# Patient Record
Sex: Male | Born: 1945 | Race: White | Hispanic: No | Marital: Married | Smoking: Former smoker
Health system: Southern US, Community
[De-identification: ages and names within clinical notes are randomized; demographics above are authoritative.]

## PROBLEM LIST (undated history)

## (undated) DIAGNOSIS — G63 Polyneuropathy in diseases classified elsewhere: Secondary | ICD-10-CM

## (undated) DIAGNOSIS — D518 Other vitamin B12 deficiency anemias: Secondary | ICD-10-CM

## (undated) DIAGNOSIS — I739 Peripheral vascular disease, unspecified: Secondary | ICD-10-CM

## (undated) DIAGNOSIS — I4901 Ventricular fibrillation: Secondary | ICD-10-CM

## (undated) DIAGNOSIS — H3530 Unspecified macular degeneration: Secondary | ICD-10-CM

## (undated) DIAGNOSIS — I10 Essential (primary) hypertension: Secondary | ICD-10-CM

## (undated) DIAGNOSIS — I779 Disorder of arteries and arterioles, unspecified: Secondary | ICD-10-CM

## (undated) DIAGNOSIS — E785 Hyperlipidemia, unspecified: Secondary | ICD-10-CM

## (undated) DIAGNOSIS — I255 Ischemic cardiomyopathy: Secondary | ICD-10-CM

## (undated) DIAGNOSIS — G250 Essential tremor: Secondary | ICD-10-CM

## (undated) DIAGNOSIS — I252 Old myocardial infarction: Secondary | ICD-10-CM

## (undated) DIAGNOSIS — I2510 Atherosclerotic heart disease of native coronary artery without angina pectoris: Secondary | ICD-10-CM

## (undated) HISTORY — DX: Polyneuropathy in diseases classified elsewhere: G63

## (undated) HISTORY — DX: Peripheral vascular disease, unspecified: I73.9

## (undated) HISTORY — DX: Unspecified macular degeneration: H35.30

## (undated) HISTORY — DX: Other vitamin B12 deficiency anemias: D51.8

## (undated) HISTORY — PX: CATARACT EXTRACTION: SUR2

## (undated) HISTORY — DX: Essential tremor: G25.0

## (undated) HISTORY — DX: Ischemic cardiomyopathy: I25.5

## (undated) HISTORY — DX: Hyperlipidemia, unspecified: E78.5

## (undated) HISTORY — DX: Ventricular fibrillation: I49.01

## (undated) HISTORY — DX: Disorder of arteries and arterioles, unspecified: I77.9

## (undated) HISTORY — DX: Old myocardial infarction: I25.2

## (undated) HISTORY — DX: Atherosclerotic heart disease of native coronary artery without angina pectoris: I25.10

## (undated) HISTORY — DX: Essential (primary) hypertension: I10

---

## 2003-03-28 ENCOUNTER — Encounter: Payer: Self-pay | Admitting: Gastroenterology

## 2003-03-28 ENCOUNTER — Encounter: Admission: RE | Admit: 2003-03-28 | Discharge: 2003-03-28 | Payer: Self-pay | Admitting: Gastroenterology

## 2003-04-26 ENCOUNTER — Encounter: Payer: Self-pay | Admitting: Gastroenterology

## 2003-04-26 ENCOUNTER — Ambulatory Visit (HOSPITAL_COMMUNITY): Admission: RE | Admit: 2003-04-26 | Discharge: 2003-04-26 | Payer: Self-pay | Admitting: Gastroenterology

## 2003-06-25 ENCOUNTER — Ambulatory Visit (HOSPITAL_COMMUNITY): Admission: RE | Admit: 2003-06-25 | Discharge: 2003-06-25 | Payer: Self-pay | Admitting: Gastroenterology

## 2003-06-25 ENCOUNTER — Encounter (INDEPENDENT_AMBULATORY_CARE_PROVIDER_SITE_OTHER): Payer: Self-pay

## 2007-08-04 DIAGNOSIS — I252 Old myocardial infarction: Secondary | ICD-10-CM

## 2007-08-04 HISTORY — DX: Old myocardial infarction: I25.2

## 2007-09-12 ENCOUNTER — Ambulatory Visit: Payer: Self-pay | Admitting: Internal Medicine

## 2007-09-12 ENCOUNTER — Ambulatory Visit: Payer: Self-pay | Admitting: Cardiovascular Disease

## 2007-09-12 ENCOUNTER — Encounter: Payer: Self-pay | Admitting: Critical Care Medicine

## 2007-09-12 ENCOUNTER — Inpatient Hospital Stay (HOSPITAL_COMMUNITY): Admission: EM | Admit: 2007-09-12 | Discharge: 2007-10-01 | Payer: Self-pay | Admitting: Emergency Medicine

## 2007-09-12 HISTORY — PX: CARDIAC CATHETERIZATION: SHX172

## 2007-09-14 ENCOUNTER — Encounter: Payer: Self-pay | Admitting: Critical Care Medicine

## 2007-09-20 ENCOUNTER — Ambulatory Visit: Payer: Self-pay | Admitting: Physical Medicine & Rehabilitation

## 2007-10-19 ENCOUNTER — Ambulatory Visit: Payer: Self-pay | Admitting: Cardiovascular Disease

## 2007-11-17 ENCOUNTER — Encounter: Payer: Self-pay | Admitting: Cardiovascular Disease

## 2007-11-17 ENCOUNTER — Ambulatory Visit (HOSPITAL_COMMUNITY): Admission: RE | Admit: 2007-11-17 | Discharge: 2007-11-17 | Payer: Self-pay | Admitting: Cardiovascular Disease

## 2007-11-17 ENCOUNTER — Ambulatory Visit: Payer: Self-pay | Admitting: Cardiology

## 2007-11-17 ENCOUNTER — Ambulatory Visit: Payer: Self-pay | Admitting: Cardiovascular Disease

## 2007-12-02 ENCOUNTER — Ambulatory Visit: Payer: Self-pay | Admitting: Cardiovascular Disease

## 2007-12-02 ENCOUNTER — Ambulatory Visit: Payer: Self-pay | Admitting: Internal Medicine

## 2007-12-02 LAB — CONVERTED CEMR LAB
Calcium: 9.1 mg/dL (ref 8.4–10.5)
Creatinine, Ser: 1 mg/dL (ref 0.4–1.5)
GFR calc Af Amer: 98 mL/min
GFR calc non Af Amer: 81 mL/min
HDL: 34.6 mg/dL — ABNORMAL LOW (ref >39.0)
LDL Cholesterol: 86 mg/dL (ref 0–99)
Total Bilirubin: 0.5 mg/dL (ref 0.3–1.2)
Total CHOL/HDL Ratio: 3.8
Triglycerides: 63 mg/dL (ref 0–149)
VLDL: 13 mg/dL (ref 0–40)

## 2008-01-06 ENCOUNTER — Ambulatory Visit: Payer: Self-pay | Admitting: Cardiovascular Disease

## 2008-04-03 ENCOUNTER — Ambulatory Visit: Payer: Self-pay | Admitting: Cardiovascular Disease

## 2008-04-03 LAB — CONVERTED CEMR LAB
AST: 20 units/L (ref 0–37)
Albumin: 3.4 g/dL — ABNORMAL LOW (ref 3.5–5.2)
BUN: 10 mg/dL (ref 6–23)
Cholesterol: 141 mg/dL (ref 0–200)
Creatinine, Ser: 1 mg/dL (ref 0.4–1.5)
GFR calc Af Amer: 98 mL/min
GFR calc non Af Amer: 81 mL/min
Glucose, Bld: 100 mg/dL — ABNORMAL HIGH (ref 70–99)
LDL Cholesterol: 91 mg/dL (ref 0–99)
Total Protein: 7.4 g/dL (ref 6.0–8.3)
Triglycerides: 113 mg/dL (ref 0–149)
VLDL: 23 mg/dL (ref 0–40)

## 2008-04-10 ENCOUNTER — Ambulatory Visit: Payer: Self-pay | Admitting: Cardiovascular Disease

## 2008-04-16 ENCOUNTER — Ambulatory Visit (HOSPITAL_COMMUNITY): Admission: RE | Admit: 2008-04-16 | Discharge: 2008-04-16 | Payer: Self-pay | Admitting: Cardiovascular Disease

## 2008-04-16 ENCOUNTER — Ambulatory Visit: Payer: Self-pay | Admitting: Cardiology

## 2008-05-09 ENCOUNTER — Ambulatory Visit: Payer: Self-pay | Admitting: Internal Medicine

## 2008-06-27 ENCOUNTER — Ambulatory Visit: Payer: Self-pay | Admitting: Internal Medicine

## 2008-06-27 LAB — CONVERTED CEMR LAB
Basophils Absolute: 0.1 10*3/uL (ref 0.0–0.1)
Basophils Relative: 1.2 % (ref 0.0–3.0)
CO2: 26 meq/L (ref 19–32)
Chloride: 109 meq/L (ref 96–112)
Creatinine, Ser: 0.9 mg/dL (ref 0.4–1.5)
GFR calc non Af Amer: 91 mL/min
Lymphocytes Relative: 29.8 % (ref 12.0–46.0)
MCHC: 34 g/dL (ref 30.0–36.0)
Neutrophils Relative %: 62.8 % (ref 43.0–77.0)
Platelets: 392 10*3/uL (ref 150–400)
Potassium: 4.3 meq/L (ref 3.5–5.1)
Prothrombin Time: 13.2 s (ref 10.9–13.3)
RBC: 4.37 M/uL (ref 4.22–5.81)
Sodium: 141 meq/L (ref 135–145)
aPTT: 29.5 s (ref 21.7–29.8)

## 2008-07-06 ENCOUNTER — Observation Stay (HOSPITAL_COMMUNITY): Admission: RE | Admit: 2008-07-06 | Discharge: 2008-07-07 | Payer: Self-pay | Admitting: Internal Medicine

## 2008-07-06 ENCOUNTER — Ambulatory Visit: Payer: Self-pay | Admitting: Internal Medicine

## 2008-07-06 HISTORY — PX: OTHER SURGICAL HISTORY: SHX169

## 2008-07-11 ENCOUNTER — Ambulatory Visit: Payer: Self-pay | Admitting: Cardiovascular Disease

## 2008-07-23 ENCOUNTER — Ambulatory Visit: Payer: Self-pay

## 2008-09-03 ENCOUNTER — Encounter: Payer: Self-pay | Admitting: Internal Medicine

## 2008-09-13 ENCOUNTER — Ambulatory Visit: Payer: Self-pay | Admitting: Cardiovascular Disease

## 2008-09-13 LAB — CONVERTED CEMR LAB
ALT: 20 units/L (ref 0–53)
AST: 20 units/L (ref 0–37)
Albumin: 3.5 g/dL (ref 3.5–5.2)
Cholesterol: 142 mg/dL (ref 0–200)
HDL: 40.5 mg/dL (ref >39.0)
Total Protein: 7.6 g/dL (ref 6.0–8.3)
Triglycerides: 114 mg/dL (ref 0–149)
VLDL: 23 mg/dL (ref 0–40)

## 2008-09-17 ENCOUNTER — Ambulatory Visit: Payer: Self-pay | Admitting: Cardiovascular Disease

## 2008-09-17 DIAGNOSIS — I2109 ST elevation (STEMI) myocardial infarction involving other coronary artery of anterior wall: Secondary | ICD-10-CM

## 2008-09-17 DIAGNOSIS — I2510 Atherosclerotic heart disease of native coronary artery without angina pectoris: Secondary | ICD-10-CM

## 2008-09-17 DIAGNOSIS — I4901 Ventricular fibrillation: Secondary | ICD-10-CM

## 2008-09-17 DIAGNOSIS — E785 Hyperlipidemia, unspecified: Secondary | ICD-10-CM | POA: Insufficient documentation

## 2008-09-17 DIAGNOSIS — I2589 Other forms of chronic ischemic heart disease: Secondary | ICD-10-CM

## 2008-10-16 ENCOUNTER — Ambulatory Visit: Payer: Self-pay | Admitting: Internal Medicine

## 2008-10-16 ENCOUNTER — Encounter: Payer: Self-pay | Admitting: Internal Medicine

## 2009-01-15 ENCOUNTER — Ambulatory Visit: Payer: Self-pay | Admitting: Internal Medicine

## 2009-02-04 ENCOUNTER — Encounter: Payer: Self-pay | Admitting: Internal Medicine

## 2009-02-11 ENCOUNTER — Telehealth (INDEPENDENT_AMBULATORY_CARE_PROVIDER_SITE_OTHER): Payer: Self-pay | Admitting: *Deleted

## 2009-02-20 ENCOUNTER — Telehealth: Payer: Self-pay | Admitting: Cardiovascular Disease

## 2009-02-22 IMAGING — CR DG CHEST 2V
2 series · 2 of 2 positions shown · non-contrast
Comparison: 09/19/2007

CLINICAL DATA: Heart attack

CHEST - 2 VIEW

[w chest pa]
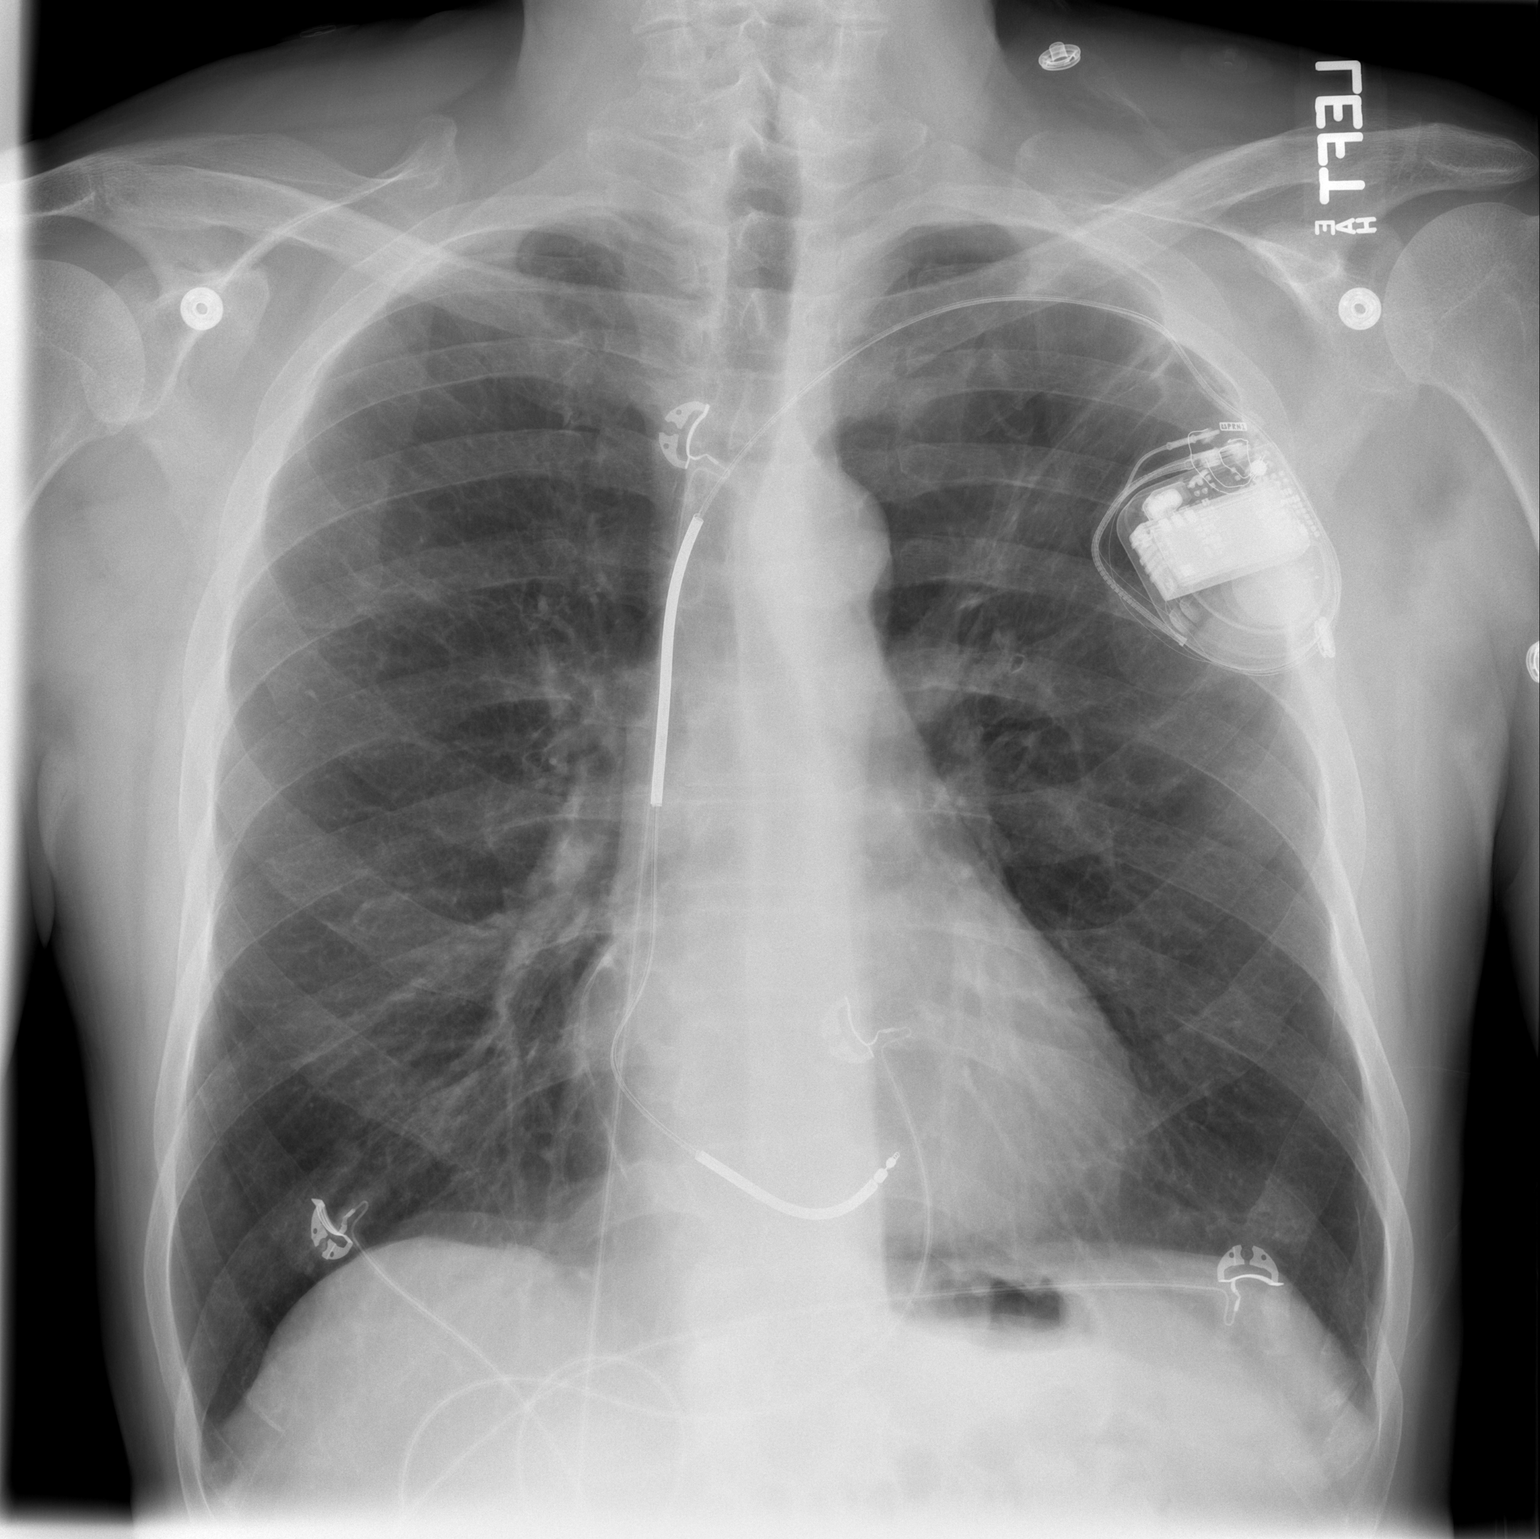

[w chest lat]
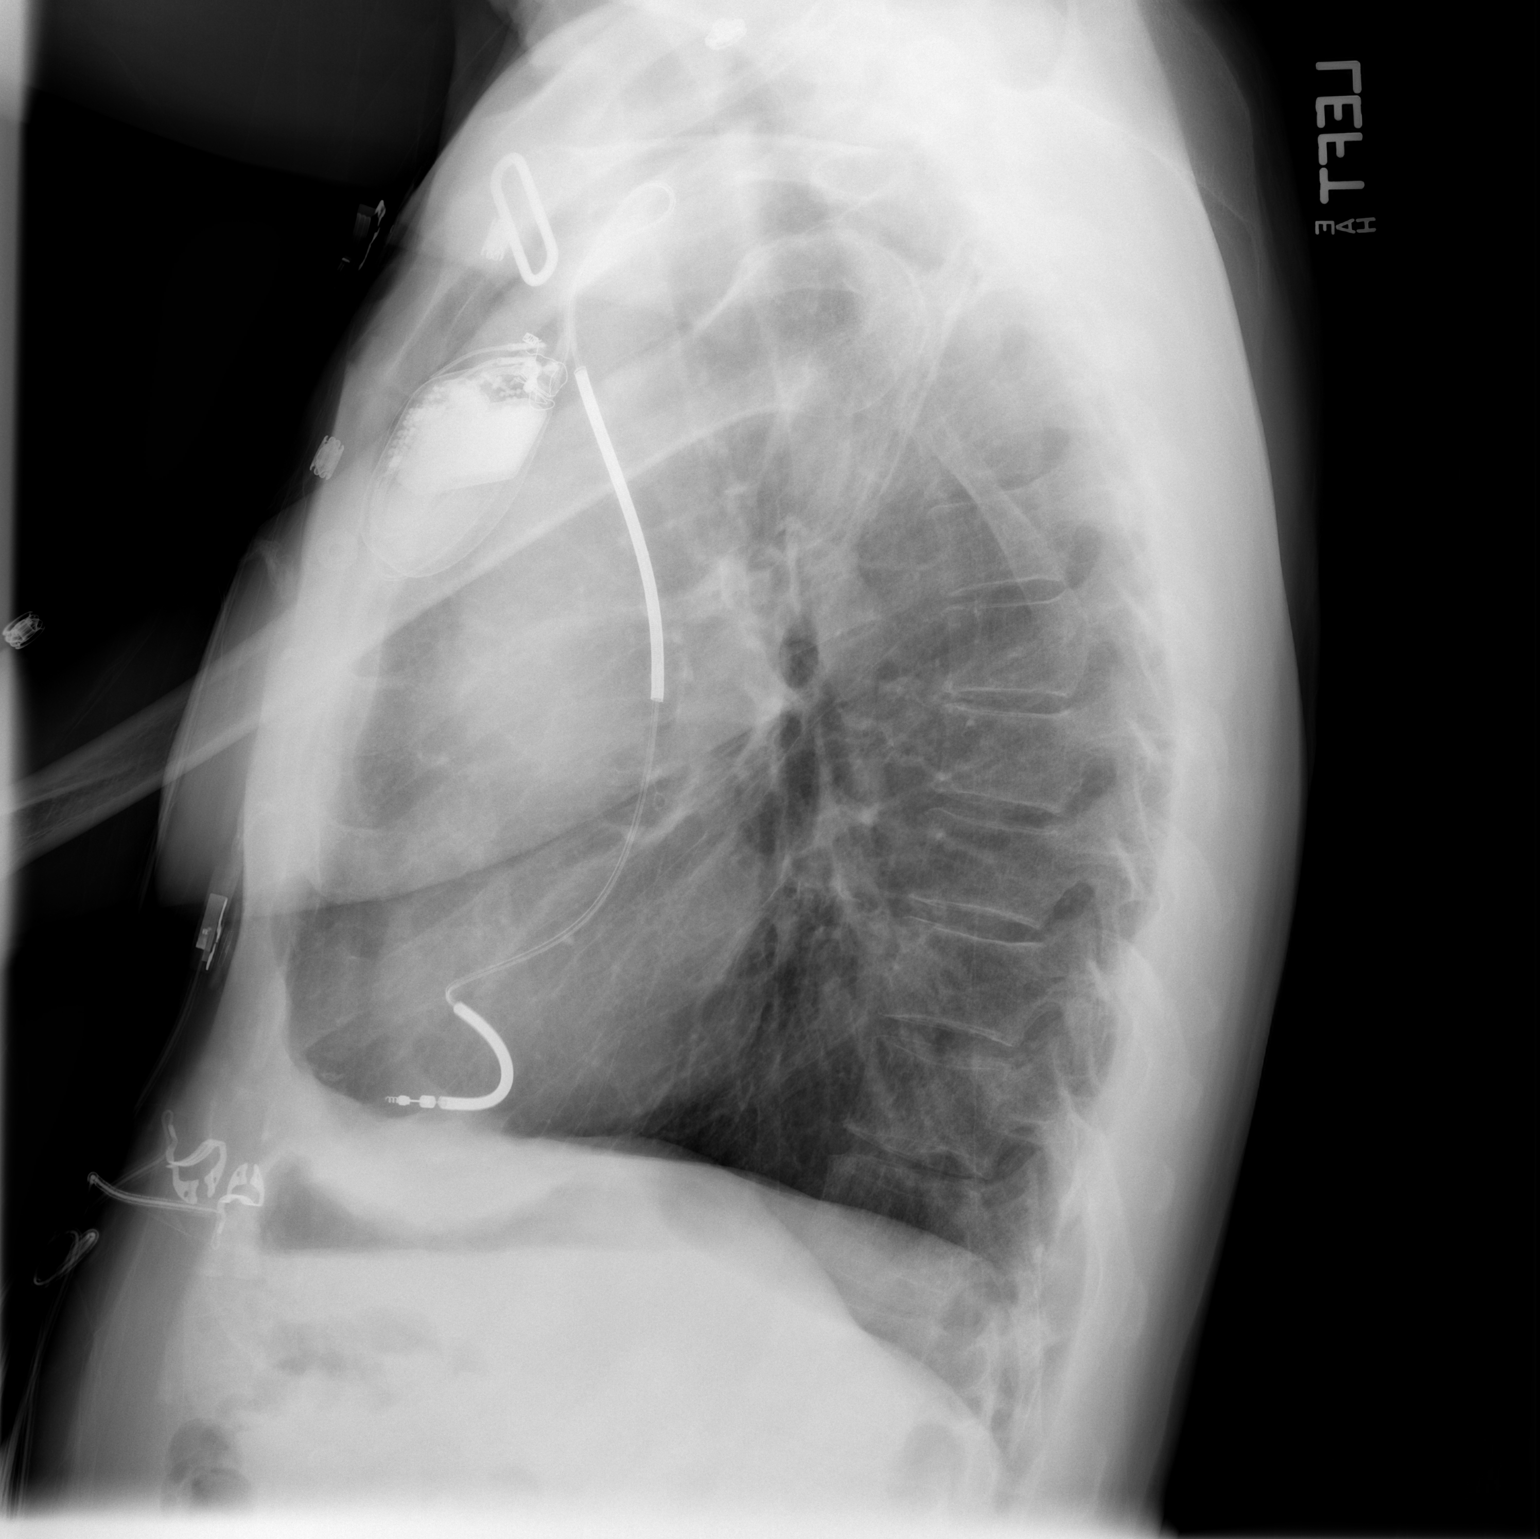

[2 of 2 positions shown; findings below may reference images not displayed]

FINDINGS: A cardiac pacer has been placed via the left subclavian
vein in the interval.  The lead is in satisfactory position.  No
pneumothorax.

Scarring within the left upper lung.  Negative for edema or
effusions.
IMPRESSION: 1.  Pacer is in satisfactory position without pneumothorax.
2.  Negative for pulmonary edema.

## 2009-03-25 ENCOUNTER — Ambulatory Visit: Payer: Self-pay | Admitting: Cardiovascular Disease

## 2009-04-04 ENCOUNTER — Telehealth: Payer: Self-pay | Admitting: Cardiovascular Disease

## 2009-04-15 ENCOUNTER — Telehealth: Payer: Self-pay | Admitting: Cardiovascular Disease

## 2009-04-23 ENCOUNTER — Encounter (INDEPENDENT_AMBULATORY_CARE_PROVIDER_SITE_OTHER): Payer: Self-pay | Admitting: *Deleted

## 2009-04-25 ENCOUNTER — Ambulatory Visit: Payer: Self-pay | Admitting: Internal Medicine

## 2009-07-08 ENCOUNTER — Ambulatory Visit: Payer: Self-pay | Admitting: Cardiovascular Disease

## 2009-07-22 ENCOUNTER — Telehealth (INDEPENDENT_AMBULATORY_CARE_PROVIDER_SITE_OTHER): Payer: Self-pay | Admitting: *Deleted

## 2009-08-06 ENCOUNTER — Telehealth: Payer: Self-pay | Admitting: Cardiovascular Disease

## 2009-09-03 ENCOUNTER — Ambulatory Visit: Payer: Self-pay | Admitting: Internal Medicine

## 2009-09-03 DIAGNOSIS — Z95810 Presence of automatic (implantable) cardiac defibrillator: Secondary | ICD-10-CM | POA: Insufficient documentation

## 2009-10-18 ENCOUNTER — Telehealth (INDEPENDENT_AMBULATORY_CARE_PROVIDER_SITE_OTHER): Payer: Self-pay | Admitting: *Deleted

## 2009-10-31 ENCOUNTER — Encounter: Payer: Self-pay | Admitting: Cardiovascular Disease

## 2009-11-18 ENCOUNTER — Telehealth: Payer: Self-pay | Admitting: Cardiovascular Disease

## 2009-12-03 ENCOUNTER — Encounter: Payer: Self-pay | Admitting: Internal Medicine

## 2009-12-13 ENCOUNTER — Encounter: Payer: Self-pay | Admitting: Internal Medicine

## 2010-01-03 ENCOUNTER — Ambulatory Visit: Payer: Self-pay | Admitting: Cardiovascular Disease

## 2010-01-15 ENCOUNTER — Telehealth: Payer: Self-pay | Admitting: Cardiovascular Disease

## 2010-02-10 ENCOUNTER — Telehealth (INDEPENDENT_AMBULATORY_CARE_PROVIDER_SITE_OTHER): Payer: Self-pay | Admitting: *Deleted

## 2010-03-06 ENCOUNTER — Ambulatory Visit: Payer: Self-pay | Admitting: Internal Medicine

## 2010-03-10 ENCOUNTER — Encounter: Payer: Self-pay | Admitting: Internal Medicine

## 2010-05-07 ENCOUNTER — Telehealth: Payer: Self-pay | Admitting: Cardiovascular Disease

## 2010-05-20 ENCOUNTER — Encounter: Payer: Self-pay | Admitting: Cardiovascular Disease

## 2010-06-05 ENCOUNTER — Ambulatory Visit: Payer: Self-pay | Admitting: Internal Medicine

## 2010-07-03 ENCOUNTER — Encounter (INDEPENDENT_AMBULATORY_CARE_PROVIDER_SITE_OTHER): Payer: Self-pay | Admitting: *Deleted

## 2010-07-15 ENCOUNTER — Ambulatory Visit (HOSPITAL_COMMUNITY)
Admission: RE | Admit: 2010-07-15 | Discharge: 2010-07-15 | Payer: Self-pay | Source: Home / Self Care | Attending: Cardiovascular Disease | Admitting: Cardiovascular Disease

## 2010-07-15 ENCOUNTER — Encounter: Payer: Self-pay | Admitting: Cardiovascular Disease

## 2010-07-15 ENCOUNTER — Ambulatory Visit: Payer: Self-pay | Admitting: Cardiovascular Disease

## 2010-07-15 DIAGNOSIS — R0989 Other specified symptoms and signs involving the circulatory and respiratory systems: Secondary | ICD-10-CM

## 2010-07-15 DIAGNOSIS — R0609 Other forms of dyspnea: Secondary | ICD-10-CM

## 2010-07-18 ENCOUNTER — Ambulatory Visit (HOSPITAL_COMMUNITY)
Admission: RE | Admit: 2010-07-18 | Discharge: 2010-07-18 | Payer: Self-pay | Source: Home / Self Care | Attending: Cardiovascular Disease | Admitting: Cardiovascular Disease

## 2010-07-18 ENCOUNTER — Encounter: Payer: Self-pay | Admitting: Cardiovascular Disease

## 2010-07-18 ENCOUNTER — Ambulatory Visit: Payer: Self-pay

## 2010-07-18 ENCOUNTER — Ambulatory Visit: Payer: Self-pay | Admitting: Cardiovascular Disease

## 2010-07-25 ENCOUNTER — Telehealth: Payer: Self-pay | Admitting: Cardiovascular Disease

## 2010-07-25 LAB — CONVERTED CEMR LAB
ALT: 28 units/L (ref 0–53)
AST: 24 units/L (ref 0–37)
Albumin: 3.6 g/dL (ref 3.5–5.2)
Basophils Absolute: 0 10*3/uL (ref 0.0–0.1)
Calcium: 8.9 mg/dL (ref 8.4–10.5)
Eosinophils Relative: 2.2 % (ref 0.0–5.0)
GFR calc non Af Amer: 68.81 mL/min (ref >60.00)
HCT: 42.3 % (ref 39.0–52.0)
HDL: 39.2 mg/dL (ref >39.00)
Hemoglobin: 14.6 g/dL (ref 13.0–17.0)
Lymphocytes Relative: 31 % (ref 12.0–46.0)
Lymphs Abs: 2.3 10*3/uL (ref 0.7–4.0)
Monocytes Relative: 7.5 % (ref 3.0–12.0)
Neutro Abs: 4.4 10*3/uL (ref 1.4–7.7)
Potassium: 4.2 meq/L (ref 3.5–5.1)
Pro B Natriuretic peptide (BNP): 250.7 pg/mL — ABNORMAL HIGH (ref 0.0–100.0)
RDW: 12.7 % (ref 11.5–14.6)
Sodium: 141 meq/L (ref 135–145)
Total Bilirubin: 0.9 mg/dL (ref 0.3–1.2)
Triglycerides: 125 mg/dL (ref 0.0–149.0)
VLDL: 25 mg/dL (ref 0.0–40.0)
WBC: 7.5 10*3/uL (ref 4.5–10.5)

## 2010-08-12 ENCOUNTER — Telehealth: Payer: Self-pay | Admitting: Cardiovascular Disease

## 2010-09-04 NOTE — Letter (Signed)
Summary: Professional Fee Billing  Professional Fee Billing   Imported By: Lenard Forth 07/25/2010 13:49:20  _____________________________________________________________________  External Attachment:    Type:   Image     Comment:   External Document

## 2010-09-04 NOTE — Progress Notes (Signed)
Summary: Question about medication  Phone Note Call from Patient Call back at Home Phone [phone] Call back at [phone]   Caller: Spouse/ Rhonda Summary of Call: Question about medication( DIgoxin) Initial call taken by: Judie Grieve,  November 18, 2009 2:06 PM  Follow-up for Phone Call        I spoke with the pt's wife and she noticed that the pt's instructions on his Digoxin bottle said to take one-half tablet daily.  The pt has always taken one tablet daily.  She wanted to know if our office changed his dosage.  I told her that we sent a Rx to [facility] on 10/16/09 for the pt to take one daily.  I did look back through the pt's medication list and he has one-half daily listed on some of his office medication lists. The pt states he has never taken a half tablet of Digoxin (pt is in the background).  I corrected the pt's medication list and instructed [name] to have the pt continue taking one tablet daily.   Follow-up by: Julieta Gutting, RN, BSN,  November 18, 2009 2:26 PM    New/Updated Medications: DIGOXIN 0.125 MG TABS (DIGOXIN) Take one tablet by mouth daily

## 2010-09-04 NOTE — Progress Notes (Signed)
Summary: need Plavix ordered  Phone Note Call from Patient Call back at Home Phone [phone]   Caller: Patient Summary of Call: Pt need his Plavix ordered Initial call taken by: Judie Grieve,  May 07, 2010 2:05 PM  Follow-up for Phone Call        Plavix ordered from [facility]. Medication will arrive in 7-10 business days.  Follow-up by: Julieta Gutting, RN, BSN,  May 07, 2010 2:10 PM     Appended Document: need Plavix ordered Plavix arrived in the office and placed at the front desk for pick-up. Message left at home number. Customer PO number [phone], order number [id number].

## 2010-09-04 NOTE — Progress Notes (Signed)
Summary: lab results  Phone Note Call from Patient Call back at [phone]   Caller: Patient Reason for Call: Talk to Nurse, Lab or Test Results Summary of Call: pt calling re echo/chest xray/blood work results  Initial call taken by: Roe Coombs,  July 25, 2010 9:55 AM  Follow-up for Phone Call        I spoke with the pt's wife and gave her the results of x-ray, labs and echo.  Per the pt's wife the pt has been eating foods that are high in salt.  I made her aware that this can cause him to retain fluid and increase his BP.  She has tried to tell the pt that he needs to exercise and change his diet but the pt does not want to make these changes.  Per the pt's lab results Dr Excell Seltzer would like to start the pt on Furosemide 40mg  daily.  New Rx sent to pharmacy.  Follow-up by: Julieta Gutting, RN, BSN,  July 25, 2010 10:37 AM    New/Updated Medications: FUROSEMIDE 40 MG TABS (FUROSEMIDE) take one tablet by mouth daily Prescriptions: FUROSEMIDE 40 MG TABS (FUROSEMIDE) take one tablet by mouth daily  #30 x 6   Entered by:   Julieta Gutting, RN, BSN   Authorized by:   Norva Karvonen, MD   Signed by:   Julieta Gutting, RN, BSN on 07/25/2010   Method used:   Electronically to        [facility] [phone]* (retail)       [address]       [place], [place]  [address]       Ph: [phone]       Fax: [phone]   RxID:   [id number]

## 2010-09-04 NOTE — Letter (Signed)
Summary: Remote Device Check  [facility], Main Office  [address]   [place], [place] [address]   Phone: [phone]  Fax: [phone]     Dec 13, 2009 MRN: [id number]   [name] [address] [place], [place]  [address]   Dear [name],   Your remote transmission was recieved and reviewed by your physician.  All diagnostics were within normal limits for you.  __X___Your next transmission is scheduled for:   03-06-10.  Please transmit at any time this day.  If you have a wireless device your transmission will be sent automatically.   Sincerely,  Vella Kohler

## 2010-09-04 NOTE — Progress Notes (Signed)
Summary: [facility] Plavix  Phone Note Refill Request Call back at Home Phone [phone] Message from:  Patient on August 12, 2010 9:18 AM  Refills Requested: Medication #1:  PLAVIX 75 MG TABS 1 by mouth once daily pt will pick rx. please call pt when rx is in.   Method Requested: Pick up at Office Initial call taken by: Roe Coombs,  August 12, 2010 9:18 AM  Follow-up for Phone Call        Plavix ordered from [facility].  Medication will arrive in 7-10 business days.  Follow-up by: Julieta Gutting, RN, BSN,  August 12, 2010 9:25 AM     Appended Document: [facility] Plavix Plavix arrived in the office and placed at the front desk for pick-up. Pt's wife aware. Order #[id number]

## 2010-09-04 NOTE — Cardiovascular Report (Signed)
Summary: Office Visit Remote   Office Visit Remote   Imported By: Roderic Ovens 03/10/2010 14:50:46  _____________________________________________________________________  External Attachment:    Type:   Image     Comment:   External Document

## 2010-09-04 NOTE — Progress Notes (Signed)
Summary: question re med strength  Phone Note From Pharmacy   Caller: [facility]  [address] [phone]* Request: Speak with Nurse Summary of Call: raisa w[facility] calling re a refill on carvedilol-pt has been getting 12.5 but the rx states 25mg -wants to make sure this is supposes to be increased Initial call taken by: Glynda Jaeger,  January 15, 2010 10:11 AM  Follow-up for Phone Call        The pt's coreg was increased at his most recent appt with Dr Excell Seltzer on 01/03/10.  The pt's pharmacy was notified that Rx is correct.  Follow-up by: Julieta Gutting, RN, BSN,  January 15, 2010 10:24 AM

## 2010-09-04 NOTE — Assessment & Plan Note (Signed)
Summary: f77m   Visit Type:  6 months follow up  CC:  No complaints.  History of Present Illness: This is a 65 year-old gentleman with CAD s/p large anterior MI complicated by out-of-hospital VF arrest. He underwent ICD placement in 2009.  Underwent myoview scan 2009 showing no ischemia. Overall he is doing well. He continues to have some problems with his balance, but has been able to remain active. No chest pain, edema, palps, or lightheadedness. He does have chronic dyspnea on exertion with moderate activity (Class II symptoms).  Current Medications (verified): 1)  Plavix 75 Mg Tabs (Clopidogrel Bisulfate) .Marland Kitchen.. 1 By Mouth Once Daily 2)  Digoxin 0.125 Mg Tabs (Digoxin) .... Take One Tablet By Mouth Daily 3)  Coreg 12.5 Mg Tabs (Carvedilol) .Marland Kitchen.. 1 By Mouth Two Times A Day 4)  Aspirin 81 Mg Tbec (Aspirin) .... Take One Tablet By Mouth Daily 5)  Simvastatin 40 Mg Tabs (Simvastatin) .... Take One Tablet By Mouth Daily At Bedtime 6)  Nitroglycerin 0.4 Mg Subl (Nitroglycerin) .... Place 1 Tablet Under Tongue As Directed 7)  Lisinopril 10 Mg Tabs (Lisinopril) .... Take One Tablet By Mouth Daily  Allergies: 1)  ! Niacin  Past History:  Past medical history reviewed for relevance to current acute and chronic problems.  Past Medical History: Reviewed history from 07/08/2009 and no changes required. HYPERLIPIDEMIA-MIXED (ICD-272.4) CARDIOMYOPATHY, ISCHEMIC (ICD-414.8), with severe residual LV dysfunction, LVEF less than 30%. 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 HEENT: normal Neck: normal carotid upstrokes without bruits, JVP normal Lungs: CTA CV: RRR without murmur or gallop Abd: soft, NT, positive BS, no bruit, no organomegaly Ext: no clubbing, cyanosis, or edema. peripheral pulses 2+ and equal Skin: warm and dry without rash    EKG  Procedure date:  01/03/2010  Findings:      NSR, HR 80 bpm, ST-T wave abnormality consider anterolateral ischemia.   ICD Specifications Following MD:  Lewayne Bunting, MD     ICD Vendor:  [employer]     ICD Model Number:  7232     ICD Serial Number:  [id number] ICD DOI:  07/06/2008     ICD Implanting MD:  Lewayne Bunting, MD  Lead 1:    Location: RV     DOI: 07/06/2008     Model #: [id number]     Serial #: [id number]     Status: active  Indications::  ICM   ICD Follow Up ICD Dependent:  No      Brady Parameters Mode VVI     Lower Rate Limit:  40      Tachy Zones VF:  214     VT:  250 FVT VIA VF     VT1:  176  Impression & Recommendations:  Problem # 1:  CARDIOMYOPATHY, ISCHEMIC (ICD-414.8) Pt stable, appears euvolemic. Increase carvedilol to 25 mg two times a day to achieve goal dose of beta blocker.  His updated medication list for this problem includes:    Plavix 75 Mg Tabs (Clopidogrel bisulfate) .Marland Kitchen... 1 by mouth once daily    Digoxin 0.125 Mg Tabs (Digoxin) .Marland Kitchen... Take one tablet by mouth daily    Carvedilol 25 Mg Tabs (Carvedilol) .Marland Kitchen... Take 1 tablet twice a day    Aspirin 81 Mg Tbec (Aspirin) .Marland Kitchen... Take one tablet by mouth daily    Nitroglycerin 0.4 Mg Subl (Nitroglycerin) .Marland Kitchen... Place 1 tablet under tongue as directed    Lisinopril 10 Mg Tabs (Lisinopril) .Marland Kitchen... Take one tablet by mouth daily  Problem # 2:  CAD, NATIVE VESSEL (ICD-414.01) No angina. Continue current medical therapy.  His updated  medication list for this problem includes:    Plavix 75 Mg Tabs (Clopidogrel bisulfate) .Marland Kitchen... 1 by mouth once daily    Carvedilol 25 Mg Tabs (Carvedilol) .Marland Kitchen... Take 1 tablet twice a day    Aspirin 81 Mg Tbec (Aspirin) .Marland Kitchen... Take one tablet by mouth daily    Nitroglycerin 0.4 Mg Subl (Nitroglycerin) .Marland Kitchen... Place 1 tablet under tongue as directed    Lisinopril 10 Mg Tabs (Lisinopril) .Marland Kitchen... Take one tablet by mouth daily  Orders: EKG w/ Interpretation (93[address])  Problem # 3:  VENTRICULAR FIBRILLATION (ICD-427.41) S/P ICD for primary prevention (initial event occured in setting of AMI). No ICD discharges.  His updated medication list for this problem includes:    Plavix 75 Mg Tabs (Clopidogrel bisulfate) .Marland Kitchen... 1 by mouth once daily    Carvedilol 25 Mg Tabs (Carvedilol) .Marland Kitchen... Take 1 tablet twice a day    Aspirin 81 Mg Tbec (Aspirin) .Marland Kitchen... Take one tablet by mouth daily    Nitroglycerin 0.4 Mg Subl (Nitroglycerin) .Marland Kitchen... Place 1 tablet under tongue as directed    Lisinopril 10 Mg Tabs (Lisinopril) .Marland Kitchen... Take one tablet by mouth daily  Problem # 4:  HYPERLIPIDEMIA-MIXED (ICD-272.4) Followed by PCP. Requested labs be sent for review next time they're drawn.  His updated medication list for this problem includes:    Simvastatin 40 Mg Tabs (Simvastatin) .Marland Kitchen... Take one tablet by mouth daily at bedtime  Patient Instructions: 1)  Your physician recommends that you schedule a follow-up appointment in: 6 months with Dr. Excell Seltzer 2)  Your physician has recommended you make the following change in your medication:  Coreg (Carvedilol) increased to 25mg   1 tablet twice a day Prescriptions: CARVEDILOL 25 MG TABS (CARVEDILOL) Take 1 tablet twice a day  #60 x 6   Entered by:   Lisabeth Devoid RN   Authorized by:   Norva Karvonen, MD   Signed by:   Lisabeth Devoid RN on 01/03/2010   Method used:   Electronically to        [id number] (retail)       [address]       [place], [place]  [address]       Ph: [phone]       Fax: [phone]   RxID:   [id number]

## 2010-09-04 NOTE — Letter (Signed)
Summary: [employer] Patient Product/process development [employer] Patient Assistance Application   Imported By: Roderic Ovens 11/14/2009 14:11:12  _____________________________________________________________________  External Attachment:    Type:   Image     Comment:   External Document

## 2010-09-04 NOTE — Progress Notes (Signed)
Summary: Plavix  Phone Note Outgoing Call   Call placed by: Julieta Gutting, RN, BSN,  August 06, 2009 2:05 PM Call placed to: Patient Summary of Call: I spoke with the pt's wife and made her aware that the pt's plavix had arrived.  Plavix placed at the front desk for pick-up.  Customer #[id number], Order # [id number]. Initial call taken by: Julieta Gutting, RN, BSN,  August 06, 2009 2:05 PM

## 2010-09-04 NOTE — Progress Notes (Signed)
Summary: refill meds  Phone Note Refill Request Call back at Home Phone [phone] Call back at [phone] Message from:  Patient on October 18, 2009 11:17 AM  Refills Requested: Medication #1:  PLAVIX 75 MG TABS 1 by mouth once daily pt get assistance for 90 days supply.    Method Requested: Fax to Mail Away Pharmacy Initial call taken by: Lorne Skeens,  October 18, 2009 11:18 AM  Follow-up for Phone Call        [name] requires a new form filled out per patient with proof of income . Unable to reach pt. Message left at answer machine to call me back. Pt's wife called. Pt. will be coming on Monday to fill out form. He will bring copies of last in[employer]tax Follow-up by: Vikki Ports,  October 18, 2009 2:08 PM     Appended Document: refill meds Plavix arrived in the office.  I left a message at the pt's home number that this medication is at the front desk for pick-up. Order #[id number], Customer PO  number [phone].

## 2010-09-04 NOTE — Assessment & Plan Note (Signed)
Summary: f38m   Visit Type:  Follow-up Primary Provider:  none  CC:  High blood pressure past 2-3- days.  Left foot numbness.  History of Present Illness: This is a 65 year-old gentleman with CAD s/p large anterior MI complicated by out-of-hospital VF arrest. He underwent ICD placement in 2009.  Underwent myoview scan 2009 showing no ischemia.   BP cuff at home has been reading high, but also with abnormal readings on his wife's BP...thinks the machine is bad.  He is doing well from a symptomatic standpoint. Able to mow his lawn and rake leaves with some rest breaks. No chest pain, edema, orthopnea, or PND. He does complain of shortness of breath, worse in the mornings. This has progressed over the past year.  Current Medications (verified): 1)  Plavix 75 Mg Tabs (Clopidogrel Bisulfate) .Marland Kitchen.. 1 By Mouth Once Daily 2)  Digoxin 0.125 Mg Tabs (Digoxin) .... Take One Tablet By Mouth Daily 3)  Carvedilol 25 Mg Tabs (Carvedilol) .... Take 1 Tablet Twice A Day 4)  Aspirin 81 Mg Tbec (Aspirin) .... Take One Tablet By Mouth Daily 5)  Simvastatin 40 Mg Tabs (Simvastatin) .... Take One Tablet By Mouth Daily At Bedtime 6)  Nitroglycerin 0.4 Mg Subl (Nitroglycerin) .... Place 1 Tablet Under Tongue As Directed 7)  Lisinopril 10 Mg Tabs (Lisinopril) .... Take One Tablet By Mouth Daily  Allergies: 1)  ! Niacin  Past History:  Past medical history reviewed for relevance to current acute and chronic problems.  Past Medical History: Reviewed history from 07/08/2009 and no changes required. HYPERLIPIDEMIA-MIXED (ICD-272.4) CARDIOMYOPATHY, ISCHEMIC (ICD-414.8), with severe residual LV dysfunction, LVEF less than 30%. 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 fib arrest Implantable Cardiac Defibrillator VENTRICULAR FIBRILLATION (ICD-427.41)  Review of Systems       Negative except as per HPI   Vital  Signs:  Patient profile:   65 year old male Height:      71 inches Weight:      173 pounds BMI:     24.22 Pulse rate:   74 / minute Pulse rhythm:   regular Resp:     18 per minute BP sitting:   142 / 74  (left arm) Cuff size:   large  Vitals Entered By: Vikki Ports (July 15, 2010 9:06 AM)  Physical Exam  General:  Pt is alert and oriented, in no acute distress. HEENT: normal Neck: normal carotid upstrokes without bruits, JVP normal Lungs: CTA CV: RRR without murmur or gallop Abd: soft, NT, positive BS, no bruit, no organomegaly Ext: no clubbing, cyanosis, or edema. peripheral pulses 2+ and equal Skin: warm and dry without rash    EKG  Procedure date:  07/15/2010  Findings:      NSR with ST-T changes consider lateral ischemia, HR 74   ICD Specifications Following MD:  Lewayne Bunting, MD     ICD Vendor:  [employer]tronic     ICD Model Number:  7232     ICD Serial Number:  [id number] ICD DOI:  07/06/2008     ICD Implanting MD:  Lewayne Bunting, MD  Lead 1:    Location: RV     DOI: 07/06/2008     Model #: [id number]     Serial #: [id number]     Status: active  Indications::  ICM   ICD Follow Up ICD Dependent:  No      Brady Parameters Mode VVI  Lower Rate Limit:  40      Tachy Zones VF:  214     VT:  250 FVT VIA VF     VT1:  176     Impression & Recommendations:  Problem # 1:  CAD, NATIVE VESSEL (ICD-414.01) Appears stable without angina. He has had followup Myoview stress testing demonstrating prior infarct without significant ischemia. Continue current medical program.  His updated medication list for this problem includes:    Plavix 75 Mg Tabs (Clopidogrel bisulfate) .Marland Kitchen... 1 by mouth once daily    Carvedilol 25 Mg Tabs (Carvedilol) .Marland Kitchen... Take 1 tablet twice a day    Aspirin 81 Mg Tbec (Aspirin) .Marland Kitchen... Take one tablet by mouth daily    Nitroglycerin 0.4 Mg Subl (Nitroglycerin) .Marland Kitchen... Place 1 tablet under tongue as directed    Lisinopril 20 Mg Tabs (Lisinopril) .Marland Kitchen...  Take one tablet by mouth daily  Problem # 2:  HYPERLIPIDEMIA-MIXED (ICD-272.4) Will order followup lipids and lft's. He is on statin Rx as below.  His updated medication list for this problem includes:    Simvastatin 40 Mg Tabs (Simvastatin) .Marland Kitchen... Take one tablet by mouth daily at bedtime  CHOL: 142 (09/13/2008)   LDL: 79 (09/13/2008)   HDL: 40.5 (09/13/2008)   TG: 114 (09/13/2008)  Problem # 3:  OTHER DYSPNEA AND RESPIRATORY ABNORMALITIES (ICD-786.09) Exam does not suggest volume overload, but he is at risk for CHF with severe residual LV dysfunction after anterior MI. Will repeat an echocardiogram and add a BNP to his labwork to further evaluate. Also will increase lisinopril to 20 mg daily.  His updated medication list for this problem includes:    Digoxin 0.125 Mg Tabs (Digoxin) .Marland Kitchen... Take one tablet by mouth daily    Carvedilol 25 Mg Tabs (Carvedilol) .Marland Kitchen... Take 1 tablet twice a day    Aspirin 81 Mg Tbec (Aspirin) .Marland Kitchen... Take one tablet by mouth daily    Lisinopril 20 Mg Tabs (Lisinopril) .Marland Kitchen... Take one tablet by mouth daily  Orders: T-2 View CXR (71020TC) Echocardiogram (Echo)  Other Orders: EKG w/ Interpretation (93[address])  Patient Instructions: 1)  Your physician recommends that you schedule a follow-up appointment in: 6 months 2)  Your physician recommends that you return for a FASTING lipid profile,BMP,BNP, hepatic, CBC --428.22 3)  A chest x-ray takes a picture of the organs and structures inside the chest, including the heart, lungs, and blood vessels. This test can show several things, including, whether the heart is enlarged; whether fluid is building up in the lungs; and whether pacemaker / defibrillator leads are still in place. 4)  Your physician has requested that you have an echocardiogram.  Echocardiography is a painless test that uses sound waves to create images of your heart. It provides your doctor with information about the size and shape of your heart and how well  your heart's chambers and valves are working.  This procedure takes approximately one hour. There are no restrictions for this procedure. 5)  Your physician has recommended you make the following change in your medication: Increase lisinopril to 20 mg by mouth daily Prescriptions: LISINOPRIL 20 MG TABS (LISINOPRIL) Take one tablet by mouth daily  #30 x 6   Entered by:   Dossie Arbour, RN, BSN   Authorized by:   Norva Karvonen, MD   Signed by:   Dossie Arbour, RN, BSN on 07/15/2010   Method used:   Electronically to        [facility] [phone]* (retail)       [address]       [place], [place]  [address]       Ph: [phone]       Fax: [phone]   RxID:   [id number]

## 2010-09-04 NOTE — Letter (Signed)
Summary: Remote Device Check  [facility] Main [facility]  [address]   [place], [place] [address]   Phone: [phone]  Fax: [phone]     March 10, 2010 MRN: [id number]   [facility] [address] [place], [place]  [address]   Dear Mr. BOTTO,   Your remote transmission was recieved and reviewed by your physician.  All diagnostics were within normal limits for you.  __X___Your next transmission is scheduled for:   06-05-2010.  Please transmit at any time this day.  If you have a wireless device your transmission will be sent automatically.   Sincerely,  Vella Kohler

## 2010-09-04 NOTE — Assessment & Plan Note (Signed)
Summary: 9 MO F/U MEDTRONIC   History of Present Illness: This is a 65 year-old gentleman with CAD s/p large anterior MI complicated by out-of-hospital VF arrest. He underwent ICD placement in 2009.  Underwent myoview scan 2009 showing no ischemia. His main symptom that problem has been related to poor balance. He denies cardiovascular symptoms.  He walks approximately 2 miles daily without chest discomfort or dyspnea. He denies orthopnea, PND, edema, or palpitations. He has had no intercurrent ICD shocks.    Current Medications (verified): 1)  Plavix 75 Mg Tabs (Clopidogrel Bisulfate) .Marland Kitchen.. 1 By Mouth Once Daily 2)  Digoxin 0.125 Mg Tabs (Digoxin) .... Take A Half  Tablet By Mouth Daily 3)  Coreg 12.5 Mg Tabs (Carvedilol) .Marland Kitchen.. 1 By Mouth Two Times A Day 4)  Aspirin 81 Mg Tbec (Aspirin) .... Take One Tablet By Mouth Daily 5)  Simvastatin 40 Mg Tabs (Simvastatin) .... Take One Tablet By Mouth Daily At Bedtime 6)  Nitroglycerin 0.4 Mg Subl (Nitroglycerin) .... Place 1 Tablet Under Tongue As Directed 7)  Lisinopril 10 Mg Tabs (Lisinopril) .... Take One Tablet By Mouth Daily  Allergies: 1)  ! Niacin  Past History:  Past Medical History: Last updated: 07/08/2009 HYPERLIPIDEMIA-MIXED (ICD-272.4) CARDIOMYOPATHY, ISCHEMIC (ICD-414.8), with severe residual LV dysfunction, LVEF less than 30%. 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 fib arrest Implantable Cardiac Defibrillator VENTRICULAR FIBRILLATION (ICD-427.41)  Past Surgical History: Last updated: 09/17/2008 ICD medtronic implanted 07/06/2008 Dr Lewayne Bunting  Review of Systems  The patient denies chest pain, syncope, dyspnea on exertion, and peripheral edema.    Vital Signs:  Patient profile:   65 year old male Height:      71 inches Weight:      173 pounds Pulse rate:   92 / minute BP sitting:   150 / 82  (left arm)  Vitals Entered  By: Laurance Flatten CMA (September 03, 2009 2:53 PM)  Physical Exam  General:  Pt is alert and oriented, in no acute distress. HEENT: normal Neck: normal carotid upstrokes without bruits, JVP normal Lungs: CTA. Well healed ICD incision. CV: RRR without murmur or gallop Abd: soft, NT, positive BS, no bruit, no organomegaly Ext: no clubbing, cyanosis, or edema. peripheral pulses 2+ and equal Skin: warm and dry without rash     ICD Specifications Following MD:  Lewayne Bunting, MD     ICD Vendor:  Medtronic     ICD Model Number:  7232     ICD Serial Number:  [id number] ICD DOI:  07/06/2008     ICD Implanting MD:  Lewayne Bunting, MD  Lead 1:    Location: RV     DOI: 07/06/2008     Model #: [id number]     Serial #: [id number]     Status: active  Indications::  ICM   ICD Follow Up Remote Check?  No Battery Voltage:  3.19 V     Charge Time:  7.74 seconds     Underlying rhythm:  ST ICD Dependent:  No       ICD Device Measurements Right Ventricle:  Amplitude: 9.8 mV, Impedance: 632 ohms, Threshold: 1.0 V at 0.2 msec Shock Impedance: 50/63 ohms   Episodes Shock:  0     ATP:  0     Nonsustained:  3     Ventricular Pacing:  <0.1%  Brady Parameters Mode VVI     Lower Rate Limit:  40  Tachy Zones VF:  214     VT:  250 FVT VIA VF     VT1:  176     Next Remote Date:  12/03/2009     Next Cardiology Appt Due:  09/03/2010 Tech Comments:  Normal device function.  RV output decreased to 2.5V today.  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 Gypsy Balsam RN BSN  September 03, 2009 2:57 PM  MD Comments:  Agree with above.  Impression & Recommendations:  Problem # 1:  AUTOMATIC IMPLANTABLE CARDIAC DEFIBRILLATOR SITU (ICD-V45.02) His device is working normally.  I will see him back in several months.  Problem # 2:  CARDIOMYOPATHY, ISCHEMIC (ICD-414.8) He denies anginal symptoms.  Continue current meds. His updated medication list for this problem includes:    Plavix 75 Mg Tabs (Clopidogrel  bisulfate) .Marland Kitchen... 1 by mouth once daily    Digoxin 0.125 Mg Tabs (Digoxin) .Marland Kitchen... Take a half  tablet by mouth daily    Coreg 12.5 Mg Tabs (Carvedilol) .Marland Kitchen... 1 by mouth two times a day    Aspirin 81 Mg Tbec (Aspirin) .Marland Kitchen... Take one tablet by mouth daily    Nitroglycerin 0.4 Mg Subl (Nitroglycerin) .Marland Kitchen... Place 1 tablet under tongue as directed    Lisinopril 10 Mg Tabs (Lisinopril) .Marland Kitchen... Take one tablet by mouth daily  Problem # 3:  HYPERLIPIDEMIA-MIXED (ICD-272.4) The importance of exercise and a low fat diet have been discussed. His updated medication list for this problem includes:    Simvastatin 40 Mg Tabs (Simvastatin) .Marland Kitchen... Take one tablet by mouth daily at bedtime  Patient Instructions: 1)  Your physician recommends that you schedule a follow-up appointment in:12 months with Dr Ladona Ridgel

## 2010-09-04 NOTE — Letter (Signed)
Summary: Remote Device Check  [facility], Main Office  [address]   [place], [place] [address]   Phone: [phone]  Fax: [phone]     July 03, 2010 MRN: [id number]   [name] [address] [place], [place]  [address]   Dear Mr. TOREN,   Your remote transmission was recieved and reviewed by your physician.  All diagnostics were within normal limits for you.  _____Your next transmission is scheduled for:                       .  Please transmit at any time this day.  If you have a wireless device your transmission will be sent automatically.  ___X___Your next office visit is scheduled for:February 2012 with Dr. Ladona Ridgel.                              . Please call our office to schedule an appointment.    Sincerely,  Altha Harm, LPN

## 2010-09-04 NOTE — Progress Notes (Signed)
Summary: Pt need Plavix ordered  Phone Note Refill Request Message from:  Patient on February 10, 2010 9:00 AM  Refills Requested: Medication #1:  PLAVIX 75 MG TABS 1 by mouth once daily Pt need Plavix ordered  Initial call taken by: Judie Grieve,  February 10, 2010 9:00 AM  Follow-up for Phone Call        Plavix ordered will arrive between the next 5-10 days Follow-up by: Vikki Ports,  February 10, 2010 10:34 AM     Appended Document: Pt need Plavix ordered Plavix arrived in the office and placed at the front desk for pick-up.  Order #[id number], Customer  PO [phone].  Pt's wife aware that medication has arrived.

## 2010-09-04 NOTE — Cardiovascular Report (Signed)
Summary: Office Visit Remote   Office Visit Remote   Imported By: Roderic Ovens 12/20/2009 15:17:47  _____________________________________________________________________  External Attachment:    Type:   Image     Comment:   External Document

## 2010-10-14 ENCOUNTER — Encounter (INDEPENDENT_AMBULATORY_CARE_PROVIDER_SITE_OTHER): Payer: Self-pay | Admitting: Internal Medicine

## 2010-10-14 ENCOUNTER — Encounter: Payer: Self-pay | Admitting: Internal Medicine

## 2010-10-14 DIAGNOSIS — I472 Ventricular tachycardia: Secondary | ICD-10-CM

## 2010-10-14 DIAGNOSIS — I2589 Other forms of chronic ischemic heart disease: Secondary | ICD-10-CM

## 2010-10-17 ENCOUNTER — Telehealth: Payer: Self-pay | Admitting: Cardiovascular Disease

## 2010-10-21 NOTE — Cardiovascular Report (Signed)
Summary: Office Visit   Office Visit   Imported By: Roderic Ovens 10/15/2010 11:53:42  _____________________________________________________________________  External Attachment:    Type:   Image     Comment:   External Document

## 2010-10-21 NOTE — Progress Notes (Signed)
Summary: Complete Plavix Assistance form  Phone Note Call from Patient Call back at Home Phone [phone]   Caller: Patient Summary of Call: Pt needs Plavix ordered Initial call taken by: Judie Grieve,  October 17, 2010 8:40 AM  Follow-up for Phone Call        I spoke with the pt's wife and made her aware that the pt is due to complete his BMS application at this time.  She would like the application mailed to her.  Application placed in outgoing mail.  Follow-up by: Julieta Gutting, RN, BSN,  October 17, 2010 10:02 AM

## 2010-10-21 NOTE — Assessment & Plan Note (Signed)
Summary: pc2./cy   Visit Type:  Follow-up Primary Provider:  none   History of Present Illness: This is a 65 year-old gentleman with CAD s/p large anterior MI complicated by out-of-hospital VF arrest. He underwent ICD placement in 2009.  Underwent myoview scan 2009 showing no ischemia.  He is doing well from a symptomatic standpoint. Able to mow his lawn and rake leaves with some rest breaks. No chest pain, edema, orthopnea, or PND. His sob has improved with lasix. No ICD shocks.  Current Medications (verified): 1)  Plavix 75 Mg Tabs (Clopidogrel Bisulfate) .Marland Kitchen.. 1 By Mouth Once Daily 2)  Digoxin 0.125 Mg Tabs (Digoxin) .... Take One Tablet By Mouth Daily 3)  Carvedilol 25 Mg Tabs (Carvedilol) .... Take 1 Tablet Twice A Day 4)  Aspirin 81 Mg Tbec (Aspirin) .... Take One Tablet By Mouth Daily 5)  Simvastatin 40 Mg Tabs (Simvastatin) .... Take One Tablet By Mouth Daily At Bedtime 6)  Nitroglycerin 0.4 Mg Subl (Nitroglycerin) .... Place 1 Tablet Under Tongue As Directed 7)  Lisinopril 20 Mg Tabs (Lisinopril) .... Take One Tablet By Mouth Daily 8)  Furosemide 40 Mg Tabs (Furosemide) .... Take One Tablet By Mouth Daily  Allergies: 1)  ! Niacin  Past History:  Past Medical History: Last updated: 07/08/2009 HYPERLIPIDEMIA-MIXED (ICD-272.4) CARDIOMYOPATHY, ISCHEMIC (ICD-414.8), with severe residual LV dysfunction, LVEF less than 30%. 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 fib arrest Implantable Cardiac Defibrillator VENTRICULAR FIBRILLATION (ICD-427.41)  Past Surgical History: Last updated: 09/17/2008 ICD medtronic implanted 07/06/2008 Dr Lewayne Bunting  Review of Systems  The patient denies chest pain, syncope, dyspnea on exertion, and peripheral edema.    Vital Signs:  Patient profile:   65 year old male Height:      71 inches Weight:      175 pounds BMI:     24.50 Pulse rate:    76 / minute BP sitting:   110 / 52  (right arm)  Vitals Entered By: Laurance Flatten CMA (October 14, 2010 9:45 AM)  Physical Exam  General:  Pt is alert and oriented, in no acute distress. HEENT: normal Neck: normal carotid upstrokes without bruits, JVP normal Lungs: CTA. Well healed ICD incision. CV: RRR without murmur or gallop Abd: soft, NT, positive BS, no bruit, no organomegaly Ext: no clubbing, cyanosis, or edema. peripheral pulses 2+ and equal Skin: warm and dry without rash     ICD Specifications Following MD:  Lewayne Bunting, MD     ICD Vendor:  Medtronic     ICD Model Number:  7232     ICD Serial Number:  [id number] ICD DOI:  07/06/2008     ICD Implanting MD:  Lewayne Bunting, MD  Lead 1:    Location: RV     DOI: 07/06/2008     Model #: [id number]     Serial #: [id number]     Status: active  Indications::  ICM   ICD Follow Up ICD Dependent:  No      Brady Parameters Mode VVI     Lower Rate Limit:  40      Tachy Zones VF:  214     VT:  250 FVT VIA VF     VT1:  176     MD Comments:  Normal device function.  Impression & Recommendations:  Problem # 1:  AUTOMATIC IMPLANTABLE CARDIAC DEFIBRILLATOR SITU (ICD-V45.02) His device is working normally. Will recheck in several  months.  Problem # 2:  VENTRICULAR FIBRILLATION (ICD-427.41) He has had asymptomatic NSVT. Will continue his current meds and followup with Dr. Excell Seltzer. No ICD shocks. His updated medication list for this problem includes:    Plavix 75 Mg Tabs (Clopidogrel bisulfate) .Marland Kitchen... 1 by mouth once daily    Carvedilol 25 Mg Tabs (Carvedilol) .Marland Kitchen... Take 1 tablet twice a day    Aspirin 81 Mg Tbec (Aspirin) .Marland Kitchen... Take one tablet by mouth daily    Nitroglycerin 0.4 Mg Subl (Nitroglycerin) .Marland Kitchen... Place 1 tablet under tongue as directed    Lisinopril 20 Mg Tabs (Lisinopril) .Marland Kitchen... Take one tablet by mouth daily  Problem # 3:  CARDIOMYOPATHY, ISCHEMIC (ICD-414.8) No c/p.  Continue meds as below and his current exercise routine. His  updated medication list for this problem includes:    Plavix 75 Mg Tabs (Clopidogrel bisulfate) .Marland Kitchen... 1 by mouth once daily    Digoxin 0.125 Mg Tabs (Digoxin) .Marland Kitchen... Take one tablet by mouth daily    Carvedilol 25 Mg Tabs (Carvedilol) .Marland Kitchen... Take 1 tablet twice a day    Aspirin 81 Mg Tbec (Aspirin) .Marland Kitchen... Take one tablet by mouth daily    Nitroglycerin 0.4 Mg Subl (Nitroglycerin) .Marland Kitchen... Place 1 tablet under tongue as directed    Lisinopril 20 Mg Tabs (Lisinopril) .Marland Kitchen... Take one tablet by mouth daily    Furosemide 40 Mg Tabs (Furosemide) .Marland Kitchen... Take one tablet by mouth daily

## 2010-11-11 ENCOUNTER — Telehealth: Payer: Self-pay

## 2010-11-11 NOTE — Telephone Encounter (Signed)
I left a message on the pt's home phone that Plavix has arrived in the office. Plavix placed at the front desk for pick-up. Order #[id number], Customer PO #[phone].

## 2010-11-28 ENCOUNTER — Other Ambulatory Visit: Payer: Self-pay | Admitting: Cardiovascular Disease

## 2010-12-16 NOTE — Discharge Summary (Signed)
[name], [name]              ACCOUNT NO.:  [id number]   MEDICAL RECORD NO.:  [id number]          PATIENT TYPE:  INP   LOCATION:  3731                         FACILITY:  [facility]   PHYSICIAN:  Veverly Fells. Excell Seltzer, MD  DATE OF BIRTH:  [birth year]   DATE OF ADMISSION:  09/12/2007  DATE OF DISCHARGE:  09/30/2007                               DISCHARGE SUMMARY   PRIMARY CARDIOLOGIST:  Veverly Fells. Excell Seltzer, MD.   CONSULTANTS:  1. Marlan Palau, M.D.  2. GI, Dr. Bernette Redbird.   ADMISSION DIAGNOSIS:  Acute anterior myocardial infarction.   SECONDARY DIAGNOSES:  1. Coronary artery disease requiring percutaneous coronary      intervention and stenting of the proximal left anterior descending      with placement of 3 x18 mm Vision bare metal stent.  2. Ventricular fibrillation arrest/sudden cardiac death.  3. Cardiogenic shock.  4. Acute systolic congestive heart failure.  5. Ischemic cardiomyopathy with ejection fraction of 20% by 2-D echo      September 14, 2007.  6. Anoxic encephalopathy treated with hypothymia protocol.  7. Brief episode of atrial fibrillation with rapid ventricular      response in the setting of cardiopulmonary resuscitation.  8. Anemia.  9. Dysphagia and aspiration requiring n.p.o. and placement of      percutaneous endoscopic gastrostomy tube September 29, 2007.  10.Tobacco abuse.  11.Hyperlipidemia.  12.EtOH abuse.  13.Acute anterior myocardial infarction.  14.History of esophageal ring requiring Savory dilatation September      2004 and November 2004.   ALLERGIES:  No known drug allergies.   PROCEDURES:  1. Left heart cardiac catheterization with successful PCI and stenting      of proximal LAD with bare metal stent as outlined above.  2. A 2-D echocardiogram revealing EF of 20% with diffuse LV      hypokinesis with regional variations.  3. CT of head which was negative.  4. Fluoroscopic swallow study.  5. Panda tube placement.  6. PEG tube  placement.   HISTORY OF PRESENT ILLNESS:  A 65 year old married Caucasian male  without prior cardiac history.  He was in his usual state of health on  September 12, 2007, when he was noted by his wife to fall approximately  5:00 a.m., slumping to the floor unconscious.  9-1-1 was activated and  upon their arrival, he was noted to be in ventricular fibrillation.  An  AED was placed and he received one defibrillation shocking him into  sinus bradycardia.  Subsequently received atropine in the field and was  taken to the [facility] ED.  In the ED, the patient  was found be in atrial fibrillation with RVR and was intubated.  Transvenous pacemaker was also placed.  He was then taken to the CCU for  further evaluation.   HOSPITAL COURSE:  Once in the coronary intensive care unit, the patient  was hypotensive and diltiazem was discontinued and replaced with  Levophed infusion.  He was evaluated by critical care medicine and  repeat ECG showed anterior ST-segment elevation as well as anterolateral  Qs.  Cardiology consulted and the patient was taken emergently to the  cardiac cath lab.  Left heart cardiac catheterization revealed a 99%  stenosis in the proximal LAD with right to left collaterals.  He  otherwise had nonobstructive disease.  EF was 20% with large area of  anteroapical akinesis.  The proximal LAD was successfully stented with 3  x 18 mm Vision bare metal stent.  Postprocedure, the patient was taken  back to the coronary intensive care unit where he was initiated on the  hypothermia protocol by the critical care medicine team.  He was not  initially placed on beta blocker or ACE inhibitor therapy secondary to  hypotension and bradycardia.  A 2-D echocardiogram was performed on  September 12, 2007, showing an EF of 15-20% with severe diffuse LV  hypokinesis.  Echo was repeated September 14, 2007, showing EF 20%.  Given presumed anoxic encephalopathy, neurology was  consulted on  September 14, 2007.  CT of the head was performed and was negative for  acute process.  Hypothermia protocol was weaned off and the patient was  extubated September 15, 2007.  Following weaning of sedation and  extubation, the patient became more alert with improved cognition and  mental status.  A bedside swallow study was performed September 15, 2007,  and it was felt that the patient could tolerate honey thickened liquids  with full supervision.  The patient's BNP began to climb and was also  noted to have edema on chest x-ray requiring intravenous Lasix  initiation.  The patient diuresed well and eventually Lasix was  discontinued.  The patient was transferred out to the floor on September 19, 2007, and began to ambulate with cardiac rehab.  Initially, he  required a fair amount of redirection and assistance, however, he has  steadily improved.  He was reevaluated by speech pathology who felt that  patient was not tolerating his current diet and recommended repeating  swallow evaluation under fluoroscopy.  This was performed September 21, 2007, and the patient was noted to aspirate all consistencies.  He was  subsequently made n.p.o. and a Panda tube was placed to provide him with  medications and nutrition.  With his history of an esophageal ring, Dr.  Buccini's team was consulted and they felt that his swallowing issues  were likely not esophageal in nature and more likely secondary to anoxic  encephalopathy and intubation trauma.  Follow-up barium swallow was  performed September 26, 2007, showing severe oropharyngeal dysphasia of  unknown etiology.  It was recommended that we seek more permanent  alternate means of nutrition.  GI followed up and recommended  percutaneous placement of a G-tube.  The patient was evaluated by  interventional radiology and on September 29, 2007, underwent successful  PEG tube placement.  At the recommendation of nutrition, he has been   started on Jevity 1.2 formula initially at 20 mL an hour, advanced to 10  mL every 6-8 hours to a goal of 60 mL per hour.   With regards to the patient's ischemic cardiomyopathy, following  diuresis he has clinically improved, however, he was noted to be  hypotensive on both beta blocker and ACE inhibitor therapy.  His beta-  blocker required downward titration while his ACE inhibitor had to be  discontinued.   Finally, with his multiple medical problems it was felt that Mr. Eichholz  would benefit most from at least short-term rehabilitation/skilled  nursing.  He has been evaluated and has been accepted  for placement.  He  will be discharged today in good condition.   DISCHARGE LABORATORY DATA:  Hemoglobin 11.6, hematocrit 34.9, WBC 6.74,  platelets 468.  INR 1.1.  Sodium 138, potassium 4.1, chloride 106, CO2  21, BUN 12, creatinine 0.95, glucose 69, calcium 8.8.  Total bilirubin  0.8, alkaline phosphatase 93, AST 33, ALT 20, total protein 5.5, albumin  2.4, calcium 9.1, magnesium 1.8.  Hemoglobin A1c 5.2.  Peak CK 513, peak  MB 33.5, peak troponin-I 1.37.  BNP September 28, 2007, was 444 down from  2456 on September 16, 2007.  Blood cultures were negative x2.   DISPOSITION:  The patient is being discharged to a skilled nursing  facility in good condition.   FOLLOW UP PLANS AND APPOINTMENTS:  We have arranged for follow-up with  Dr. Tonny Bollman at [facility] on September 21, 2007, at 9:30  a.m.  The patient is encouraged to establish primary care follow-up.   DISCHARGE MEDICATIONS:  1. Aspirin 325 mg daily.  2. Plavix 75 mg daily.  3. Zocor 40 mg nightly.  4. Digoxin 0.125 mg daily.  5. Coreg 3.125 mg b.i.d.  All these medications are via PEG tube.  1. Neosporin ointment daily to PEG site.  2. Jevity 1.2 formula 60 mL per hour.  3. Nitroglycerin 0.4 mg sublingual p.r.n. chest pain.   Patient has been counseled on the importance of smoking cessation.   OUTSTANDING  LABORATORY STUDIES:  None.   DURATION OF DISCHARGE ENCOUNTER:  90 minutes including physician time.      Nicolasa Ducking, ANP      Veverly Fells. Excell Seltzer, MD  Electronically Signed    CB/MEDQ  D:  09/30/2007  T:  09/30/2007  Job:  [id number]   cc:   Bernette Redbird, M.D.

## 2010-12-16 NOTE — Discharge Summary (Signed)
NAMEPEIGHTON, [name]              ACCOUNT NO.:  [id number]   MEDICAL RECORD NO.:  [id number]          PATIENT TYPE:  INP   LOCATION:  3731                         FACILITY:  [facility]   PHYSICIAN:  Doylene Canning. Ladona Ridgel, MD    DATE OF BIRTH:  12-17-1945   DATE OF ADMISSION:  07/06/2008  DATE OF DISCHARGE:  07/07/2008                               DISCHARGE SUMMARY   This dictation, greater than 35 minutes.  The patient has no known drug  allergies, but it is listed that he has an intolerance to ACE  INHIBITORS.  Apparently, he is currently on one.   FINAL DIAGNOSIS:  Discharging day 1 status post implantation of a  Medtronic Maximo VR single-chamber cardioverter defibrillator.  Defibrillator threshold study, less than or equal to 15 joules, Dr.  Lewayne Bunting.   SECONDARY DIAGNOSES:  1. History in February 2009 of out of hospital cardiac arrest      (ventricular fibrillation).  2. Concurrent ST-segment elevation myocardial infarction, February      2009/cardiogenic shock.  3. Emergent catheterization with bare-metal stent to an ulcerated      plaque and a 99% ostial left anterior descending stenosis.  The      left circumflex and the right coronary artery were nonobstructive      of disease, ejection fraction at that time 20%.  4. Echocardiogram in April 2009, ejection fraction remains severely      depressed at 20-25%.  5. Ischemic cardiomyopathy.  6. New York Heart Association Class II-III chronic systolic congestive      heart failure.  7. Ongoing tobacco habituation.  8. Dyslipidemia.  9. Ethanol substance abuse.   PROCEDURE:  On July 06, 2008, implant of the Medtronic single-chamber  cardioverter defibrillator by Dr. Lewayne Bunting.  The patient has done  well in the postprocedure.  He is maintaining sinus rhythm.  He will  have a chest x-ray on postprocedure day #1 to check for pneumothorax and  to make sure the lead is in appropriate position.  His device will be  interrogated  to make sure that the values are within normal limits.  He  is going to discharge on his prehospital medications.  Incision care and  mobility of the left arm will be discussed with the patient.   The patient is discharged on the following medications:  1. Plavix 75 mg daily.  2. Enteric-coated aspirin 81 mg daily.  3. Digoxin 0.125 mg daily.  4. Lisinopril 5 mg daily.  5. Simvastatin 40 mg daily at bedtime.  6. Coreg 12.5 mg twice daily.   He follows up with [facility]:  1. To see Dr. Excell Seltzer on Wednesday, July 11, 2008, at 2:15 p.m.  2. ICD Clinic on Monday, July 23, 2008, at 9:20 p.m.  3. To see Dr. Ladona Ridgel on Tuesday, October 16, 2008, at 2:30 p.m.   BRIEF HISTORY:  Mr. Lamons is a 65 year old male who was first seen in  February 2009 when he presented with an ST-elevation myocardial  infarction in the setting of ventricular fibrillation, out of hospital  cardiac arrest.  Emergent catheterization  showed that he had ulcerated  plaque in the ostial LAD.  This was successfully stented with a bare-  metal stent.  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  A cardioverter  defibrillator had been considered and would be implanted for this  patient but for the fact that he is not adequately covered with  insurance in the meantime since February to this date, anticipating  implantation.  The patient has been placed on disability, and we have  successfully obtained a donor device for the patient.  The patient is  aware of the risks and benefits of implantation.  He wishes to proceed.  This will be done on the first available opportunity.   HOSPITAL COURSE:  The patient presents electively on July 06, 2008.  He underwent implantation of the Medtronic single-chamber pacemaker.  It  is a Medtronic Maximo VR device the same day.  He is ready for discharge  on postprocedure day #1.      Maple Mirza, PA      Doylene Canning. Ladona Ridgel, MD  Electronically Signed    GM/MEDQ  D:  07/06/2008  T:  07/07/2008  Job:  [id number]   cc:   Veverly Fells. Excell Seltzer, MD

## 2010-12-16 NOTE — Assessment & Plan Note (Signed)
[facility]                         ELECTROPHYSIOLOGY OFFICE NOTE   NAME:[name], [name]                     MRN:          [id number]  DATE:05/09/2008                            DOB:          03/22/1946    [name] returns today for followup.  I saw him back several months  ago on referral by Dr. Excell Seltzer for prophylactic ICD insertion.  At that  time, because of concerns about the cost of the device decided he would  not have it done, but has recently been granted disability and  apparently by his report received a 100% disk out from the hospital as a  consequence of his disability.  He is referred today for additional  evaluation and reconsider for prophylactic defibrillator implant.  He  has had no syncope.   His medicines include:  1. Plavix 75 a day.  2. Digoxin 0.125 a day.  3. Coreg 12.5 twice daily.  4. Aspirin 81 mg daily.  5. Lisinopril 5 a day.  6. Simvastatin 40 a day.   PHYSICAL EXAMINATION:  GENERAL:  He is a pleasant, well-appearing man in  no acute distress.  VITAL SIGNS:  Blood pressure was 117/60, the pulse 79 and regular, the  respirations were 18, and the weight was 168 pounds.  NECK:  No jugular venous distention.  LUNGS:  Clear bilaterally to auscultation.  No wheezes, rales, or  rhonchi are present.  There was no increased work of breathing.  CARDIAC:  Regular rate and rhythm and normal S1 and S2.  There is a soft  S4 gallop present  EXTREMITIES:  Demonstrate no edema.   IMPRESSION:  1. Ischemic cardiomyopathy.  2. Congestive heart failure.  3. Dyslipidemia.  4. Hypertension.   DISCUSSION:  [name] is stable but has had no symptomatic  arrhythmias but clearly is at increased risk for sudden cardiac death.  I discussed treatment options with the patient.  He is initially  proceeding with an implantable cardioverter-defibrillator implantation.  Because he has  no insurance, we will see about the options for  getting him a device  donated.  This can all be  accomplished, and we will schedule him for defibrillator insertion in  the next week or two or three.     Doylene Canning. Ladona Ridgel, MD  Electronically Signed    GWT/MedQ  DD: 05/09/2008  DT: 05/09/2008  Job #: [id number]

## 2010-12-16 NOTE — Assessment & Plan Note (Signed)
[facility]                            CARDIOLOGY OFFICE NOTE   NAME:[name], [name] KINLEY                     MRN:          [id number]  DATE:09/17/2008                            DOB:          1945/10/06    REASON FOR VISIT:  Chronic systolic heart failure.   HISTORY OF PRESENT ILLNESS:  [name] is a 65 year old gentleman with  underlying CAD who sustained an anterior wall MI 1 year ago.  His MI was  complicated by out of hospital V-fib arrest and prolonged  hospitalization.  He was found to have critical proximal LAD stenosis  and was treated with a bare-metal stent.  He has some severe residual LV  dysfunction with an LVEF less than 30% and he therefore underwent ICD  implantation in December 2009.  He presents today for followup.   Overall, [name] is doing well from a symptomatic standpoint.  He  has intermittent episodes of shortness of breath, occur low-level  activities such as taking a shower.  He can do his normal daily  activities without problems.  He has been less active because of the  cold weather.  He has been less active of late because of the cold  weather.  He denies chest pain, orthopnea, PND, palpitations,  lightheadedness, syncope, or edema.  Prior to the weather turning that,  he maintained a walking program for exercise.   MEDICATIONS:  1. Plavix 75 mg daily.  2. Digoxin 0.125 mg daily.  3. Coreg 12.5 mg twice daily.  4. Aspirin 81 mg daily.  5. Lisinopril 5 mg daily.  6. Simvastatin 40 mg daily.   ALLERGIES:  NKDA.   PAST MEDICAL HISTORY:  1. Coronary artery disease, status post PCI with bare-metal stent to      the proximal LAD.  2. Severe ischemic cardiomyopathy with LVEF less than 30%, status post      ICD implant.  3. Dyslipidemia.  4. Prior history of tobacco abuse.   PHYSICAL EXAMINATION:  GENERAL:  The patient is alert and oriented.  He  is in no acute distress.  VITAL SIGNS:  Weight is 166 pounds,  blood pressure 122/70, heart rate  80, and respiratory rates 12.  HEENT:  Normal.  NECK:  Normal carotid upstrokes.  No bruits.  JVP normal.  LUNGS:  Clear bilaterally.  CHEST:  Equal expansion.  HEART:  Regular rate and rhythm.  No murmurs or gallops.  ABDOMEN:  Soft and nontender.  No organomegaly.  EXTREMITIES:  No clubbing, cyanosis, or edema.  Peripheral pulses intact  and equal.  SKIN:  Warm and dry without rash.   ASSESSMENT AND PLAN:  1. Chronic systolic heart failure secondary to severe underlying      ischemic cardiomyopathy.  [name] has New York Heart      Association class II symptoms.  We will increase lisinopril from 5-      10 mg since his blood pressure has stabilized and he is having no      episodes of dizziness or lightheadedness.  Continue Coreg 12.5 mg      twice  daily.  We will consider doubling his Coreg to goal dose to      25 mg twice daily at his next office visit.  Continue digoxin 0.125      mg daily.  I encouraged him to resume his walking program as soon      as the weather prevents.  2. Coronary artery disease, status post anterior wall myocardial      infarction in out of hospital ventricular fibrillation arrest.  The      patient is having no angina.  He underwent a Myoview scan in      September 2009, that showed anterior infarct with no ischemia.  The      quantitative left ventricular ejection fraction was 21%.  Continue      medical therapy with aspirin and Plavix.  3. Dyslipidemia.  The patient remains on simvastatin 40 mg.  He tried      to take Niaspan, but had severe flushing after his third day of      medication and he did best discontinued it.  Lipids which was      checked on September 13, 2008, showed a total cholesterol of 142,      triglycerides 114, HDL 41, and LDL 79.  I have difficult to explain      the big change in his HDL, which went up from 27-41.  I am pleased      with his lipids and we will continue his current  therapy.  4. Status post implantable cardioverter-defibrillator.  The patient      will follow up with Dr. Ladona Ridgel next month.  There had been no      device discharges.  5. Followup, I will see [name] back in 6 months.     Veverly Fells. Excell Seltzer, MD  Electronically Signed    MDC/MedQ  DD: 09/17/2008  DT: 09/17/2008  Job #: [id number]

## 2010-12-16 NOTE — Assessment & Plan Note (Signed)
[facility]                            CARDIOLOGY OFFICE NOTE   NAME:[name]                     MRN:          [id number]  DATE:11/17/2007                            DOB:          05/01/1946    [name] returns for follow-up at the [facility] cardiology office on  November 17, 2007.  [name] is a very nice 65 year old gentleman who  had an anterior MI and out of hospital cardiac arrest on February 9.  He  underwent angioplasty and stenting of the proximal LAD and had a  prolonged hospitalization with respiratory failure and anoxic brain  injury.  He has had a remarkable recovery and is back home with his  wife.  [name] is really making good progress from a clinical  standpoint.  He has had no chest pain or dyspnea.  He does complain of  generalized fatigue.  Otherwise he has no specific complaints.  He  reports an episode where he was raising his grandson up to put him in a  shopping cart and his legs gave out.  He fell to the ground but he did  not have syncope. He has had no other episodes of lightheadedness or  dizziness.   MEDICATIONS:  1. Aspirin 325 mg daily.  2. Plavix 75 mg daily.  3. Simvastatin 40 mg at bedtime.  4. Digoxin 0.125 mg daily.  5. Coreg 6.25 mg twice daily.   ALLERGIES:  NKDA.   PHYSICAL EXAMINATION:  GENERAL:  He is alert and oriented, thin male in  no acute distress.  VITAL SIGNS:  Weight is 140 pounds, blood pressure 110/56, heart rate  84, respiratory rate 16.  HEENT:  Normal.  NECK:  Normal carotid upstrokes without bruits.  Jugulovenous pressure  is normal.  LUNGS:  Clear bilaterally.  HEART:  Regular rate and rhythm without murmurs or gallops.  ABDOMEN:  Soft, nontender no organomegaly.  EXTREMITIES:  No clubbing, cyanosis or edema.  Peripheral pulses are 2+  and equal throughout.   Echocardiogram was performed in the hospital today.  It has not been  officially interpreted but by my review,  his LVEF remains in the range  of 20%.  There is marked global LV dysfunction with anteroapical  akinesis.   ASSESSMENT:  1. Anterior myocardial infarction with severe ischemic cardiomyopathy.      While [name] has made good progress from a symptomatic      standpoint, he has residual marked left ventricular dysfunction.      His blood pressure does not allow for aggressive medicine      titration, but I do think he could likely tolerate the addition of      a low-dose ACE at this point.  He was ACE intolerant in the      hospital due to symptomatic hypotension.  He has gained some weight      and his blood pressure seems to be better since his hospital stay.      Will give him a trial of 2.5 mg lisinopril daily in addition to his  Coreg dose.  Otherwise continue aspirin and Plavix.  2. Severe ischemic cardiomyopathy following anterior myocardial      infarction.  [name] LVEF is severely depressed by echo.  I      think his chance of any significant recovery of left ventricular      function at this point is quite low.  Continue medical therapy as      outlined above. Refer to EP for consideration of prophylactic ICD      for primary prevention of sudden cardiac death.  3. Dyslipidemia.  Continue simvastatin 40 mg.  Follow-up lipids and      LFTs in 2 weeks.  4. Return to work.  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  I think his      chances of realistically being able to return to work in that      capacity are quite low considering his severe cardiac disease after      a large anterior wall myocardial infarction.  I advised him and his      wife that I would be happy to help them with disability as I      believe this is medically indicated.   For follow-up I would like to see [name] back in 6 weeks.  In the  interim, I will obtain an EP consultation as detailed above.     Veverly Fells. Excell Seltzer, MD  Electronically Signed    MDC/MedQ  DD: 11/17/2007  DT: 11/17/2007   Job #: [id number]   cc:   Almedia Balls

## 2010-12-16 NOTE — Assessment & Plan Note (Signed)
[facility]                         ELECTROPHYSIOLOGY OFFICE NOTE   NAME:[name], [name]                     MRN:          [id number]  DATE:12/02/2007                            DOB:          04/08/1946    REFERRING PHYSICIAN:  Veverly Fells. Excell Seltzer, MD   Mr. Helbing is referred today by Dr. Calton Dach for consideration for  prophylactic ICD insertion.  The patient is a very pleasant middle-aged  man (62) who has history of known coronary disease status post large  anterior myocardial infarction which he sustained back in February 2009.  At that time, he presented late with an anterior MI and despite  successful stenting, had persistence of his congestive heart failure in  cardiogenic shock.  He ultimately improved.  He developed some  encephalopathy secondary to a peri-infarction.  The patient, after  stabilization and stenting, was placed on medical therapy with aspirin  325 a day, Plavix 75 a day, Zocor 40 a day, digoxin 0.125 daily, Coreg  6.25 twice daily.  HE IS INTOLERANT OF ACE INHIBITORS.  He has never had  syncope.  He is referred today for consideration for prophylactic  defibrillator implantation.  His additional past medical history is  really unremarkable.  He does have dyslipidemia.   FAMILY HISTORY:  Otherwise, unremarkable.   SOCIAL HISTORY:  The patient denies tobacco abuse, stopping smoking in  February when he had his MI.  Prior to that, he was a heavy smoker, up  to 2 packs a day.  He does admit to drinking perhaps 5-6 alcoholic  beverages per week, sometimes less, sometimes more.   REVIEW OF SYSTEMS:  Notable for some arthritic symptoms.  He has class  II heart failure, at times approaching class III.  He has generalized  fatigue and weakness.  He has occasional episodes of shortness of  breath.  He is fairly weak with exertion.   PHYSICAL EXAMINATION:  He is a pleasant middle-aged man in no acute  distress.  Blood pressure  was 110/43.  The pulse was 72 and regular.  The respirations were 18, and the weight was 144 pounds.  HEENT:  Normocephalic and atraumatic.  Pupils equal and round.  Oropharynx moist.  Sclerae anicteric.  NECK:  No jugular venous distention, no thyromegaly.  Trachea was  midline.  Carotids were 2+ and symmetric.  LUNGS:  Clear bilaterally to auscultation.  No wheezes, rales or rhonchi  are present.  CARDIOVASCULAR:  Regular rate and rhythm.  Normal S1 and S2.  PMI was  enlarged and laterally displaced.  I did not appreciate any murmurs.  ABDOMINAL:  Soft, nontender, nondistended.  There was no organomegaly.  EXTREMITIES:  No cyanosis, clubbing, or edema.  The pulses are 2+ and  symmetric.   EKG demonstrates sinus rhythm with anteroseptal MI pattern.   IMPRESSION:  1. Ischemic cardiomyopathy status post myocardial infarction (massive)      with ejection fraction residual, ejection fraction of 20%.  2. Congestive heart failure, class II-III.  3. History of tobacco abuse.   DISCUSSION:  The risks, benefits, goals, and expectations of  prophylactic ICD  insertion have been discussed with the patient and his  wife.  He wishes to proceed.  The patient will be scheduled at the  earliest possible convenient time.  It is my expectation that the  patient's heart failure and LV dysfunction will only worsens as time  goes on.     Doylene Canning. Ladona Ridgel, MD  Electronically Signed    GWT/MedQ  DD: 12/02/2007  DT: 12/02/2007  Job #: [id number]

## 2010-12-16 NOTE — Assessment & Plan Note (Signed)
[facility]                            CARDIOLOGY OFFICE NOTE   NAME:[name], [name]                     MRN:          [id number]  DATE:04/10/2008                            DOB:          1945-09-06    REASON FOR VISIT:  Followup CAD with previous anterior wall MI.   [name] returns today for followup.  He is a 65 year old gentleman  who had an out of hospital cardiac arrest in February.  He had this  occurred in the setting of an anterior wall MI, and he ultimately  underwent PCI of the proximal LAD.  He has had full neurologic recovery.  He has a residual LV dysfunction with his followup echo demonstrating an  LVEF of 20-25%.   From a symptomatic standpoint, he is doing relatively well.  He has  good days and bad days.  On his good days, he is able to mow the lawn  and do yard work.  He does some walking for exercise as well.  However,  on some days, he has exertional dyspnea and a feeling of pressure in the  chest.  This is not a frequent occurrence but clearly is happening at  times.  He has no resting symptoms.  He denies edema, orthopnea, or PND.   MEDICATIONS:  1. Plavix 75 mg daily.  2. Zocor 40 mg at bedtime.  3. Digoxin 0.125 mg daily.  4. Coreg 12.5 mg twice daily.  5. Lisinopril 5 mg daily.  6. Aspirin 81 mg daily.   ALLERGIES:  NKDA.   PHYSICAL EXAMINATION:  GENERAL:  The patient is alert and oriented.  He  is in no acute distress.  VITAL SIGNS:  Weight 160 pounds, blood pressure 126/70, heart rate 75,  and respiratory rate 12.  HEENT:  Normal.  NECK:  Normal carotid upstrokes.  No bruits.  JVP normal.  LUNGS:  Clear bilaterally.  HEART:  Regular rate and rhythm.  No murmurs or gallops.  ABDOMEN:  Soft, nontender, no organomegaly.  EXTREMITIES:  No clubbing, cyanosis, or edema.  Peripheral pulses are  intact and equal.   EKG shows sinus rhythm with age indeterminate anteroseptal MI and  diffuse ST-T wave abnormality  suggestive of inferolateral ischemia.   Metabolic panel from April 03, 2008, showed a creatinine of 1.0,  glucose of 100, potassium 4.1, and sodium 142.  LFTs were normal,  cholesterol 141, triglycerides 113, HDL 27, and LDL 91.   ASSESSMENT:  1. Coronary artery disease status post anterior myocardial infarction.      Continue medical therapy with Coreg, lisinopril, digoxin, aspirin,      and Plavix.  I am concerned about [name]'s exertional      symptoms, even though they are not occurring on a daily basis.      This is a new complaint.  He has a bare metal stent in the proximal      left anterior descending, which was placed at the time of his      infarct.  We will follow up with an exercise Myoview stress scan to  rule out significant ischemia.  2. Severe ischemic cardiomyopathy.  He appears euvolemic on exam.  His      functional capacity is relatively good.  The patient has seen Dr.      Ladona Ridgel who has recommended for ICD implantation for primary      prevention of sudden cardiac death.  Mr. Moga has lost his      insurance after qualifying for disability.  He did not followup      because of concerns over cost.  I am going to set him up to see Dr.      Ladona Ridgel back in the office to see what options are available.  3. Dyslipidemia.  Most notably low HDL and cholesterol.  For now, we      will continue Zocor 40 mg.   For followup, we will review the stress Myoview scan as soon as it is  available.  Follow up with Dr. Ladona Ridgel as noted.  I would like to see the  patient back in 4 months.     Veverly Fells. Excell Seltzer, MD  Electronically Signed    MDC/MedQ  DD: 04/11/2008  DT: 04/12/2008  Job #: [id number]

## 2010-12-16 NOTE — Cardiovascular Report (Signed)
[name], [name]              ACCOUNT NO.:  [id number]   MEDICAL RECORD NO.:  [id number]          PATIENT TYPE:  INP   LOCATION:  2906                         FACILITY:  [facility]   PHYSICIAN:  Veverly Fells. Excell Seltzer, MD  DATE OF BIRTH:  12/16/1945   DATE OF PROCEDURE:  09/12/2007  DATE OF DISCHARGE:                            CARDIAC CATHETERIZATION   PROCEDURE:  1. Left heart catheterization.  2. Selective coronary angiography.  3. Left ventricular angiography.  4. Percutaneous transluminal cardiac angioplasty and stenting of the      proximal left anterior descending.   INDICATIONS:  [name] is a 65 year old gentleman who presented with  an out-of-hospital cardiac arrest.  He was asleep next to his wife in  bed at approximately 4:30 this morning when she heard him fall to the  ground.  He was unresponsive, and EMS was called immediately.  Upon  their arrival, he was in ventricular fibrillation, and he was  defibrillated and resuscitated with CPR.  He regained a pulse and was  transferred emergently to the hospital.  After he stabilized, his EKG  showed anterolateral ST elevation, and the catheterization lab was  activated.  I reviewed the risks and indications of emergent  catheterization in this situation in detail with the patient's wife and  his son.  While the chances of his neurologic recovery are still  unclear, we elected to proceed with hopes that he would recover  neurologically.   Right groin was prepped, draped, and anesthetized with 1% lidocaine.  Using modified Seldinger technique, a 6-French sheath was placed in the  right femoral artery.  A Wholly wire was used for access as it was  moderately difficult to pass a wire into the vessel.  Standard 6-French  Judkins catheters were used for coronary angiography.  Just after  diagnostic angiography, I elected to intervene on the LAD.  There was a  99% ostial LAD stenosis with only TIMI I flow in the vessel.  Five  thousand units of heparin was given.  Integrilin was used for adjunctive  therapy.  A double bolus and drip of Integrilin were started in the  catheterization lab.  An XB 3.5 cm guide catheter was inserted. A Cougar  guidewire was passed beyond the area of critical stenosis.  A 2.5 x 15  mm balloon was passed into the area of critical stenosis and was used to  dilate the vessel.  Following balloon dilatation, TIMI III flow was  restored.  Multiple projections were taken in order to try to lay out  the vessel and determine where the stent could be placed as the lesion  originated right at the left mainstem-LAD junction.  I elected the use a  3.0 x 18 mm Vision stent which was ultimately deployed at 14  atmospheres.  The stent was well expanded.  There was a good  angiographic result with TIMI III flow in the LAD and no compromise of  flow into the circumflex circulation.  The stent was then postdilated  with a 3.25 x 15 mm DuraStar balloon.  Two inflations were taken to 18  atmospheres to cover the entire stented segment.  The patient tolerated  the procedure well and had no hemodynamic compromise.  At the completion  of the procedure, there was TIMI III flow, and the vessel was a large  wraparound LAD.  At that point, the guide and wire were removed, and a  pigtail catheter was used to perform left ventriculography.  A pullback  across the aortic valve was done.  The patient was transferred back to  the CCU.   FINDINGS:  Aortic pressure 135/59 with a mean of 97, left ventricular  pressure 136/34.   Left mainstem has minor nonobstructive plaque.  It bifurcates into the  LAD and left circumflex.   The LAD is a large-caliber vessel that courses  down and wraps around  the LV apex.  The LAD has an ulcerated-appearing plaque with thrombus in  the ostium of the vessel. There is a 99% stenosis.  The LAD flow  initially was TIMI II and then, after two images, the flow worsened into  TIMI I  flow.  There is a large first diagonal branch that has minor  stenosis.   The left circumflex supplies a moderate-size intermediate branch with no  significant stenosis.  The circumflex courses down and has minor  nonobstructive plaque in the range of 20% in the mid circumflex.  There  is a small first OM branch and a larger second OM branch.  The second OM  is free of any significant disease.   The right coronary artery has no significant angiographic stenosis.  The  right coronary artery is dominant, supplies a PDA branch.  The right  coronary collateralizes the LAD through its septal perforators with  faint filling of the LAD.   Left ventriculography demonstrates marked LV systolic dysfunction with a  large area of anteroapical as well as inferoapical akinesis.  The LVEF  is estimated at 20%.   ASSESSMENT:  1. Severe ostial left anterior descending stenosis with TIMI I flow.  2. Nonobstructive left circumflex and right coronary artery stenosis.  3. Severe left ventricular dysfunction consistent with large anterior      wall myocardial infarction.  4. Successful percutaneous intervention of the left anterior      descending using a bare metal stent.   PLAN:  [name] will continue with 18 hours of Integrilin.  Will load  him was 600 mg of Plavix and continue on aspirin.  Will continue support  along with the critical care team.  The major immediate issue is in  regard to his neurologic recovery.  Hopefully his left ventricular  function will recover with reperfusion.      Veverly Fells. Excell Seltzer, MD  Electronically Signed     MDC/MEDQ  D:  09/12/2007  T:  09/12/2007  Job:  [id number]   cc:   Nelda Bucks, MD

## 2010-12-16 NOTE — Consult Note (Signed)
NAMECRUZE, ZINGARO              ACCOUNT NO.:  [id number]   MEDICAL RECORD NO.:  [id number]          PATIENT TYPE:  INP   LOCATION:  2906                         FACILITY:  [facility]   PHYSICIAN:  Veverly Fells. Excell Seltzer, MD  DATE OF BIRTH:  09/11/1945   DATE OF CONSULTATION:  DATE OF DISCHARGE:                                 CONSULTATION   PRIMARY CARDIOLOGIST:  Veverly Fells. Excell Seltzer, M.D. (new).   PRIMARY CARE PHYSICIAN:  Not listed.   HISTORY OF PRESENT ILLNESS:  This is a 65 year old Caucasian male with  no prior history of CAD, hypertension, hypercholesterolemia, who had an  out-of-hospital cardiac arrest.  The wife awoke early a.m. around 5 a.m.  when the patient fell and slumped onto the floor unconscious.  She  called 911.  On arrival the patient was found to be in VFib.  AED was  placed and the patient received 1 defibrillation.  The patient was  shocked into sinus bradycardia.  He was given atropine 1 mg and brought  to the ER.  On arrival at the ER, the patient was intubated, transvenous pacemaker  was placed.  The patient was found to be in AFib with RVR.  The patient  was given Cardizem and brought to the ICU.  The patient's Cardizem has  been discontinued and he is now on Levophed drip.  He has been seen by  Dr. Tyson Alias who has continued critical care medicine evaluation and  has placed a central line.  We are asked to evaluate concerning this  arrest and abnormal EKG which did show some ST elevation anteriorly  initially but now things have settled down and it does appear to have  some Q waves anterolaterally with T wave inversion  in the lateral  leads.   PAST MEDICAL HISTORY:  Negative.  The patient does not see a physician  regularly.   PAST SURGICAL HISTORY:  Negative.   REVIEW OF SYSTEMS:  Unobtainable secondary to the patient's sedation and  level of consciousness.  However, his wife states he does have chest  pain with mowing the lawn.   SOCIAL HISTORY:  He  lives in [place] with his wife.  He is a 40 pack-  year smoker.  Negligible ETOH.   FAMILY HISTORY:  Mother died of an MI at age 52.   CURRENT MEDICATIONS:  At home, none.   CURRENT ALLERGIES:  None.   PHYSICAL EXAMINATION:  VITAL SIGNS:  Blood pressure 130/58, heart rate  75, respirations 20.  GENERAL:  He is intubated and sedated.  HEENT:  Head is normocephalic atraumatic.  Eyes:  PERRLA.  Mucous  membranes of the mouth are pink and moist.  Tongue is midline.  NECK:  Supple.  There is no JVD or carotid bruits at present.  CARDIOVASCULAR:  Regular rate and rhythm without murmurs, rubs, or  gallops present.  LUNGS:  Essentially clear to auscultation.  ABDOMEN:  Soft, nontender.  Two plus bowel sounds.  EXTREMITIES:  Cool.  NEUROLOGIC:  Suboptimal secondary to sedation.   CURRENT LABORATORY:  Sodium 138, potassium 3.9, chloride 106, CO2 33,  BUN  12, creatinine 1.1, glucose 223.  Hemoglobin 12.6, hematocrit 38.6,  white blood cells 12.5, platelets 277.  CK 90.2, MB 1.7, troponin less  than 0.05.   IMPRESSION:  Out-of-hospital cardiac arrest with initial  electrocardiograms showing ST elevation myocardial infarction but now  electrocardiogram has settled down.   Review of EKG with Dr. Myrtis Ser, Dr. Eden Emms, and Dr. Excell Seltzer:  No need to  send directly to the cath lab at present as an emergent ST elevated MI  but on further evaluation per Dr. Excell Seltzer, the decision to proceed with  cath to answer question of CAD in the setting of questionable anterior  MI.  This has been discussed with the family.  Risks and benefits have  been discussed with the family per Dr. Excell Seltzer who are willing to proceed  with cardiac cath.      Bettey Mare. Lyman Bishop, NP      Veverly Fells. Excell Seltzer, MD  Electronically Signed    KML/MEDQ  D:  09/12/2007  T:  09/12/2007  Job:  [id number]

## 2010-12-16 NOTE — Assessment & Plan Note (Signed)
[facility]                            CARDIOLOGY OFFICE NOTE   NAME:Sacca, NARESH ALTHAUS                     MRN:          [id number]  DATE:10/19/2007                            DOB:          1946/07/25    HISTORY:  Dawsen Krieger was seen in followup at the [facility] Cardiology  Office on October 19, 2007.  Mr. Steig is a very nice 65 year old  gentleman who had a prolonged hospitalization at [facility] from  September 12, 2007 through September 30, 2007.  He presented with an out of  hospital cardiac arrest secondary to ventricular fibrillation.  He was  defibrillated and had CPR performed by the paramedics.  He was cooled  for neurologic preservation and then underwent emergent PCI of the LAD  as the etiology of his V-fib was an acute anterior wall infarction.  From a cardiac standpoint, he actually recovered quite well and was able  to be extubated after just a few days.  His hospitalization was  prolonged secondary to aspiration and swallowing dysfunction.  He  ultimately underwent PEG tube placement.  He also had some problems with  hypotension during his hospital course that was related to dehydration  and low p.o. intake over several days.  Mr. Paulson was discharged to  [facility] as a transition to returning home.  He tells me that he is  doing quite well.  He is walking regularly without symptoms.  He has had  no further lightheadedness or near syncope.  He denies chest pain,  dyspnea, orthopnea, PND or edema.  He is tolerating his medical therapy  well.  He has been eating regularly and is currently at 3 meals per day.  He has not been using his PEG tube for anything except taking pills.  He  really wants to return home and is awaiting a swallowing test on November 03, 2007.   CURRENT MEDICATIONS:  1. Aspirin 325 mg daily.  2. Plavix 75 mg daily.  3. Zocor 40 mg at bedtime.  4. Digoxin 0.125 mg daily.  5. Coreg 3.125 mg twice daily.   ALLERGIES:  NKDA.   PHYSICAL EXAMINATION:  GENERAL:  The patient is alert and oriented.  He  is in no acute distress.  VITAL SIGNS:  Weight 131, blood pressure 100/50, heart rate 80,  respiratory rate 16.  HEENT:  Normal.  NECK:  Normal carotid upstrokes without bruits.  Jugular venous pressure  is normal.  LUNGS:  Clear to auscultation bilaterally.  HEART:  Regular rate and rhythm without murmurs or gallops.  ABDOMEN:  Soft, nontender, no organomegaly, no bruits.  EXTREMITIES:  No clubbing, cyanosis or edema.  Peripheral pulses are  intact and equal.   DIAGNOSTICS:  EKG shows sinus rhythm with age indeterminate anteroseptal  infarct.  There is a nonspecific ST/T wave abnormality present.   ASSESSMENT:  1. Acute anterior wall myocardial infarction complicated by      ventricular fibrillation arrest.  Mr. Marzette has had an excellent      recovery.  I would like him to increase his Coreg dose to 6.25 mg  twice daily.  Otherwise, I think he should remain on his current      medical therapy without changes.  His blood pressure is borderline.      He has had symptomatic hypotension during his hospitalization.  For      those reasons, I am going to wait on starting an ACE inhibitor.      When I see him back in followup in 4 weeks, I will likely try a low-      dose ACE inhibitor depending on how his blood pressure and symptoms      have been.  I have encouraged him to continue with regular walking.      He does not have the means for outpatient cardiac rehab, so he will      continue to walk on his own.  I would like to see him back in 4      weeks with an echocardiogram to reassess his LV function.  His      initial LVEF postinfarction was 20%.  2. Dyslipidemia.  He remains on simvastatin.  We will follow up lipids      and LFTs at the time of his return visit.  3. Status post bare-metal stenting of the proximal left anterior      descending.  As detailed above, this was performed  in the setting      of his anterior wall MI.  He should remain on dual antiplatelet      therapy at this time with aspirin and Plavix.  4. Swallowing dysfunction.  He is due to be reassessed on November 03, 2007 with a barium swallow.  This is the earliest that this study      could be done.  He and his wife are anxious to go home from      [place].  I have a call in to Dr. Leanord Hawking to see if his return      home is possible or if there is something else that is keeping him      in that facility.   As detailed above, I would like to see Mr. Shawn in 4 weeks with an  echocardiogram as well as a metabolic panel and lipid panel.  I am very  pleased with his initial progress as he seems to be on the road to  recovery.     Veverly Fells. Excell Seltzer, MD  Electronically Signed    MDC/MedQ  DD: 10/19/2007  DT: 10/19/2007  Job #: [id number]   cc:   Bernette Redbird, M.D.

## 2010-12-16 NOTE — Assessment & Plan Note (Signed)
[name]                            CARDIOLOGY OFFICE NOTE   NAME:[name], [name]                     MRN:          [id number]  DATE:07/11/2008                            DOB:          1945-10-05    REASON FOR VISIT:  Post hospitals followup after ICD.   HISTORY OF PRESENT ILLNESS:  [name] is a 65 year old gentleman with  coronary artery disease status post anterior wall MI without of hospital  cardiac arrest in February 2009.  He has severe residual ischemic  cardiomyopathy with an LVEF of 20-25%.  He has done remarkably well from  a symptomatic standpoint.  He recently underwent ICD placement for  prevention for primary prevention of sudden cardiac death.  He is doing  well since his procedure.  His left arm and shoulder pain has nearly  resolved.  He has no complaints today.   He denies exertional chest pain or dyspnea.  He has had no edema or  other cardiac problems.   MEDICATIONS:  1. Plavix 75 mg daily.  2. Digoxin 0.125 mg daily.  3. Coreg 12.5 mg twice daily.  4. Aspirin 81 mg daily.  5. Lisinopril 5 mg daily.  6. Simvastatin 40 mg daily.   ALLERGIES:  NKDA.   PHYSICAL EXAMINATION:  GENERAL:  The patient is alert and oriented in no  acute distress.  VITAL SIGNS:  Weight is 165 pounds, blood pressure 137/68, heart rate  77, and respiratory rate 12.  HEENT:  Normal.  NECK:  Normal carotid upstrokes.  No bruits.  JVP normal.  LUNGS:  Clear bilaterally.  CHEST:  The ICD site is healing well.  Steri-Strips are still in place.  There is no evidence of ecchymosis or hematoma.  ABDOMEN:  Soft and nontender.  HEART:  Regular rate and rhythm.  No murmurs or gallops.  EXTREMITIES:  No clubbing, cyanosis, or edema.   EKG shows normal sinus rhythm with age-indeterminate anteroseptal  infarct and diffuse ST-T wave abnormality consistent with anterolateral  ischemia.   ASSESSMENT:  1. Coronary artery disease status post myocardial  infarction.      Continue current medical therapy without changes.  Note, the      patient remains asymptomatic with no angina.  His Myoview scan      demonstrated infarct with no ischemia.  2. Ischemic cardiomyopathy.  He has a New York Heart Association class      I symptoms at present.  Status post implantable cardioverter-      defibrillator.  3. Dyslipidemia.  His LDL was 91, but HDL was only 27.  Niaspan was      added to his simvastatin today.  He will start on 500 mg and if      tolerating, we will increase to 1 g after 2 weeks.  We will follow      up lipids and LFTs in 8 weeks.  He was counseled regarding flushing      and premedication with aspirin.   For followup, I would like to see [name] back in 2 months.     Veverly Fells.  Excell Seltzer, MD  Electronically Signed    MDC/MedQ  DD: 07/11/2008  DT: 07/12/2008  Job #: [id number]   cc:   Doylene Canning. Ladona Ridgel, MD

## 2010-12-16 NOTE — Consult Note (Signed)
NAMEJAVIEL, [name] NO.:  [id number]   MEDICAL RECORD NO.:  [id number]          PATIENT TYPE:  INP   LOCATION:  3731                         FACILITY:  [facility]   PHYSICIAN:  Graylin Shiver, M.D.   DATE OF BIRTH:  1946-05-26   DATE OF CONSULTATION:  09/21/2007  DATE OF DISCHARGE:                                 CONSULTATION   We were asked to see [name] today in consultation for dysphagia by  Dr. Shan Levans.   HISTORY OF PRESENT ILLNESS:  This is a 65 year old male who was admitted  after a cardiac arrest.  He had subsequent treatment with hyperthermic  protocol.  He has been seen by neurology who has discussed the  possibility of probable anoxic encephalopathy.  He does have a history  of esophageal stricture with dilation two times in 2004.  The patient  reports no problems swallowing since his dilatations.  He describes no  reflux symptoms.  No anorexia.  As a matter of fact, he was gaining  weight, and describes no dysphagia or abdominal aphasia since 2004 prior  to his cardiac arrest.  The patient now has severe aspiration on  modified barium swallow as done by speech therapy today and we were  called to evaluate or rule out any esophageal etiologies of his current  dysphagia.   PAST MEDICAL HISTORY:  Significant for  1. 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 Bernette Redbird.  2. The patient also had a colonoscopy in November 2004 and was found      to have hyperplastic rectal polyp.   CURRENT MEDICATIONS:  None.   CURRENT ALLERGIES:  None.   FAMILY HISTORY:  Significant for colon cancer in both of his sisters and  colon polyps in one brother.   REVIEW OF SYSTEMS:  Prior to his cardiac arrest, was negative.   PHYSICAL EXAMINATION:  GENERAL APPEARANCE:  He is alert and oriented in  no apparent distress.  His speech is clear and shows no signs of  slurring.  VITAL  SIGNS:  His temperature is currently 97.4, pulse is 69,  respirations 20, blood pressure is 102/55.  CARDIOVASCULAR:  Regular rate and rhythm with no murmurs, rubs or  gallops appreciated.  LUNGS:  Clear to auscultation bilaterally.  ABDOMEN:  Thin, soft, nontender, nondistended with good bowel sounds.   BMET is within normal limits.  CBC shows a hemoglobin of 9.5, hematocrit  28.6, white count 6.5, platelets 261,000.  Coags on September 15, 2007,  his PT was 16.6, INR is 1.3.  His BNP is 1361.   Modified barium swallow done today showed severe oropharyngeal dysphagia  with severe aspiration even to applesauce.  After speaking with the  speech therapist, [name], she tells me that she did scan down to see if  there could possibly be a stricture, but the patient was unable to  swallow enough contrast to be able to tell if there was a stricture  there or not.  He  had a great deal of residuals in his oropharyngeal  area.   ASSESSMENT:  This is a 65 year old male with essentially new-onset  dysphasia after his cardiac arrest. Dr. 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  At present of greatest concern is how to feed this  gentleman.  He refused a Panda tube today and is unable to take  nutrition orally.  Further, if any procedures such an endoscopy for  percutaneous endoscopic gastrostomy tube placement is to be done, the  patient will need to come off of his current anticoagulation.  He is  currently receiving Plavix, Lovenox and aspirin.  At this time, we will  reevaluate begin tomorrow and discuss the patient's situation with the  primary team.   Thanks very much for this consultation.       Stephani Police, PA    ______________________________  Graylin Shiver, M.D.    MLY/MEDQ  D:  09/21/2007  T:  09/22/2007  Job:  [id number]   cc:   Bernette Redbird, M.D.

## 2010-12-16 NOTE — Consult Note (Signed)
NAMEEGE, MUCKEY NO.:  [id number]   MEDICAL RECORD NO.:  [id number]          PATIENT TYPE:  INP   LOCATION:  2906                         FACILITY:  [facility]   PHYSICIAN:  Marlan Palau, M.D.  DATE OF BIRTH:  June 18, 1946   DATE OF CONSULTATION:  09/14/2007  DATE OF DISCHARGE:                                 CONSULTATION   HISTORY OF PRESENT ILLNESS:  [name] is a 65 year old left-handed  white male born 1946/04/07 with a history of esophageal stricture in  the past requiring dilation.  The patient really has not followed up  with physicians in the past.  Has reported episodes of chest pain that  he has related to the esophagus problem.  The patient was coming back to  bed around 5 a.m. on the day of admission when he suddenly fell over.  This awakened his wife who immediately called 911.  The patient was  brought to the hospital, resuscitated, and found to have significant  left main disease.  The patient has been treated with hypothermia at 33  degrees centigrade for two days and then brought back to normal  temperature.  The patient is now being sedated some with fentanyl.  The  patient remains intubated.  The patient at this point is waking up,  tracking individuals as they walk across the room, but really is not  following commands.  Neurology is asked to see this patient for further  evaluation.   PAST MEDICAL HISTORY:  Significant for:   1. History of cardiac arrest as above.  2. Esophageal stricture status post dilation.  3. History of pneumoniae in the past.   ALLERGIES:  Patient has no known allergies.   HABITS:  Was smoking a pack of cigarettes a day prior to admission,  drinking 2-4 beers a day.   CURRENT MEDICATIONS:  1. Unasyn 3 g q.6 h.  2. Aspirin 325 mg daily.  3. Peridex 15 mL b.i.d.  4. Plavix 75 mg daily.  5. Lovenox 40 mg subcu q.24 h.  6. Protonix 40 mg IV daily.  7. Zocor 40 mg daily.  8. The patient gets Fentanyl  if needed.   SOCIAL HISTORY:  This patient lives in the [place] area living with  his second wife.  Has two children from a prior marriage, one child from  current marriage.  One son has renal calculi; otherwise, they are alive  and well.  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  Was  working prior to the cardiac arrest.   FAMILY MEDICAL HISTORY:  Notable that mother died with colon cancer.  Father died with MI.  The patient has one brother, five sisters.  One  sister died with colon cancer.   PHYSICAL EXAMINATION:  VITAL SIGNS:  Blood pressure is 101/53, heart  rate 106.  The patient is intubated.  Respiratory rate 21, afebrile.  GENERAL:  The patient is a fairly well-developed white male who is  sleepy, but will readily alert at the time of examination.  HEENT:  Head is atraumatic.  Eyes:  Pupils are round,  reactive to light.  Disks are flat bilaterally.  The patient blinks to threat bilaterally.  NECK:  Supple.  No carotid bruits noted.  RESPIRATORY:  Relatively clear.  CARDIOVASCULAR:  Reveals distant heart sounds.  No obvious murmurs, rubs  noted.  EXTREMITIES:  Without significant edema.  NEUROLOGIC:  Cranial nerves as above.  Facial symmetry is present.  The  patient will smile intermittently and has good facial symmetry with  this, full extraocular movements again, blinks to threat bilaterally.  Is intubated.  The patient will not follow verbal commands; however,  does move arms spontaneously with arms elevated.  Patient drifts back to  the bed in a symmetric fashion; a similar thing is seen with the with  legs.  The patient has depressed but symmetric reflexes.  Toes are  neutral bilaterally.  The patient has minimal response to deep pain  stimulation, however, on all fours.  The patient will not follow  commands for cerebellar testing.  The patient cannot be ambulated.   LABORATORY VALUES:  Notable for a hemoglobin 10, hematocrit 29.8, white  count 10.6,  platelets 226.  Sodium 136, potassium 3.9, chloride 109,  bicarb 19, glucose 90, BUN 5, creatinine 0.93.  Calcium 7.5.  Total  protein 5.5.  Albumin 2.4.  AST 33, ALT 20, total bilirubin 0.8, direct  bilirubin 0.1.  Troponin I 0.83.  Total cholesterol 121, HDL 50, LDL 58,  triglycerides  66.   CT of the head is pending.   IMPRESSION:  1. History of cardiac arrest.  2. 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   This patient clearly is beginning to respond.  The patient is being  sedated at this point which may affect the examination.  The patient is  bright, alert.  Will not follow verbal commands, but has no asymmetry  with motor testing.  Memory function is the most sensitive to the loss  of oxygen or glucose, and this cannot be assessed at this time.  The  fact that the patient is beginning to respond at this point is hopeful  that he will make a good recovery, however.   PLAN:  1. CT of the head is pending.  2. EEG study.  3. Will follow patient's clinical course.  The patient will need to be      reassessed following extubation.      Marlan Palau, M.D.  Electronically Signed     CKW/MEDQ  D:  09/14/2007  T:  09/15/2007  Job:  [id number]   cc:   Charlcie Cradle. Delford Field, MD, FCCP

## 2010-12-16 NOTE — Assessment & Plan Note (Signed)
[facility]                            CARDIOLOGY OFFICE NOTE   NAME:[name]                     MRN:          [id number]  DATE:01/06/2008                            DOB:          August 02, 1946    [name] was seen in followup at the [facility] Office on  January 06, 2008.  He is a 65 year old gentleman who is status post anterior  MI without a hospital cardiac arrest in February 2009.  He underwent the  cooling protocol as well as angioplasty and stenting of his proximal  LAD.  He has had an excellent recovery and is now back home with his  wife.  He had a short stay in a rehab facility.  He required a PEG tube  for feeding, but has recovered his normal swallowing function and has  gained 8 pounds in the last month.  He has successfully discontinued  cigarettes.   Symptomatically, he is greatly improved.  He has no chest pain, dyspnea,  lightheadedness, near-syncope, or weakness.  He denies palpitations or  edema.   CURRENT MEDICATIONS:  1. Aspirin 325 mg daily.  2. Plavix 75 mg daily.  3. Zocor 40 mg at bedtime.  4. Digoxin 0.125 mg daily.  5. Coreg 6.25 mg twice daily.  6. Lisinopril 2.5 mg daily.   ALLERGIES:  NKDA.   PHYSICAL EXAMINATION:  The patient is alert and oriented.  He is in no  acute distress.  Weight is 152 pounds, blood pressure 144/70, heart rate  72, and respiratory rate 16.  HEENT:  Normal.  NECK:  Normal carotid upstrokes without bruits.  Jugular venous pressure  is normal.  LUNGS:  Clear bilaterally.  HEART:  Regular rate and rhythm without murmurs or gallops.  ABDOMEN:  Soft, nontender.  No organomegaly.  EXTREMITIES:  No clubbing, cyanosis, or edema.  Peripheral pulses 2+ and  equal throughout.   Echocardiogram from November 17, 2007, showed an LVEF of 20% to 25% with a  mildly dilated left ventricle.   ASSESSMENT:  1. Coronary artery disease, status post anterior myocardial infarction      with  severe ischemic cardiomyopathy.  [name] continues on the      road to recovery.  His blood pressure is elevated today.      Previously, I have had difficult time treating his cardiomyopathy      due to low blood pressure.  I would like to double his Coreg from      6.25 to 12.5 mg twice daily and increase his lisinopril from 2.5 mg      to 5 mg daily.  Otherwise, no changes were made to his medical      regimen today.  He should continue on dual antiplatelet therapy in      the setting of his recent myocardial infarction.  2. Severe ischemic cardiomyopathy.  He has been seen by Dr. Ladona Ridgel and      he is planning on undergoing implantation of a prophylactic      implantable cardioverter-defibrillator in the setting of his      severely  depressed left ventricular ejection fraction and ischemic      heart disease.   For followup, I would like to see [name] back in 3 months.  I have  asked him to decrease his aspirin dose to 81 mg.  He is going to  consider enrollment in the TRA2P study, but would like to review this  further with his wife.     Veverly Fells. Excell Seltzer, MD  Electronically Signed    MDC/MedQ  DD: 01/16/2008  DT: 01/17/2008  Job #: [id number]

## 2010-12-16 NOTE — Procedures (Signed)
EEG NUMBER:  [id number].   CURRENT HISTORY:  A 65 year old man status post cardiac arrest.  EEG is  performed for evaluation.  The patient is described as awake and drowsy.  This is a portable EEG done at the bedside.   DESCRIPTION:  The dominant rhythm of this tracing is a diffuse theta-  alpha rhythm of 7-8 Hz which predominates posteriorly, appears without  abnormal asymmetry, and attenuates with eye opening and closing.  Fairly  abundant low-amplitude fast activity is seen diffusely.  Intermittently  throughout the record, some more diffuse higher-amplitude slowing of 4-5  Hz is seen, and during a couple of occasions on the record there appears  to be the presence of centrally-generated sleep spindles.  No focal  slowing is noted and no epileptiform discharges are seen.  Photic  stimulation and hyperventilation are not performed.  A single channel  devoted to EKG reveals sinus rhythm throughout at a rate of  approximately 96 beats per minute.   CONCLUSIONS:  Abnormal study due to the presence of mild diffuse slowing  of the background rhythms, findings suggestive of diffuse widespread  cerebral dysfunction.  No focal slowing is noted and no epileptiform  discharges are seen.      Michael L. Thad Ranger, M.D.  Electronically Signed     UJW:[id number]  D:  09/15/2007 47:82:95  T:  09/17/2007 12:34:14  Job #:  [id number]

## 2010-12-16 NOTE — Op Note (Signed)
[name], [name]              ACCOUNT NO.:  [id number]   MEDICAL RECORD NO.:  [id number]          PATIENT TYPE:  INP   LOCATION:  3731                         FACILITY:  [facility]   PHYSICIAN:  Doylene Canning. Ladona Ridgel, MD    DATE OF BIRTH:  Apr 22, 1946   DATE OF PROCEDURE:  07/06/2008  DATE OF DISCHARGE:                               OPERATIVE REPORT   [name] is here today for prophylactic defibrillator insertion.   INTRODUCTION:  The patient is a 65 year old man with a history of  ischemic cardiomyopathy, status post anterior myocardial infarction in  February 2009.  He has severe LV dysfunction with an EF of 20-25%, class  II heart failure, narrow QRS, and is now referred for single-chamber  defibrillator insertion.   PROCEDURE:  After informed consent was obtained, the patient was taken  to the diagnostic EP lab in a fasting state.  After usual preparation  and draping, intravenous fentanyl and midazolam was given for sedation.  Lidocaine 30 mL was infiltrated into the left infraclavicular region.  A  9-French bipolar defibrillator lead (Medtronic model [id number]) was advanced  into the right ventricle, and mapping was carried out.  At the final  site, the R-waves measured 20 mV and the pace impedance was 800 ohms,  and threshold was less than 1 V of 0.5 msec.  A 10-V pacing did not  stimulate the diaphragm with lead actively fixed.  With the  defibrillator lead in satisfactory position, it was secured to  subpectoralis fascia with a figure-of-eight silk suture.  The sewing  sleeve was also secured with silk suture.  Electrocautery was then  utilized to make a subcutaneous pocket.  Kanamycin irrigation was  utilized to irrigate the pocket.  Electrocautery was utilized to assure  hemostasis.  The Medtronic Maximo single-chamber defibrillator, serial  number [id number] was connected to the defibrillation lead.  The  defibrillation lead serial number [id number] and placed back in the  subcutaneous pocket.  The generator was secured with silk suture.  Defibrillation threshold was then carried out.   After the patient was more deeply sedated with fentanyl and Versed, VF  was induced with a T-wave shock.  A 15-J shock was delivered terminating  VF and restoring the sinus rhythm.  At this point, no additional  defibrillation threshold testing was carried out, and the incision was  closed with a layer of 2-0 Vicryl, followed by layer of 3-0 Vicryl.  Benzoin was painted on the skin.  Steri-Strips were applied, pressure  dressing was placed, and the patient was returned to his room in  satisfactory condition.   COMPLICATIONS:  There were no immediate procedure complications.   RESULTS:  Demonstrate successful implantation of a Medtronic single-  chamber defibrillator in a patient with an ischemic cardiomyopathy and  congestive heart failure, status post anterior myocardial infarction, EF  of 25%.      Doylene Canning. Ladona Ridgel, MD  Electronically Signed     GWT/MEDQ  D:  07/06/2008  T:  07/06/2008  Job:  [id number]

## 2010-12-19 NOTE — Op Note (Signed)
NAME:  [name], [name]                        ACCOUNT NO.:  [id number]   MEDICAL RECORD NO.:  [id number]                   PATIENT TYPE:  AMB   LOCATION:  ENDO                                 FACILITY:  [facility]   PHYSICIAN:  Bernette Redbird, M.D.                DATE OF BIRTH:  1946/06/01   DATE OF PROCEDURE:  06/25/2003  DATE OF DISCHARGE:                                 OPERATIVE REPORT   PROCEDURE:  Colonoscopy with polypectomy and biopsies.   INDICATION:  A 65 year old with strong family history of colon cancer (in  two sisters) plus colon polyps in his brother.   FINDINGS:  Several small to medium size polyps removed from the distal  colon.   DESCRIPTION OF PROCEDURE:  The nature, purpose, and risks of the procedure  had been discussed with the patient who provided written consent.  Sedation  for this procedure plus the upper endoscopy which preceded it totaled  fentanyl 100 mcg and Versed 10 mg IV without clinical instability.  Digital  exam of the prostate was normal.  The Olympus colonoscope was advanced  around the colon to the cecum, this identified by clear visualization of the  appendiceal orifice, and pullback was then performed.  The quality of the  prep was excellent, and it was felt that all areas were well seen.   In the rectosigmoid region from 15 cm down to about 5 cm, I encountered a  total of approximately seven polyps.  One of two of these were removed by  cold biopsy technique because they were sessile and very small in character.  The others were removed by snare technique with complete hemostasis and no  evidence of excessive cautery, the largest being probably 1.2 cm across, and  a couple of others being more on the order of 4-6 mm, then perhaps another 8-  10 mm polyp.   There was no evidence of cancer, colitis, vascular malformations, or  diverticulosis.  Retroflexion in the rectum and reinspection of the  rectosigmoid disclosed no additional lesions.   The patient tolerated the  procedure well, and there were no apparent complications.   IMPRESSION:  1. Rectal polyps removed as described above.  2. Strong family history of colorectal neoplasia.   PLAN:  1. Await pathology.  2. Anticipate colonoscopic follow-up in 3-5 years depending on the     histologic findings.                                               Bernette Redbird, M.D.    RB/MEDQ  D:  06/25/2003  T:  06/25/2003  Job:  [id number]   cc:   Tracey Harries, M.D.  [address]  [place]  [place] [address]  Fax: [phone]

## 2010-12-19 NOTE — Op Note (Signed)
NAME:  [name], [name]                        ACCOUNT NO.:  [id number]   MEDICAL RECORD NO.:  [id number]                   PATIENT TYPE:  AMB   LOCATION:  ENDO                                 FACILITY:  [facility]   PHYSICIAN:  Bernette Redbird, M.D.                DATE OF BIRTH:  [birth year]   DATE OF PROCEDURE:  06/25/2003  DATE OF DISCHARGE:                                 OPERATIVE REPORT   PROCEDURE:  Upper endoscopy and Savary dilatation of the esophagus under  fluoroscopy.   INDICATIONS:  This 65 year old gentleman with significant dysphagia  symptoms, now two months past post dilatation of a tight esophageal ring to  14 mm.  At the time of his previous endoscopy, the endoscope could not be  passed through that area.  His dysphagia symptoms are much improved at the  present time, but it was felt that dilatation to a larger diameter would  permit longer term relief from dysphagia symptoms.   FINDINGS:  Ring less tight than before.  Dilatation performed to 16 mm.   DESCRIPTION OF PROCEDURE:  The nature, purpose and risks of this procedure  had been previously been discussed with the patient and were familiar to him  from prior examinations and he provided written consent.  Sedation -  fentanyl 60 mcg and Versed 7 mg IV without arrhythmias(albeit there was some  sinus tachycardia)  or desaturation.   The Olympus Q-160 video endoscope was passed under direct vision.  The vocal  cords were not well seen.  The esophagus was readily entered and was normal  in its entirety without evidence of reflux esophagitis, Barrett's esophagus,  varices, infection, or neoplasia, but there was moderately tight ring right  at the squamocolumnar junction.  Unlike before, I was able to pop the scope  through the ring although there was some slight to moderate resistance when  doing so.  The stomach contained no significant residual.  No gastritis,  erosions, ulcers, polyps, or masses were observed  including retroflexed view  of the proximal stomach.  A small hiatal hernia may have been present but it  was nothing traumatic.  The pylorus, duodenal bulb and second duodenum were  briefly examined and appeared normal.   Savary dilatation was then performed in the standard fashion.  The spring-  tip guide wire was passed into the antrum of the stomach through the scope.  The scope was removed in an exchange fashion, appropriate positioning was  confirmed fluoroscopically and sequential dilatation was then performed  using Savary dilators, sizes 14 and 16 mm, under fluoroscopic guidance,  encountering fairly smooth resistance without any discrete pop or click.  Repeat endoscopy under direct vision showed fresh hemorrhage at the site of  the ring where there appeared to have been a fracture of the ring without  undue trauma to the esophagus, stomach or hypopharynx.  The patient  tolerated the procedure well and there  were no apparent complications.   IMPRESSION:  Esophageal ring dilated to 16 mm as described above.   PLAN:  Clinical followup of dysphagia symptoms.                                               Bernette Redbird, M.D.    RB/MEDQ  D:  06/25/2003  T:  06/25/2003  Job:  [id number]   cc:   Tracey Harries, M.D.  [address]  [place]  [place] [address]  Fax: [phone]

## 2010-12-19 NOTE — Op Note (Signed)
NAME:  [name], [name]                  ACCOUNT NO.:  [id number]   MEDICAL RECORD NO.:  [id number]                   PATIENT TYPE:  AMB   LOCATION:  ENDO                                 FACILITY:  [facility]   PHYSICIAN:  Bernette Redbird, M.D.                DATE OF BIRTH:  March 24, 1946   DATE OF PROCEDURE:  04/26/2003  DATE OF DISCHARGE:                                 OPERATIVE REPORT   PROCEDURE:  Upper endoscopy with Savary dilatation of the esophagus.   INDICATION:  History of dysphagia symptoms and weight loss in a 65 year old.  The symptoms have improved substantially on PPI therapy.   FINDINGS:  Tight esophageal ring, dilated to 14 mm.   DESCRIPTION OF PROCEDURE:  The nature, purpose, and risks of the procedure  had been discussed with the patient who provided written consent.  Sedation  was fentanyl 100 mcg and Versed 10 mg IV without arrhythmias or  desaturation.  The Olympus small-caliber adult video endoscope was passed  under direct vision.  The vocal cords looked normal.  The esophagus was  readily entered.   The esophageal mucosa was normal all the way down to the squamocolumnar  junction, at which location there was a tight esophageal ring through which  the 9 mm endoscope could not be passed.  There was no evidence of any  neoplasia, reflux esophagitis, Barrett's esophagus, varices, infection, or  neoplasia within the esophagus.   I could see down into the patient's stomach, and there appeared to be a  small hiatal hernia.  The Savary spring-tipped guidewire was passed through  the scope semi-blindly into the gastric lumen, and the scope was removed in  an exchange fashion and, using fluoroscopic guidance, sequential passage was  made with Savary dilators, sizes 12 and 14 mm, encountering smooth  resistance.  The guidewire was removed, and the patient was re-endoscoped  under direct vision.  The ring was nicely fractured with some fresh  hemorrhage but no  evidence of significant active bleeding and although there  was moderately deep sulcus at the site of dilatation, there did not appear  to be any perforation of the esophageal wall.   The stomach was then examined.  There was no evidence of gastritis,  erosions, ulcers, polyps, or masses, although there were some somewhat  prominent folds in the prepyloric region and the pylorus, duodenal bulb, and  second duodenum looked normal, without evidence of peptic deformity.  Retroflexed viewing was unremarkable.   The scope was removed from the patient.  He tolerated the procedure well,  and there were no apparent complications.   IMPRESSION:  1. Tight esophageal ring, dilated to 14 mm as described above.  2. Probable small hiatal hernia, roughly 3 cm in size.    PLAN:  Clinical follow-up of dysphagia symptoms.  Consider further  dilatation depending on the patient's response to today's dilatation.  Bernette Redbird, M.D.    RB/MEDQ  D:  04/26/2003  T:  04/26/2003  Job:  [id number]   cc:   Tracey Harries, M.D.  [address]  [place]  [place] [address]  Fax: [phone]

## 2011-01-01 ENCOUNTER — Encounter: Payer: Self-pay | Admitting: Cardiovascular Disease

## 2011-01-06 ENCOUNTER — Other Ambulatory Visit: Payer: Self-pay | Admitting: Cardiovascular Disease

## 2011-01-13 ENCOUNTER — Other Ambulatory Visit: Payer: Self-pay | Admitting: Cardiovascular Disease

## 2011-01-13 ENCOUNTER — Ambulatory Visit (INDEPENDENT_AMBULATORY_CARE_PROVIDER_SITE_OTHER): Payer: Self-pay | Admitting: Cardiovascular Disease

## 2011-01-13 ENCOUNTER — Encounter: Payer: Self-pay | Admitting: Cardiovascular Disease

## 2011-01-13 DIAGNOSIS — I2589 Other forms of chronic ischemic heart disease: Secondary | ICD-10-CM

## 2011-01-13 DIAGNOSIS — I2510 Atherosclerotic heart disease of native coronary artery without angina pectoris: Secondary | ICD-10-CM

## 2011-01-13 DIAGNOSIS — E785 Hyperlipidemia, unspecified: Secondary | ICD-10-CM

## 2011-01-13 DIAGNOSIS — I252 Old myocardial infarction: Secondary | ICD-10-CM

## 2011-01-13 DIAGNOSIS — I5022 Chronic systolic (congestive) heart failure: Secondary | ICD-10-CM

## 2011-01-13 NOTE — Progress Notes (Signed)
HPI:  This is a 65 year old gentleman presenting for followup evaluation. The patient has a history of coronary artery disease with anterior wall infarction in 2009 complicated by out-of-hospital ventricular fibrillation arrest. The patient was successfully resuscitated and underwent primary PCI. His LVEF did not recover and he ultimately required ICD implantation.  The patient is doing well at present. He has mild shortness of breath in the morning after he showers. He otherwise is able to do normal activities without exertional symptoms. He mows his lawn with a push mower and does other regular activities but has not engaged in vigorous exercise. He denies chest pain or pressure. He denies lightheadedness, syncope, orthopnea, PND, or edema. There have been no ICD discharges since implantation.  Outpatient Encounter Prescriptions as of 01/13/2011  Medication Sig Dispense Refill  . aspirin (ASPIR-81) 81 MG EC tablet Take 81 mg by mouth daily.        . carvedilol (COREG) 25 MG tablet Take 25 mg by mouth 2 (two) times daily.        . clopidogrel (PLAVIX) 75 MG tablet Take 75 mg by mouth daily.        . digoxin (LANOXIN) 0.125 MG tablet Take 125 mcg by mouth daily.        . furosemide (LASIX) 40 MG tablet Take 40 mg by mouth daily.        Marland Kitchen lisinopril (PRINIVIL,ZESTRIL) 20 MG tablet TAKE ONE TABLET BY MOUTH DAILY  90 tablet  5  . nitroGLYCERIN (NITROSTAT) 0.4 MG SL tablet Place 0.4 mg under the tongue as directed.        . simvastatin (ZOCOR) 40 MG tablet TAKE ONE TABLET BY MOUTH ONE TIME DAILY  90 tablet  1    Allergies  Allergen Reactions  . Niacin     Past Medical History  Diagnosis Date  . Other and unspecified hyperlipidemia   . Other specified forms of chronic ischemic heart disease     w sever residual LV dysfunc., LVEF less than 30%  . Acute myocardial infarction of other anterior wall, initial episode of care   . Coronary atherosclerosis of native coronary artery     status post  anterior MI 2008 w V. fib arrest  . Ventricular fibrillation     implantable cardiac defib ?    ROS: Negative except as per HPI  BP 118/80  Pulse 72  Resp 14  Ht 5\' 11"  (1.803 m)  Wt 168 lb (76.204 kg)  BMI 23.43 kg/m2  PHYSICAL EXAM: Pt is alert and oriented, NAD HEENT: normal Neck: JVP - normal, carotids 2+= without bruits Lungs: CTA bilaterally CV: RRR without murmur or gallop Abd: soft, NT, Positive BS, no hepatomegaly Ext: no C/C/E, distal pulses intact and equal Skin: warm/dry no rash  EKG: Normal sinus rhythm with age-indeterminate septal infarction. ST and T wave abnormality consider anterolateral ischemia.  ASSESSMENT AND PLAN:

## 2011-01-13 NOTE — Patient Instructions (Signed)
Your physician wants you to follow-up in: 6 MONTHS.  You will receive a reminder letter in the mail two months in advance. If you don't receive a letter, please call our office to schedule the follow-up appointment.  Your physician recommends that you return for a FASTING lipid profile, liver panel and BMP in 6 MONTHS --prior to your appointment ([phone])  Your physician recommends that you continue on your current medications as directed. Please refer to the Current Medication list given to you today.

## 2011-01-13 NOTE — Assessment & Plan Note (Signed)
Lipids reviewed from December 2011 with a cholesterol of 152, HDL 39, LDL 88, and triglycerides 161. LFTs are within normal limits. Recommend repeat prior to his next office visit in 6 months.

## 2011-01-13 NOTE — Assessment & Plan Note (Signed)
Stable without angina 

## 2011-01-13 NOTE — Assessment & Plan Note (Signed)
The patient's most recent echocardiogram was December 2011 and this demonstrated an LV ejection fraction in the range of 20-25%. He has no significant mitral regurgitation. He is on appropriate medication with an ACE inhibitor, carvedilol, and Lanoxin. His breathing is improved since last visit and will continue his current medications without changes.

## 2011-01-15 ENCOUNTER — Encounter: Payer: Self-pay | Admitting: *Deleted

## 2011-01-19 ENCOUNTER — Encounter: Payer: Self-pay | Admitting: *Deleted

## 2011-01-19 ENCOUNTER — Telehealth: Payer: Self-pay | Admitting: Cardiovascular Disease

## 2011-01-19 NOTE — Telephone Encounter (Signed)
I spoke with the pt's wife and the pt is not aware that she called our office.  The pt was just seen in the office on 01/13/11 and overall doing well.  They are currently out of town and she said the pt complained of some left shoulder pain.  To her knowledge this has been constant.  I made her aware that if the pt developed worsening symptoms or developed CP he should proceed to closest ER. They will be returning home this weekend and I made her aware to call our office if the pt was still having problems.  Pt's wife agreed with plan.

## 2011-01-19 NOTE — Telephone Encounter (Signed)
Per pt wife calling, pt doesn't know that his wife is calling the md office. Pt wife states he c/o pain in left sided of his shoulder. Pt is out of town all this week. Pt doesn't want to go to the nearest emergency room .

## 2011-01-26 ENCOUNTER — Ambulatory Visit (INDEPENDENT_AMBULATORY_CARE_PROVIDER_SITE_OTHER): Payer: Self-pay | Admitting: *Deleted

## 2011-01-26 ENCOUNTER — Other Ambulatory Visit: Payer: Self-pay | Admitting: Internal Medicine

## 2011-01-26 DIAGNOSIS — I428 Other cardiomyopathies: Secondary | ICD-10-CM

## 2011-01-26 DIAGNOSIS — Z95810 Presence of automatic (implantable) cardiac defibrillator: Secondary | ICD-10-CM

## 2011-01-26 DIAGNOSIS — I4901 Ventricular fibrillation: Secondary | ICD-10-CM

## 2011-01-29 ENCOUNTER — Telehealth: Payer: Self-pay | Admitting: *Deleted

## 2011-01-29 NOTE — Telephone Encounter (Signed)
PT'S WIFE AWARE PLAVIX LEFT AT FRONT DESK FOR PICK UP .[name]

## 2011-02-03 ENCOUNTER — Encounter: Payer: Self-pay | Admitting: *Deleted

## 2011-02-03 NOTE — Progress Notes (Signed)
icd remote check  

## 2011-02-24 ENCOUNTER — Other Ambulatory Visit: Payer: Self-pay | Admitting: Cardiovascular Disease

## 2011-04-24 LAB — BASIC METABOLIC PANEL
BUN: 13
BUN: 5 — ABNORMAL LOW
BUN: 6
BUN: 6
BUN: 8
BUN: 8
BUN: 9
BUN: 9
BUN: 11
BUN: 12
BUN: 18
BUN: 27 — ABNORMAL HIGH
CO2: 16 — ABNORMAL LOW
CO2: 17 — ABNORMAL LOW
CO2: 19
CO2: 19
CO2: 19
CO2: 20
CO2: 20
CO2: 21
CO2: 24
CO2: 25
CO2: 26
Calcium: 7.3 — ABNORMAL LOW
Calcium: 7.4 — ABNORMAL LOW
Calcium: 8.2 — ABNORMAL LOW
Calcium: 8.3 — ABNORMAL LOW
Calcium: 9.1
Chloride: 106
Chloride: 107
Chloride: 108
Chloride: 109
Chloride: 109
Chloride: 109
Chloride: 109
Chloride: 111
Chloride: 112
Chloride: 113 — ABNORMAL HIGH
Chloride: 114 — ABNORMAL HIGH
Chloride: 115 — ABNORMAL HIGH
Chloride: 106
Chloride: 108
Chloride: 108
Chloride: 110
Chloride: 110
Creatinine, Ser: 0.67
Creatinine, Ser: 0.77
Creatinine, Ser: 0.83
Creatinine, Ser: 0.87
Creatinine, Ser: 0.88
Creatinine, Ser: 0.89
Creatinine, Ser: 0.9
Creatinine, Ser: 0.93
Creatinine, Ser: 0.98
Creatinine, Ser: 0.87
Creatinine, Ser: 0.93
Creatinine, Ser: 0.95
GFR calc Af Amer: >60
GFR calc Af Amer: >60
GFR calc Af Amer: >60
GFR calc Af Amer: >60
GFR calc Af Amer: >60
GFR calc Af Amer: >60
GFR calc Af Amer: >60
GFR calc Af Amer: >60
GFR calc Af Amer: >60
GFR calc non Af Amer: >60
GFR calc non Af Amer: >60
GFR calc non Af Amer: >60
GFR calc non Af Amer: >60
GFR calc non Af Amer: >60
GFR calc non Af Amer: >60
GFR calc non Af Amer: >60
GFR calc non Af Amer: >60
GFR calc non Af Amer: >60
GFR calc non Af Amer: >60
Glucose, Bld: 111 — ABNORMAL HIGH
Glucose, Bld: 115 — ABNORMAL HIGH
Glucose, Bld: 116 — ABNORMAL HIGH
Glucose, Bld: 143 — ABNORMAL HIGH
Glucose, Bld: 77
Glucose, Bld: 79
Glucose, Bld: 91
Glucose, Bld: 97
Glucose, Bld: 112 — ABNORMAL HIGH
Glucose, Bld: 69 — ABNORMAL LOW
Glucose, Bld: 90
Glucose, Bld: 92
Potassium: 3.6
Potassium: 3.6
Potassium: 3.6
Potassium: 3.8
Potassium: 3.8
Potassium: 3.8
Potassium: 3.8
Potassium: 3.8
Potassium: 3.9
Potassium: 4.1
Potassium: 4.1
Potassium: 4.2
Potassium: 3.3 — ABNORMAL LOW
Potassium: 3.3 — ABNORMAL LOW
Potassium: 3.5
Potassium: 3.6
Potassium: 3.9
Sodium: 134 — ABNORMAL LOW
Sodium: 134 — ABNORMAL LOW
Sodium: 136
Sodium: 136
Sodium: 136
Sodium: 138
Sodium: 138
Sodium: 141
Sodium: 142
Sodium: 143
Sodium: 143
Sodium: 144

## 2011-04-24 LAB — CBC
HCT: 26.9 — ABNORMAL LOW
HCT: 27.3 — ABNORMAL LOW
HCT: 27.6 — ABNORMAL LOW
HCT: 35.7 — ABNORMAL LOW
HCT: 35.8 — ABNORMAL LOW
HCT: 34.3 — ABNORMAL LOW
HCT: 35.5 — ABNORMAL LOW
Hemoglobin: 11.8 — ABNORMAL LOW
Hemoglobin: 9.2 — ABNORMAL LOW
Hemoglobin: 11.3 — ABNORMAL LOW
Hemoglobin: 9.4 — ABNORMAL LOW
Hemoglobin: 9.5 — ABNORMAL LOW
MCHC: 33.2
MCHC: 33.3
MCHC: 33.6
MCV: 86.3
MCV: 86.5
MCV: 86.5
MCV: 86.6
MCV: 86.4
MCV: 86.4
Platelets: 184
Platelets: 207
Platelets: 211
Platelets: 260
Platelets: 275
Platelets: 277
Platelets: 240
Platelets: 454 — ABNORMAL HIGH
Platelets: 468 — ABNORMAL HIGH
RBC: 3.45 — ABNORMAL LOW
RBC: 4.06 — ABNORMAL LOW
RBC: 4.12 — ABNORMAL LOW
RBC: 4.41
RBC: 3.29 — ABNORMAL LOW
RDW: 14.3
RDW: 14.5
RDW: 14.6
RDW: 14.7
RDW: 14.2
RDW: 14.4
RDW: 14.5
WBC: 11.3 — ABNORMAL HIGH
WBC: 12.5 — ABNORMAL HIGH
WBC: 13.1 — ABNORMAL HIGH
WBC: 14.4 — ABNORMAL HIGH
WBC: 8.1
WBC: 8.9
WBC: 6.5
WBC: 6.7
WBC: 8.1

## 2011-04-24 LAB — CULTURE, BAL-QUANTITATIVE W GRAM STAIN

## 2011-04-24 LAB — BLOOD GAS, ARTERIAL
Acid-base deficit: 5.2 — ABNORMAL HIGH
Acid-base deficit: 8.1 — ABNORMAL HIGH
Acid-base deficit: 9.4 — ABNORMAL HIGH
Bicarbonate: 16.2 — ABNORMAL LOW
Drawn by: 22420
Drawn by: 27339
Drawn by: 273391
FIO2: 0.4
FIO2: 0.4
FIO2: 0.5
FIO2: 0.5
MECHVT: 550
O2 Saturation: 98.7
PEEP: 5
Patient temperature: 98.6
RATE: 20
RATE: 22
RATE: 22
RATE: 22
pCO2 arterial: 30.1 — ABNORMAL LOW
pCO2 arterial: 30.5 — ABNORMAL LOW
pCO2 arterial: 36.6
pH, Arterial: 7.344 — ABNORMAL LOW
pH, Arterial: 7.408
pO2, Arterial: 147 — ABNORMAL HIGH
pO2, Arterial: 155 — ABNORMAL HIGH
pO2, Arterial: 87.8
pO2, Arterial: 89.9

## 2011-04-24 LAB — CARDIAC PANEL(CRET KIN+CKTOT+MB+TROPI)
CK, MB: 3.1
CK, MB: 30.1 — ABNORMAL HIGH
CK, MB: 31.3 — ABNORMAL HIGH
Relative Index: 1.1
Relative Index: 12.8 — ABNORMAL HIGH
Relative Index: 15.5 — ABNORMAL HIGH
Relative Index: 18.9 — ABNORMAL HIGH
Relative Index: 5.5 — ABNORMAL HIGH
Total CK: 136
Total CK: 166
Total CK: 216
Total CK: 236 — ABNORMAL HIGH
Total CK: 513 — ABNORMAL HIGH
Troponin I: 0.37 — ABNORMAL HIGH
Troponin I: 0.51
Troponin I: 0.75
Troponin I: 1.05
Troponin I: 1.37

## 2011-04-24 LAB — B-NATRIURETIC PEPTIDE (CONVERTED LAB)
Pro B Natriuretic peptide (BNP): 1095 — ABNORMAL HIGH
Pro B Natriuretic peptide (BNP): 2240 — ABNORMAL HIGH
Pro B Natriuretic peptide (BNP): 2456 — ABNORMAL HIGH

## 2011-04-24 LAB — APTT
aPTT: 32
aPTT: 33

## 2011-04-24 LAB — URINE MICROSCOPIC-ADD ON

## 2011-04-24 LAB — I-STAT 8, (EC8 V) (CONVERTED LAB)
Acid-base deficit: 9 — ABNORMAL HIGH
Chloride: 106
HCT: 42
Hemoglobin: 14.3
Operator id: 272551
Potassium: 3.9
Sodium: 138
TCO2: 17
pH, Ven: 7.292

## 2011-04-24 LAB — POCT I-STAT 3, ART BLOOD GAS (G3+)
O2 Saturation: 100
TCO2: 20
pCO2 arterial: 42.4
pH, Arterial: 7.234 — ABNORMAL LOW
pO2, Arterial: 395 — ABNORMAL HIGH

## 2011-04-24 LAB — MAGNESIUM
Magnesium: 1.8
Magnesium: 2

## 2011-04-24 LAB — URINALYSIS, ROUTINE W REFLEX MICROSCOPIC
Ketones, ur: 40 — AB
Ketones, ur: NEGATIVE
Nitrite: NEGATIVE
Nitrite: NEGATIVE
Protein, ur: 100 — AB
Protein, ur: NEGATIVE
Urobilinogen, UA: 0.2

## 2011-04-24 LAB — POCT I-STAT CREATININE
Creatinine, Ser: 1.1
Operator id: 272551

## 2011-04-24 LAB — DIFFERENTIAL
Basophils Absolute: 0
Basophils Relative: 0
Eosinophils Absolute: 0
Eosinophils Relative: 0
Eosinophils Relative: 1
Lymphocytes Relative: 28
Lymphocytes Relative: 9 — ABNORMAL LOW
Lymphs Abs: 1.3
Monocytes Relative: 5
Neutrophils Relative %: 66
Neutrophils Relative %: 87 — ABNORMAL HIGH

## 2011-04-24 LAB — HEPATIC FUNCTION PANEL
Alkaline Phosphatase: 93
Indirect Bilirubin: 0.7
Total Protein: 5.5 — ABNORMAL LOW

## 2011-04-24 LAB — PROTIME-INR
INR: 1.2
Prothrombin Time: 15.3 — ABNORMAL HIGH
Prothrombin Time: 15.7 — ABNORMAL HIGH
Prothrombin Time: 16.8 — ABNORMAL HIGH
Prothrombin Time: 14.6

## 2011-04-24 LAB — LACTIC ACID, PLASMA: Lactic Acid, Venous: 1.1

## 2011-04-24 LAB — CULTURE, RESPIRATORY W GRAM STAIN

## 2011-04-24 LAB — LIPID PANEL
HDL: 50
LDL Cholesterol: 58
Triglycerides: 66
VLDL: 13

## 2011-04-24 LAB — CULTURE, BLOOD (ROUTINE X 2)
Culture: NO GROWTH
Culture: NO GROWTH
Culture: NO GROWTH
Culture: NO GROWTH

## 2011-04-24 LAB — HEMOGLOBIN A1C
Hgb A1c MFr Bld: 5.2
Mean Plasma Glucose: 108

## 2011-04-24 LAB — POCT CARDIAC MARKERS: Troponin i, poc: <0.05

## 2011-04-30 ENCOUNTER — Ambulatory Visit (INDEPENDENT_AMBULATORY_CARE_PROVIDER_SITE_OTHER): Payer: Self-pay | Admitting: *Deleted

## 2011-04-30 ENCOUNTER — Encounter: Payer: Self-pay | Admitting: Internal Medicine

## 2011-04-30 ENCOUNTER — Other Ambulatory Visit: Payer: Self-pay | Admitting: Internal Medicine

## 2011-04-30 DIAGNOSIS — I4901 Ventricular fibrillation: Secondary | ICD-10-CM

## 2011-04-30 DIAGNOSIS — Z95810 Presence of automatic (implantable) cardiac defibrillator: Secondary | ICD-10-CM

## 2011-05-06 ENCOUNTER — Encounter: Payer: Self-pay | Admitting: *Deleted

## 2011-05-07 NOTE — Progress Notes (Signed)
icd remote check  

## 2011-05-13 ENCOUNTER — Other Ambulatory Visit: Payer: Self-pay | Admitting: Cardiovascular Disease

## 2011-05-13 MED ORDER — CLOPIDOGREL BISULFATE 75 MG PO TABS: 75.0000 mg | ORAL_TABLET | Freq: Every day | ORAL | Status: DC

## 2011-05-13 NOTE — Telephone Encounter (Signed)
Pt calling regarding pt getting (generic) plavix. Pt said he used to get plavix through [place] (our office).   Pt would like 3 month supply. Pt also wanted to know the price difference for a one month and three month supply.

## 2011-05-13 NOTE — Telephone Encounter (Signed)
Rx refill called in to [facility] pharmacy - Clopidogrel 75mg  1 month suplly plus 3 refills. Patient notified. Vikki Ports

## 2011-07-09 ENCOUNTER — Other Ambulatory Visit: Payer: Self-pay | Admitting: Cardiovascular Disease

## 2011-07-22 ENCOUNTER — Other Ambulatory Visit (INDEPENDENT_AMBULATORY_CARE_PROVIDER_SITE_OTHER): Payer: Medicare Other | Admitting: *Deleted

## 2011-07-22 DIAGNOSIS — I252 Old myocardial infarction: Secondary | ICD-10-CM

## 2011-07-22 DIAGNOSIS — I5022 Chronic systolic (congestive) heart failure: Secondary | ICD-10-CM

## 2011-07-22 LAB — HEPATIC FUNCTION PANEL
Albumin: 4 g/dL (ref 3.5–5.2)
Alkaline Phosphatase: 63 U/L (ref 39–117)
Bilirubin, Direct: 0 mg/dL (ref 0.0–0.3)
Total Bilirubin: 1 mg/dL (ref 0.3–1.2)

## 2011-07-22 LAB — LIPID PANEL
LDL Cholesterol: 73 mg/dL (ref 0–99)
Total CHOL/HDL Ratio: 3

## 2011-07-22 LAB — BASIC METABOLIC PANEL
CO2: 24 mEq/L (ref 19–32)
Chloride: 107 mEq/L (ref 96–112)
Creatinine, Ser: 1.2 mg/dL (ref 0.4–1.5)

## 2011-07-23 ENCOUNTER — Encounter: Payer: Self-pay | Admitting: Cardiovascular Disease

## 2011-07-23 ENCOUNTER — Ambulatory Visit (INDEPENDENT_AMBULATORY_CARE_PROVIDER_SITE_OTHER): Payer: Medicare Other | Admitting: Cardiovascular Disease

## 2011-07-23 VITALS — BP 140/78 | HR 77 | Ht 71.0 in | Wt 170.8 lb

## 2011-07-23 DIAGNOSIS — E785 Hyperlipidemia, unspecified: Secondary | ICD-10-CM

## 2011-07-23 DIAGNOSIS — I2510 Atherosclerotic heart disease of native coronary artery without angina pectoris: Secondary | ICD-10-CM

## 2011-07-23 DIAGNOSIS — I5022 Chronic systolic (congestive) heart failure: Secondary | ICD-10-CM

## 2011-07-23 NOTE — Progress Notes (Signed)
HPI:  65 year old gentleman presenting for followup evaluation.  The patient has coronary artery disease with an anterior wall MI complicated by out-of-hospital VF arrest in 2009. He was treated with bare-metal stenting of the proximal LAD. He ultimately required ICD implantation for severe residual LV dysfunction.  He has had no recurrent ischemic event since his initial presentation.  The patient is stable from a symptomatic perspective. He continues to complain of fatigue in the morning with some shortness of breath right after his shower. He is able to do yard work and normal activities without exertional symptoms. He specifically denies exertional chest pain or tightness. He denies orthopnea, PND, lightheadedness, or palpitations.  Outpatient Encounter Prescriptions as of 07/23/2011  Medication Sig Dispense Refill  . aspirin (ASPIR-81) 81 MG EC tablet Take 81 mg by mouth daily.        . carvedilol (COREG) 25 MG tablet TAKE ONE TABLET TWICE DAILY  62 tablet  6  . clopidogrel (PLAVIX) 75 MG tablet Take 1 tablet (75 mg total) by mouth daily.  30 tablet  3  . digoxin (LANOXIN) 0.125 MG tablet TAKE ONE TABLET BY MOUTH ONE TIME DAILY  30 tablet  4  . furosemide (LASIX) 40 MG tablet Take 40 mg by mouth daily.        Marland Kitchen lisinopril (PRINIVIL,ZESTRIL) 20 MG tablet TAKE ONE TABLET BY MOUTH DAILY  90 tablet  5  . nitroGLYCERIN (NITROSTAT) 0.4 MG SL tablet Place 0.4 mg under the tongue as directed.        . simvastatin (ZOCOR) 40 MG tablet TAKE ONE TABLET BY MOUTH DAILY  90 tablet  0    Allergies  Allergen Reactions  . Niacin     Past Medical History  Diagnosis Date  . Other and unspecified hyperlipidemia   . Other specified forms of chronic ischemic heart disease     w sever residual LV dysfunc., LVEF less than 30%  . Acute myocardial infarction of other anterior wall, initial episode of care   . Coronary atherosclerosis of native coronary artery     status post anterior MI 2008 w V. fib arrest    . Ventricular fibrillation     implantable cardiac defib ?    ROS: Negative except as per HPI  BP 140/78  Pulse 77  Ht 5\' 11"  (1.803 m)  Wt 77.474 kg (170 lb 12.8 oz)  BMI 23.82 kg/m2  PHYSICAL EXAM: Pt is alert and oriented, NAD HEENT: normal Neck: JVP - normal, carotids 2+= without bruits Lungs: CTA bilaterally CV: RRR without murmur or gallop Abd: soft, NT, Positive BS, no hepatomegaly Ext: no C/C/E, distal pulses intact and equal Skin: warm/dry no rash  EKG:  Normal sinus rhythm 64 beats per minute, age indeterminate anteroseptal infarct, T wave abnormality consider lateral ischemia. No significant change from previous tracings.  ASSESSMENT AND PLAN:

## 2011-07-23 NOTE — Patient Instructions (Signed)
Your physician has recommended you make the following change in your medication: STOP Plavix  Your physician wants you to follow-up in: 6 MONTHS. You will receive a reminder letter in the mail two months in advance. If you don't receive a letter, please call our office to schedule the follow-up appointment.  

## 2011-07-30 ENCOUNTER — Encounter: Payer: Self-pay | Admitting: Internal Medicine

## 2011-07-30 ENCOUNTER — Ambulatory Visit (INDEPENDENT_AMBULATORY_CARE_PROVIDER_SITE_OTHER): Payer: Medicare Other | Admitting: *Deleted

## 2011-07-30 ENCOUNTER — Other Ambulatory Visit: Payer: Self-pay | Admitting: Internal Medicine

## 2011-07-30 DIAGNOSIS — I4901 Ventricular fibrillation: Secondary | ICD-10-CM

## 2011-07-31 LAB — REMOTE ICD DEVICE
BRDY-0002RV: 40 {beats}/min
TZAT-0001FASTVT: 1
TZAT-0001SLOWVT: 1
TZAT-0004FASTVT: 8
TZAT-0004SLOWVT: 8
TZAT-0005SLOWVT: 84 pct
TZAT-0005SLOWVT: 91 pct
TZAT-0011SLOWVT: 10 ms
TZAT-0012SLOWVT: 200 ms
TZAT-0013SLOWVT: 2
TZAT-0018SLOWVT: NEGATIVE
TZAT-0020FASTVT: 1.6 ms
TZAT-0020SLOWVT: 1.6 ms
TZON-0003FASTVT: 240 ms
TZON-0008SLOWVT: 0 ms
TZST-0001FASTVT: 2
TZST-0001FASTVT: 4
TZST-0001FASTVT: 6
TZST-0001SLOWVT: 6
TZST-0003FASTVT: 25 J
TZST-0003FASTVT: 35 J
TZST-0003SLOWVT: 25 J
TZST-0003SLOWVT: 35 J

## 2011-08-05 NOTE — Assessment & Plan Note (Signed)
Lipids were drawn December 19 and they were reviewed. His lipids are at goal. He will continue on his current medical program with focus on dietary and lifestyle modification

## 2011-08-05 NOTE — Assessment & Plan Note (Signed)
The patient is stable without angina. He is now several years out from PCI of the proximal LAD utilizing a bare-metal stent platform. I asked him to discontinue Plavix at this time as there is no further indication. He will remain on antiplatelet therapy with low-dose aspirin. He will remain on his current medical program which includes a beta blocker and ACE inhibitor.  Regular exercise was encouraged.

## 2011-08-06 NOTE — Progress Notes (Signed)
Remote defib check  

## 2011-08-19 ENCOUNTER — Encounter: Payer: Self-pay | Admitting: *Deleted

## 2011-09-01 ENCOUNTER — Other Ambulatory Visit: Payer: Self-pay | Admitting: Cardiovascular Disease

## 2011-10-09 ENCOUNTER — Other Ambulatory Visit: Payer: Self-pay | Admitting: Cardiovascular Disease

## 2011-10-12 ENCOUNTER — Telehealth: Payer: Self-pay

## 2011-10-12 MED ORDER — SIMVASTATIN 40 MG PO TABS: 40.0000 mg | ORAL_TABLET | Freq: Every day | ORAL | Status: DC

## 2011-10-12 NOTE — Telephone Encounter (Signed)
..   Requested Prescriptions   Signed Prescriptions Disp Refills  . simvastatin (ZOCOR) 40 MG tablet 90 tablet 3    Sig: Take 1 tablet (40 mg total) by mouth at bedtime.    Authorizing Provider: Tonny Bollman    Ordering User: Sharalyn Lomba M  Resent med refill, the original was not received.

## 2011-10-27 ENCOUNTER — Encounter: Payer: Self-pay | Admitting: Internal Medicine

## 2011-10-27 ENCOUNTER — Ambulatory Visit (INDEPENDENT_AMBULATORY_CARE_PROVIDER_SITE_OTHER): Payer: Medicare Other | Admitting: Internal Medicine

## 2011-10-27 VITALS — BP 110/72 | HR 80 | Ht 71.0 in | Wt 179.8 lb

## 2011-10-27 DIAGNOSIS — I4901 Ventricular fibrillation: Secondary | ICD-10-CM

## 2011-10-27 DIAGNOSIS — I2589 Other forms of chronic ischemic heart disease: Secondary | ICD-10-CM

## 2011-10-27 DIAGNOSIS — E785 Hyperlipidemia, unspecified: Secondary | ICD-10-CM

## 2011-10-27 DIAGNOSIS — Z95810 Presence of automatic (implantable) cardiac defibrillator: Secondary | ICD-10-CM

## 2011-10-27 LAB — ICD DEVICE OBSERVATION
DEV-0020ICD: NEGATIVE
FVT: 0
PACEART VT: 0
RV LEAD AMPLITUDE: 10.3 mv
RV LEAD THRESHOLD: 1 V
TZAT-0004SLOWVT: 6
TZAT-0004SLOWVT: 8
TZAT-0005SLOWVT: 84 pct
TZAT-0005SLOWVT: 91 pct
TZAT-0011FASTVT: 10 ms
TZAT-0011SLOWVT: 10 ms
TZAT-0011SLOWVT: 10 ms
TZAT-0012FASTVT: 200 ms
TZAT-0013SLOWVT: 2
TZAT-0013SLOWVT: 2
TZAT-0018SLOWVT: NEGATIVE
TZAT-0018SLOWVT: NEGATIVE
TZAT-0019FASTVT: 8 V
TZON-0003FASTVT: 240 ms
TZON-0004SLOWVT: 24
TZON-0005SLOWVT: 12
TZON-0008FASTVT: 0 ms
TZON-0011AFLUTTER: 70
TZST-0001FASTVT: 3
TZST-0001FASTVT: 5
TZST-0001SLOWVT: 4
TZST-0001SLOWVT: 6
TZST-0003FASTVT: 25 J
TZST-0003FASTVT: 35 J
TZST-0003SLOWVT: 35 J

## 2011-10-27 NOTE — Assessment & Plan Note (Signed)
He denies anginal symptoms. He will continue his current medical therapy. 

## 2011-10-27 NOTE — Progress Notes (Signed)
HPI [name] returns today for followup. He is a very pleasant 66 year old man with a history of an ischemic cardiomyopathy status post myocardial infarction. He has chronic class II congestive heart failure. His ejection fraction is 25%. In the interim, he has done well. He denies chest pain, shortness of breath, or syncope. He complains of some numbness in his foot. No ICD shocks. No Known Allergies   Current Outpatient Prescriptions  Medication Sig Dispense Refill  . aspirin (ASPIR-81) 81 MG EC tablet Take 81 mg by mouth daily.        . carvedilol (COREG) 25 MG tablet TAKE ONE TABLET TWICE DAILY  60 tablet  5  . digoxin (LANOXIN) 0.125 MG tablet TAKE ONE TABLET BY MOUTH ONE TIME DAILY  30 tablet  4  . furosemide (LASIX) 40 MG tablet Take 40 mg by mouth daily.        Marland Kitchen lisinopril (PRINIVIL,ZESTRIL) 20 MG tablet TAKE ONE TABLET BY MOUTH DAILY  90 tablet  5  . nitroGLYCERIN (NITROSTAT) 0.4 MG SL tablet Place 0.4 mg under the tongue as directed.        . simvastatin (ZOCOR) 40 MG tablet Take 1 tablet (40 mg total) by mouth at bedtime.  90 tablet  3     Past Medical History  Diagnosis Date  . Other and unspecified hyperlipidemia   . Other specified forms of chronic ischemic heart disease     w sever residual LV dysfunc., LVEF less than 30%  . Acute myocardial infarction of other anterior wall, initial episode of care   . Coronary atherosclerosis of native coronary artery     status post anterior MI 2008 w V. fib arrest  . Ventricular fibrillation     implantable cardiac defib ?    ROS:   All systems reviewed and negative except as noted in the HPI.   Past Surgical History  Procedure Date  . Icd medtronic implanted 07/06/08    Dr. Sharrell Ku  . Cardiac catheterization 09/12/2007     Family History  Problem Relation Age of Onset  . Heart attack Mother      History   Social History  . Marital Status: Married    Spouse Name: N/A    Number of Children: N/A  . Years of  Education: N/A   Occupational History  . Not on file.   Social History Main Topics  . Smoking status: Former Games developer  . Smokeless tobacco: Not on file  . Alcohol Use: Not on file  . Drug Use: Not on file  . Sexually Active: Not on file   Other Topics Concern  . Not on file   Social History Narrative  . No narrative on file     BP 110/72  Pulse 80  Ht 5\' 11"  (1.803 m)  Wt 81.557 kg (179 lb 12.8 oz)  BMI 25.08 kg/m2  Physical Exam:  Well appearing middle-aged man, NAD HEENT: Unremarkable Neck:  No JVD, no thyromegally Lungs:  Clear with no wheezes, rales, or rhonchi. HEART:  Regular rate rhythm, no murmurs, no rubs, no clicks Abd:  soft, positive bowel sounds, no organomegally, no rebound, no guarding Ext:  2 plus pulses, no edema, no cyanosis, no clubbing Skin:  No rashes no nodules Neuro:  CN II through XII intact, motor grossly intact  DEVICE  Normal device function.  See PaceArt for details.   Assess/Plan:

## 2011-10-27 NOTE — Patient Instructions (Signed)
Your physician wants you to follow-up in: 12 months with Dr Taylor You will receive a reminder letter in the mail two months in advance. If you don't receive a letter, please call our office to schedule the follow-up appointment.   Remote monitoring is used to monitor your Pacemaker of ICD from home. This monitoring reduces the number of office visits required to check your device to one time per year. It allows us to keep an eye on the functioning of your device to ensure it is working properly. You are scheduled for a device check from home on 01/28/2012. You may send your transmission at any time that day. If you have a wireless device, the transmission will be sent automatically. After your physician reviews your transmission, you will receive a postcard with your next transmission date.   

## 2011-10-27 NOTE — Assessment & Plan Note (Signed)
He will continue his simvastatin and maintain a low fat diet.

## 2011-10-27 NOTE — Assessment & Plan Note (Signed)
His device is working normally. Today we'll change the VT detection rate from 176-194.

## 2012-01-09 ENCOUNTER — Other Ambulatory Visit: Payer: Self-pay | Admitting: Cardiovascular Disease

## 2012-01-26 ENCOUNTER — Ambulatory Visit (INDEPENDENT_AMBULATORY_CARE_PROVIDER_SITE_OTHER): Payer: Medicare Other | Admitting: Cardiovascular Disease

## 2012-01-26 ENCOUNTER — Encounter: Payer: Self-pay | Admitting: Cardiovascular Disease

## 2012-01-26 VITALS — BP 170/80 | HR 75 | Ht 71.0 in | Wt 176.0 lb

## 2012-01-26 DIAGNOSIS — E785 Hyperlipidemia, unspecified: Secondary | ICD-10-CM

## 2012-01-26 DIAGNOSIS — I2510 Atherosclerotic heart disease of native coronary artery without angina pectoris: Secondary | ICD-10-CM

## 2012-01-26 NOTE — Patient Instructions (Addendum)
Your physician has requested that you regularly monitor and record your blood pressure readings at home. Please use the same machine at the same time of day to check your readings and record them to bring to your follow-up visit.  (Please check your BP a few times per week, please call the office if your BP is consistently above 140/90)  Your physician wants you to follow-up in: January 2014. You will receive a reminder letter in the mail two months in advance. If you don't receive a letter, please call our office to schedule the follow-up appointment.  Your physician recommends that you return for a FASTING Lipid, Liver, BMP and CBC in December 2013--nothing to eat or drink after midnight.    Your physician recommends that you continue on your current medications as directed. Please refer to the Current Medication list given to you today.

## 2012-01-28 ENCOUNTER — Encounter: Payer: Self-pay | Admitting: Internal Medicine

## 2012-01-28 ENCOUNTER — Ambulatory Visit (INDEPENDENT_AMBULATORY_CARE_PROVIDER_SITE_OTHER): Payer: Medicare Other | Admitting: *Deleted

## 2012-01-28 DIAGNOSIS — Z95810 Presence of automatic (implantable) cardiac defibrillator: Secondary | ICD-10-CM

## 2012-01-28 DIAGNOSIS — I4901 Ventricular fibrillation: Secondary | ICD-10-CM

## 2012-01-29 ENCOUNTER — Encounter: Payer: Self-pay | Admitting: Cardiovascular Disease

## 2012-01-29 NOTE — Progress Notes (Signed)
HPI:  66 year old gentleman presenting for followup evaluation. The patient has coronary artery disease initially diagnosed in 2009 when he presented with an out-of-hospital cardiac arrest in the setting of an anterior myocardial infarction. He underwent primary PCI of the LAD using a bare-metal stent. He has had severe residual LV dysfunction and has undergone ICD implantation.  Patient remained stable from a symptomatic perspective. He's been able to do yard work and regular physical activities without symptoms. Overall he feels well. He specifically denies chest pain or pressure, orthopnea, PND, lightheadedness, edema, palpitations, or syncope. He has mild dyspnea with exertion.  Outpatient Encounter Prescriptions as of 01/26/2012  Medication Sig Dispense Refill  . aspirin (ASPIR-81) 81 MG EC tablet Take 81 mg by mouth daily.        . carvedilol (COREG) 25 MG tablet TAKE ONE TABLET TWICE DAILY  60 tablet  5  . digoxin (LANOXIN) 0.125 MG tablet TAKE ONE TABLET BY MOUTH ONE TIME DAILY  30 tablet  4  . furosemide (LASIX) 40 MG tablet Take 40 mg by mouth daily.        Marland Kitchen lisinopril (PRINIVIL,ZESTRIL) 20 MG tablet TAKE ONE TABLET BY MOUTH DAILY  90 tablet  3  . nitroGLYCERIN (NITROSTAT) 0.4 MG SL tablet Place 0.4 mg under the tongue as directed.        . simvastatin (ZOCOR) 40 MG tablet Take 1 tablet (40 mg total) by mouth at bedtime.  90 tablet  3    No Known Allergies  Past Medical History  Diagnosis Date  . Other and unspecified hyperlipidemia   . Other specified forms of chronic ischemic heart disease     w sever residual LV dysfunc., LVEF less than 30%  . Acute myocardial infarction of other anterior wall, initial episode of care   . Coronary atherosclerosis of native coronary artery     status post anterior MI 2008 w V. fib arrest  . Ventricular fibrillation     implantable cardiac defib ?    ROS: Negative except as per HPI  BP 170/80  Pulse 75  Ht 5\' 11"  (1.803 m)  Wt 79.833  kg (176 lb)  BMI 24.55 kg/m2  PHYSICAL EXAM: Pt is alert and oriented, NAD HEENT: normal Neck: JVP - normal, carotids 2+= without bruits Lungs: CTA bilaterally CV: RRR without murmur or gallop Abd: soft, NT, Positive BS, no hepatomegaly Ext: no C/C/E, distal pulses intact and equal Skin: warm/dry no rash  EKG:  Normal sinus rhythm 75 beats per minute, anteroseptal infarct age undetermined, ST and T wave abnormality consider inferolateral ischemia.  ASSESSMENT AND PLAN: 1. Coronary artery disease status post myocardial infarction. He remained stable on aspirin, beta blocker, ACE inhibitor, and statin drug.  2. Essential hypertension. Patient's blood pressure is elevated today. He reports good control when he stops the pharmacy and checks his blood pressure. His last office reading were reviewed and they have not been out of range. Continue his current medicines for now. I have asked him to obtain home blood pressure cuff and to record blood pressures 3 times a week. He understands to call if his readings are greater than 140/90.  3. Hyperlipidemia. Lipids from December 2012 showed a cholesterol of 160, triglycerides 147, HDL 57, and LDL 73. His liver function tests were within normal limits.  4. Ischemic cardiomyopathy. Stable on a combination of digoxin, furosemide, lisinopril, and carvedilol. He is New York Heart Association class 1-2 present.  Tonny Bollman 01/29/2012 10:45 PM

## 2012-02-01 LAB — REMOTE ICD DEVICE
BATTERY VOLTAGE: 3.07 V
CHARGE TIME: 8.66 s
DEV-0020ICD: NEGATIVE
RV LEAD IMPEDENCE ICD: 576 Ohm
TOT-0006: 20130326000000
TZAT-0001FASTVT: 1
TZAT-0004FASTVT: 8
TZAT-0004SLOWVT: 6
TZAT-0004SLOWVT: 8
TZAT-0005FASTVT: 88 pct
TZAT-0005SLOWVT: 84 pct
TZAT-0005SLOWVT: 91 pct
TZAT-0011FASTVT: 10 ms
TZAT-0011SLOWVT: 10 ms
TZAT-0011SLOWVT: 10 ms
TZAT-0012SLOWVT: 200 ms
TZAT-0012SLOWVT: 200 ms
TZAT-0013FASTVT: 1
TZAT-0013SLOWVT: 2
TZAT-0018FASTVT: NEGATIVE
TZAT-0020SLOWVT: 1.6 ms
TZON-0003SLOWVT: 310 ms
TZON-0004SLOWVT: 24
TZON-0011AFLUTTER: 70
TZST-0001FASTVT: 2
TZST-0001FASTVT: 3
TZST-0001FASTVT: 5
TZST-0001SLOWVT: 4
TZST-0001SLOWVT: 5
TZST-0001SLOWVT: 6
TZST-0003FASTVT: 35 J
TZST-0003FASTVT: 35 J
TZST-0003FASTVT: 35 J
TZST-0003SLOWVT: 15 J
TZST-0003SLOWVT: 35 J
VENTRICULAR PACING ICD: 0 pct

## 2012-02-18 ENCOUNTER — Telehealth: Payer: Self-pay | Admitting: Cardiovascular Disease

## 2012-02-18 NOTE — Telephone Encounter (Signed)
I spoke with the pt's wife and she wanted to make [place] aware that the pt almost passed out this weekend. They went to the beach over the weekend and on Saturday the pt stayed on the beach for about an hour and a half.  The pt did not drink water but did drink coffee and beer.  They drove back to beach house and when the pt got out he walked around the car to get the cooler, the pt fell against the car and then stumbled to the ground. The pt's wife said he did not pass out and quickly got up from the ground. The pt did complain of dizziness. The pt then went inside and took a nap.  They went out to eat dinner and the pt complained of still feeling hot inside.  They came home from the beach on Sunday and the pt has been fine, BP okay. She wanted to make [place] aware of this episode and have it documented in the pt's chart.  I made his wife aware that the pt most likely got dehydrated and his BP could have dropped.  She also wanted to make [place] aware that the pt is not taking Furosemide.  I made her aware that this is an important medication and the pt needs to take medications as prescribed. She will speak with the pt and make him aware that she contacted our office. I will forward this message to Dr Excell Seltzer to make him aware.

## 2012-02-18 NOTE — Telephone Encounter (Signed)
Please return call to patient wife [name] [phone]  Patient got overheated while on vacation the beach, previous cardiac arrest.  Wife would like to discuss with Nurse.

## 2012-02-18 NOTE — Telephone Encounter (Signed)
Agree with recommendations as outlined.

## 2012-02-22 ENCOUNTER — Other Ambulatory Visit: Payer: Self-pay | Admitting: Cardiovascular Disease

## 2012-02-22 NOTE — Telephone Encounter (Signed)
Refilled furosemide

## 2012-02-26 ENCOUNTER — Encounter: Payer: Self-pay | Admitting: *Deleted

## 2012-02-29 ENCOUNTER — Other Ambulatory Visit: Payer: Self-pay | Admitting: Cardiovascular Disease

## 2012-04-02 ENCOUNTER — Other Ambulatory Visit: Payer: Self-pay | Admitting: Cardiovascular Disease

## 2012-05-02 ENCOUNTER — Ambulatory Visit (INDEPENDENT_AMBULATORY_CARE_PROVIDER_SITE_OTHER): Payer: Medicare Other | Admitting: *Deleted

## 2012-05-02 DIAGNOSIS — I4901 Ventricular fibrillation: Secondary | ICD-10-CM

## 2012-05-02 LAB — REMOTE ICD DEVICE
BATTERY VOLTAGE: 3.09 V
CHARGE TIME: 8.66 s
RV LEAD IMPEDENCE ICD: 568 Ohm
TOT-0006: 20130627000000
TZAT-0004SLOWVT: 6
TZAT-0004SLOWVT: 8
TZAT-0005FASTVT: 88 pct
TZAT-0005SLOWVT: 84 pct
TZAT-0005SLOWVT: 91 pct
TZAT-0011FASTVT: 10 ms
TZAT-0011SLOWVT: 10 ms
TZAT-0011SLOWVT: 10 ms
TZAT-0012SLOWVT: 200 ms
TZAT-0012SLOWVT: 200 ms
TZAT-0013FASTVT: 1
TZAT-0013SLOWVT: 2
TZAT-0013SLOWVT: 2
TZAT-0018FASTVT: NEGATIVE
TZAT-0020SLOWVT: 1.6 ms
TZON-0003SLOWVT: 310 ms
TZON-0004SLOWVT: 24
TZON-0011AFLUTTER: 70
TZST-0001FASTVT: 3
TZST-0001FASTVT: 5
TZST-0001SLOWVT: 4
TZST-0001SLOWVT: 6
TZST-0003FASTVT: 35 J
TZST-0003FASTVT: 35 J
TZST-0003SLOWVT: 25 J
TZST-0003SLOWVT: 35 J

## 2012-05-03 NOTE — Progress Notes (Signed)
Remote defib check  

## 2012-05-11 ENCOUNTER — Encounter: Payer: Self-pay | Admitting: *Deleted

## 2012-05-18 ENCOUNTER — Encounter: Payer: Self-pay | Admitting: Internal Medicine

## 2012-06-15 ENCOUNTER — Telehealth: Payer: Self-pay | Admitting: Cardiovascular Disease

## 2012-06-15 MED ORDER — LISINOPRIL 40 MG PO TABS: 40.0000 mg | ORAL_TABLET | Freq: Every day | ORAL | Status: DC

## 2012-06-15 NOTE — Telephone Encounter (Signed)
I spoke with the pt's wife and the pt has been monitoring his BP since his appointment in June.  His BP had been okay but over the last month his BP has increased.  Per [name] the pt's BP has been on average 150/70 on medications.  The pt's DBP has also ran in the 80-90 range a couple of times.  The pt would like Dr Excell Seltzer to review and make recommendations if needed.

## 2012-06-15 NOTE — Telephone Encounter (Signed)
Left message to call back  

## 2012-06-15 NOTE — Telephone Encounter (Signed)
I spoke with the pt's wife and made her aware of Dr Earmon Phoenix recommendation. New Rx sent to pharmacy.

## 2012-06-15 NOTE — Telephone Encounter (Signed)
Recommend increase lisinopril 40 mg daily.

## 2012-06-15 NOTE — Telephone Encounter (Signed)
plz return call to pt wife [phone], pt BP Elevated of the course of the last month.

## 2012-06-24 ENCOUNTER — Telehealth: Payer: Self-pay | Admitting: Cardiovascular Disease

## 2012-06-24 MED ORDER — HYDROCHLOROTHIAZIDE 25 MG PO TABS: 25.0000 mg | ORAL_TABLET | Freq: Every day | ORAL | Status: DC

## 2012-06-24 NOTE — Telephone Encounter (Signed)
I spoke with the pt's wife and the pt's BP remains elevated.  The pt did increase his Lisinopril to 40mg  daily starting 06/15/12.  The pt's BP is still running 150-160 on average with an occasional 170-180.  The pt did check his BP yesterday at the pharmacy and this was similar to his home BP. She also said the pt has had numbness in his left foot and pain when he puts pressure on foot.  This has been on going for a couple of months.  I instructed her that this should be evaluated by PCP. I will forward this information to Dr Excell Seltzer for review of BP.

## 2012-06-24 NOTE — Telephone Encounter (Signed)
Pt's wife aware of medication change. Rx sent to pharmacy.  The pt will have labs rechecked on 07/11/12.

## 2012-06-24 NOTE — Telephone Encounter (Signed)
Pt's wife calling re pt's BP

## 2012-06-24 NOTE — Telephone Encounter (Signed)
Recommend add hydrochlorothiazide 25 mg daily and followup with a metabolic panel in 2 weeks.

## 2012-07-11 ENCOUNTER — Other Ambulatory Visit (INDEPENDENT_AMBULATORY_CARE_PROVIDER_SITE_OTHER): Payer: Medicare Other

## 2012-07-11 DIAGNOSIS — E785 Hyperlipidemia, unspecified: Secondary | ICD-10-CM

## 2012-07-11 DIAGNOSIS — I2510 Atherosclerotic heart disease of native coronary artery without angina pectoris: Secondary | ICD-10-CM

## 2012-07-11 LAB — HEPATIC FUNCTION PANEL
ALT: 25 U/L (ref 0–53)
Albumin: 3.8 g/dL (ref 3.5–5.2)
Alkaline Phosphatase: 60 U/L (ref 39–117)
Bilirubin, Direct: 0.1 mg/dL (ref 0.0–0.3)
Total Protein: 7.1 g/dL (ref 6.0–8.3)

## 2012-07-11 LAB — CBC WITH DIFFERENTIAL/PLATELET
Basophils Absolute: 0 10*3/uL (ref 0.0–0.1)
Eosinophils Relative: 2.8 % (ref 0.0–5.0)
HCT: 47.2 % (ref 39.0–52.0)
Hemoglobin: 15.6 g/dL (ref 13.0–17.0)
Lymphocytes Relative: 30.7 % (ref 12.0–46.0)
Lymphs Abs: 2.5 10*3/uL (ref 0.7–4.0)
Monocytes Relative: 6.3 % (ref 3.0–12.0)
Neutro Abs: 4.9 10*3/uL (ref 1.4–7.7)
Platelets: 251 10*3/uL (ref 150.0–400.0)
WBC: 8.1 10*3/uL (ref 4.5–10.5)

## 2012-07-11 LAB — BASIC METABOLIC PANEL
CO2: 26 mEq/L (ref 19–32)
Calcium: 8.9 mg/dL (ref 8.4–10.5)
Chloride: 106 mEq/L (ref 96–112)
Glucose, Bld: 96 mg/dL (ref 70–99)
Sodium: 140 mEq/L (ref 135–145)

## 2012-07-11 LAB — LIPID PANEL
HDL: 37.8 mg/dL — ABNORMAL LOW (ref >39.00)
Total CHOL/HDL Ratio: 3

## 2012-07-20 ENCOUNTER — Other Ambulatory Visit: Payer: Medicare Other

## 2012-07-21 ENCOUNTER — Encounter: Payer: Self-pay | Admitting: Cardiovascular Disease

## 2012-07-21 ENCOUNTER — Ambulatory Visit (INDEPENDENT_AMBULATORY_CARE_PROVIDER_SITE_OTHER): Payer: Medicare Other | Admitting: Cardiovascular Disease

## 2012-07-21 VITALS — BP 148/82 | HR 77 | Ht 71.0 in | Wt 177.0 lb

## 2012-07-21 DIAGNOSIS — K4090 Unilateral inguinal hernia, without obstruction or gangrene, not specified as recurrent: Secondary | ICD-10-CM

## 2012-07-21 DIAGNOSIS — E785 Hyperlipidemia, unspecified: Secondary | ICD-10-CM

## 2012-07-21 DIAGNOSIS — I2510 Atherosclerotic heart disease of native coronary artery without angina pectoris: Secondary | ICD-10-CM

## 2012-07-21 DIAGNOSIS — R0989 Other specified symptoms and signs involving the circulatory and respiratory systems: Secondary | ICD-10-CM

## 2012-07-21 DIAGNOSIS — I10 Essential (primary) hypertension: Secondary | ICD-10-CM

## 2012-07-21 DIAGNOSIS — R0602 Shortness of breath: Secondary | ICD-10-CM

## 2012-07-21 DIAGNOSIS — I2589 Other forms of chronic ischemic heart disease: Secondary | ICD-10-CM

## 2012-07-21 MED ORDER — HYDROCHLOROTHIAZIDE 50 MG PO TABS: 50.0000 mg | ORAL_TABLET | Freq: Every day | ORAL | Status: DC

## 2012-07-21 NOTE — Assessment & Plan Note (Signed)
We will evaluate with carotid duplex. He is asymptomatic without lightheadedness, syncope, or presyncope.

## 2012-07-21 NOTE — Assessment & Plan Note (Addendum)
Increasingly causing the patient discomfort, but not concerning for bowel incarceration. He may require surgical repair soon, but will first need evaluation of current cardiovascular status. We will order carotid duplex and echocardiography. He has been encouraged to establish care with a primary care provider. His wife states that she is arranging for him to see [place] provider.  Patient seen, examined. Available data reviewed. Agree with findings, assessment, and plan as outlined by Dr Earlene Plater. The patient is very well-known to me. I'm concerned about his continued dyspnea with exertion. Considering his known severe LV dysfunction, will repeat an echocardiogram as it has been 2 years since his last study. His exam reveals a large right inguinal hernia and I suspect he will need surgical repair. His cardiovascular and respiratory exam is otherwise unrevealing. His heart is regular rate and rhythm without murmurs. The lungs sounds are clear. His blood pressure control is suboptimal and will increase his hydrochlorothiazide to 50 mg. I counseled him on increasing potassium in his diet. Metabolic panel will be checked in 2 weeks.  Tonny Bollman, M.D. 07/21/2012 11:04 AM

## 2012-07-21 NOTE — Assessment & Plan Note (Addendum)
Despite addition of a second agent, he remains hypertensive.  We will increase HCTZ from 25mg  to 50mg  today and plan to recheck electrolytes in 2 weeks.  Consider addition of spironolactone in the future if blood pressure control not achieved. I encouraged the patient to cut back on salt and alcohol. - increase HCTZ from 25 to 50mg  daily - continue carvedilol 25mg  BID - continue lisinopril 40mg  daily  BMET Component Value Date/Time   NA 140 07/11/2012 0734   K 3.5 07/11/2012 0734   CL 106 07/11/2012 0734   CO2 26 07/11/2012 0734   GLUCOSE 96 07/11/2012 0734   BUN 10 07/11/2012 0734   CREATININE 1.1 07/11/2012 0734   CALCIUM 8.9 07/11/2012 0734

## 2012-07-21 NOTE — Assessment & Plan Note (Signed)
Exertional dyspnea may be progressing slightly, but it is reassuring that he is not experiencing orthopnea and lower extremity edema.  His weight is stable.  We will continue his current medical regimen and evaluate cardiac function with an echocardiogram. - continue aspirin 81 daily, carvedilol 25 BID, furosemide 40 daily, and digoxin 0.125 daily - echocardiogram

## 2012-07-21 NOTE — Patient Instructions (Signed)
Your physician has requested that you have an echocardiogram. Echocardiography is a painless test that uses sound waves to create images of your heart. It provides your doctor with information about the size and shape of your heart and how well your heart's chambers and valves are working. This procedure takes approximately one hour. There are no restrictions for this procedure.  Your physician has requested that you have a carotid duplex. This test is an ultrasound of the carotid arteries in your neck. It looks at blood flow through these arteries that supply the brain with blood. Allow one hour for this exam. There are no restrictions or special instructions.  Your physician recommends that you return for lab work in: 2-3 WEEKS (BMP)  Your physician has recommended you make the following change in your medication: INCREASE HCTZ 50mg  take one by mouth daily  Your physician recommends that you schedule a follow-up appointment in: 3 MONTHS with Dr Excell Seltzer

## 2012-07-21 NOTE — Progress Notes (Signed)
HPI:  66 year old man here for follow up.  He was diagnosed with CAD following an out-of-hospital cardiac arrest in 2009 precipitated by an anterior MI. He had PCI with bare-metal stenting of the LAD then. Since then, he has had persistent systolic dysfunction of the LV and has had an ICD placed.  He recently had HCTZ 25mg  added for blood pressure control. Since starting this, his systolic blood pressures have ranged from the 130s to the 150s.  His most dominant symptom that is limiting him now is an unsteady gait.  It is a problem for him particularly walking down-grade and when climbing or descending stairs.  It is not dizziness, lightheadedness, or vertigo.  He occasionally needs a cane for assistance.  His exertional dyspnea is slightly worse than before.  He can walk at least 150 yards without any symptoms but is eventually limited by fatigue and dyspnea.  He never experiences chest pain or tightness.  He also denies orthopnea, palpitations, and swelling. With walking the other day, he did have some pain in his lower leg; this was an isolated occurrence, and he does not report any leg cramping or discomfort with exertion usually.  On review of systems, he has a number of complaints. He has had intermittent numbness of the right foot predominantly since having a "pinched nerve" in his lumbar spine some years ago. He also has reports an inguinal hernia that is causing discomfort with prolonged standing. He is also complaining of some urinary difficulties that he attributes to his prostate.  Outpatient Encounter Prescriptions as of 07/21/2012  Medication Sig Dispense Refill  . aspirin (ASPIR-81) 81 MG EC tablet Take 81 mg by mouth daily.        . carvedilol (COREG) 25 MG tablet TAKE ONE TABLET TWICE DAILY  60 tablet  9  . digoxin (LANOXIN) 0.125 MG tablet TAKE ONE TABLET BY MOUTH ONE TIME DAILY  30 tablet  9  . furosemide (LASIX) 40 MG tablet TAKE ONE TABLET BY MOUTH DAILY  30 tablet  5  .  hydrochlorothiazide (HYDRODIURIL) 25 MG tablet Take 1 tablet (25 mg total) by mouth daily.  30 tablet  3  . lisinopril (PRINIVIL,ZESTRIL) 40 MG tablet Take 1 tablet (40 mg total) by mouth daily.  30 tablet  11  . nitroGLYCERIN (NITROSTAT) 0.4 MG SL tablet Place 0.4 mg under the tongue as directed.        . simvastatin (ZOCOR) 40 MG tablet Take 1 tablet (40 mg total) by mouth at bedtime.  90 tablet  3    No Known Allergies  Past Medical History  Diagnosis Date  . Other and unspecified hyperlipidemia   . Other specified forms of chronic ischemic heart disease     w sever residual LV dysfunc., LVEF less than 30%  . Acute myocardial infarction of other anterior wall, initial episode of care   . Coronary atherosclerosis of native coronary artery     status post anterior MI 2008 w V. fib arrest  . Ventricular fibrillation     implantable cardiac defib ?    ROS: Negative except as per HPI  Physical Exam: GENERAL: well developed, well nourished; no acute distress HEENT: normal NECK: supple, left cartoid normal, right carotid has a mild bruit LUNGS: clear to auscultation bilaterally, normal work of breathing HEART: normal rate and regular rhythm; normal S1 and S2 without S3 or S4; no murmurs, rubs, or clicks PULSES: radial and posterior tibial 2+ and symmetric ABDOMEN: soft,  non-tender, normal bowel sounds, right inguinal hernia SKIN: warm, dry, intact, normal turgor, no rashes EXTREMITIES: no peripheral edema, no clubbing  Filed Vitals:   07/21/12 0931  BP: 148/82  Pulse: 77    BP Readings from Last 3 Encounters:  07/21/12 148/82  01/26/12 170/80  10/27/11 110/72    EKG Results:  07/21/2012 Rate:  77   PR:  164 QRS:  96 QTc:  423 EKG: unchanged from previous tracings, normal sinus rhythm, Q waves in V2, unchanged T wave inversion in precordial leads and lead I.    ASSESSMENT AND PLAN:

## 2012-07-21 NOTE — Assessment & Plan Note (Signed)
Well controlled on simvastatin 40mg . Continue current pharmacotherapy.  Lipid Panel     Component Value Date/Time   CHOL 128 07/11/2012 0734   TRIG 174.0* 07/11/2012 0734   HDL 37.80* 07/11/2012 0734   LDLCALC 55 07/11/2012 0734

## 2012-07-21 NOTE — Assessment & Plan Note (Addendum)
Stable without angina. Continue medical management with aspirin, ACE inhibitor, statin, and beta-blockade. I have encouraged the patient to increase his exercise as tolerated, and he was counseled on healthy dietary choices.

## 2012-07-25 ENCOUNTER — Encounter (INDEPENDENT_AMBULATORY_CARE_PROVIDER_SITE_OTHER): Payer: Medicare Other

## 2012-07-25 DIAGNOSIS — R0989 Other specified symptoms and signs involving the circulatory and respiratory systems: Secondary | ICD-10-CM

## 2012-07-25 DIAGNOSIS — I2510 Atherosclerotic heart disease of native coronary artery without angina pectoris: Secondary | ICD-10-CM

## 2012-07-25 DIAGNOSIS — R0602 Shortness of breath: Secondary | ICD-10-CM

## 2012-08-07 ENCOUNTER — Encounter: Payer: Self-pay | Admitting: Internal Medicine

## 2012-08-08 ENCOUNTER — Ambulatory Visit (INDEPENDENT_AMBULATORY_CARE_PROVIDER_SITE_OTHER): Payer: Medicare Other | Admitting: *Deleted

## 2012-08-08 DIAGNOSIS — I2589 Other forms of chronic ischemic heart disease: Secondary | ICD-10-CM

## 2012-08-08 DIAGNOSIS — Z95810 Presence of automatic (implantable) cardiac defibrillator: Secondary | ICD-10-CM

## 2012-08-10 LAB — REMOTE ICD DEVICE
BRDY-0002RV: 40 {beats}/min
DEV-0020ICD: NEGATIVE
RV LEAD AMPLITUDE: 10.1 mv
RV LEAD IMPEDENCE ICD: 536 Ohm
TOT-0006: 20130930000000
TZAT-0001FASTVT: 1
TZAT-0005FASTVT: 88 pct
TZAT-0012SLOWVT: 200 ms
TZAT-0012SLOWVT: 200 ms
TZAT-0013FASTVT: 1
TZAT-0013SLOWVT: 2
TZAT-0013SLOWVT: 2
TZAT-0018FASTVT: NEGATIVE
TZAT-0018SLOWVT: NEGATIVE
TZAT-0018SLOWVT: NEGATIVE
TZAT-0019FASTVT: 8 V
TZAT-0019SLOWVT: 8 V
TZAT-0019SLOWVT: 8 V
TZAT-0020SLOWVT: 1.6 ms
TZAT-0020SLOWVT: 1.6 ms
TZON-0003SLOWVT: 310 ms
TZON-0004SLOWVT: 24
TZON-0008FASTVT: 0 ms
TZON-0008SLOWVT: 0 ms
TZST-0001FASTVT: 2
TZST-0001FASTVT: 3
TZST-0001FASTVT: 5
TZST-0001FASTVT: 6
TZST-0001SLOWVT: 5
TZST-0003FASTVT: 35 J
TZST-0003SLOWVT: 25 J
TZST-0003SLOWVT: 35 J
VENTRICULAR PACING ICD: 0 pct

## 2012-08-12 ENCOUNTER — Other Ambulatory Visit (INDEPENDENT_AMBULATORY_CARE_PROVIDER_SITE_OTHER): Payer: Medicare Other

## 2012-08-12 ENCOUNTER — Ambulatory Visit (HOSPITAL_COMMUNITY): Payer: Medicare Other | Attending: Internal Medicine

## 2012-08-12 DIAGNOSIS — I2589 Other forms of chronic ischemic heart disease: Secondary | ICD-10-CM | POA: Insufficient documentation

## 2012-08-12 DIAGNOSIS — R0602 Shortness of breath: Secondary | ICD-10-CM

## 2012-08-12 DIAGNOSIS — I2510 Atherosclerotic heart disease of native coronary artery without angina pectoris: Secondary | ICD-10-CM

## 2012-08-12 DIAGNOSIS — R0989 Other specified symptoms and signs involving the circulatory and respiratory systems: Secondary | ICD-10-CM

## 2012-08-12 DIAGNOSIS — I080 Rheumatic disorders of both mitral and aortic valves: Secondary | ICD-10-CM | POA: Insufficient documentation

## 2012-08-12 DIAGNOSIS — I10 Essential (primary) hypertension: Secondary | ICD-10-CM | POA: Insufficient documentation

## 2012-08-12 LAB — BASIC METABOLIC PANEL
CO2: 25 mEq/L (ref 19–32)
Chloride: 106 mEq/L (ref 96–112)
Creatinine, Ser: 1.3 mg/dL (ref 0.4–1.5)
Sodium: 138 mEq/L (ref 135–145)

## 2012-08-12 NOTE — Progress Notes (Signed)
Echocardiogram performed.  

## 2012-08-16 ENCOUNTER — Encounter: Payer: Self-pay | Admitting: *Deleted

## 2012-08-22 ENCOUNTER — Other Ambulatory Visit: Payer: Medicare Other

## 2012-08-24 ENCOUNTER — Ambulatory Visit: Payer: Medicare Other | Admitting: Cardiovascular Disease

## 2012-08-30 ENCOUNTER — Telehealth: Payer: Self-pay | Admitting: Cardiovascular Disease

## 2012-08-30 NOTE — Telephone Encounter (Signed)
Preliminary results of Echo given to the pt's wife.

## 2012-08-30 NOTE — Telephone Encounter (Signed)
Pt  Had an echo, would like results, also has question

## 2012-08-31 NOTE — Telephone Encounter (Signed)
Reviewed echo result with patient. He has severe residual LV dysfunction after his anterior infarction. He's doing well overall and blood pressure is much improved after increasing hydrochlorothiazide. We'll continue same management and plan on seeing him back in March 25 as scheduled  Tonny Bollman 08/31/2012 5:38 PM

## 2012-10-06 ENCOUNTER — Other Ambulatory Visit: Payer: Self-pay | Admitting: Cardiovascular Disease

## 2012-10-25 ENCOUNTER — Encounter: Payer: Self-pay | Admitting: Cardiovascular Disease

## 2012-10-25 ENCOUNTER — Ambulatory Visit (INDEPENDENT_AMBULATORY_CARE_PROVIDER_SITE_OTHER): Payer: Medicare Other | Admitting: Cardiovascular Disease

## 2012-10-25 ENCOUNTER — Encounter: Payer: Medicare Other | Admitting: Internal Medicine

## 2012-10-25 VITALS — BP 118/74 | HR 85 | Ht 71.0 in | Wt 176.0 lb

## 2012-10-25 DIAGNOSIS — I2510 Atherosclerotic heart disease of native coronary artery without angina pectoris: Secondary | ICD-10-CM

## 2012-10-25 LAB — BASIC METABOLIC PANEL
BUN: 16 mg/dL (ref 6–23)
Calcium: 9.3 mg/dL (ref 8.4–10.5)
Chloride: 104 mEq/L (ref 96–112)
Creatinine, Ser: 1.4 mg/dL (ref 0.4–1.5)

## 2012-10-25 NOTE — Patient Instructions (Addendum)
LABS TODAY:  BMET  Your physician wants you to follow-up in: 6 months with Dr. Excell Seltzer.  You will receive a reminder letter in the mail two months in advance. If you don't receive a letter, please call our office to schedule the follow-up appointment.

## 2012-10-25 NOTE — Progress Notes (Signed)
HPI:  67 year old gentleman presenting for followup evaluation. The patient is followed for coronary artery disease and ischemic cardiomyopathy. He initially presented in 2009 with an anterior MI complicated by cardiac arrest. He has ultimately undergone ICD placement for severe ischemic cardiomyopathy. When he was seen last in December 2013, his antihypertensive regimen was increased.  Overall he is doing well. He brings in home blood pressure readings and they have been excellent. Blood pressures range from 100-128/70s. He has occasional lightheadedness, but no episodes of near-syncope. He denies chest pain, orthopnea, PND, or leg swelling. He takes furosemide occasionally if his weight goes up about 3 pounds from his baseline, but he rarely needs this. He's had a few episodes of palpitations. There have been no ICD discharges.  Outpatient Encounter Prescriptions as of 10/25/2012  Medication Sig Dispense Refill  . aspirin (ASPIR-81) 81 MG EC tablet Take 81 mg by mouth daily.        . carvedilol (COREG) 25 MG tablet TAKE ONE TABLET TWICE DAILY  60 tablet  9  . digoxin (LANOXIN) 0.125 MG tablet TAKE ONE TABLET BY MOUTH ONE TIME DAILY  30 tablet  9  . furosemide (LASIX) 40 MG tablet TAKE ONE TABLET BY MOUTH DAILY  30 tablet  5  . hydrochlorothiazide (HYDRODIURIL) 50 MG tablet Take 1 tablet (50 mg total) by mouth daily.  30 tablet  3  . lisinopril (PRINIVIL,ZESTRIL) 40 MG tablet Take 1 tablet (40 mg total) by mouth daily.  30 tablet  11  . nitroGLYCERIN (NITROSTAT) 0.4 MG SL tablet Place 0.4 mg under the tongue as directed.        . simvastatin (ZOCOR) 40 MG tablet TAKE 1 TABLET (40 MG TOTAL) BY MOUTH AT BEDTIME.  90 tablet  2   No facility-administered encounter medications on file as of 10/25/2012.    No Known Allergies  Past Medical History  Diagnosis Date  . Other and unspecified hyperlipidemia   . Other specified forms of chronic ischemic heart disease     w sever residual LV dysfunc.,  LVEF less than 30%  . Acute myocardial infarction of other anterior wall, initial episode of care   . Coronary atherosclerosis of native coronary artery     status post anterior MI 2008 w V. fib arrest  . Ventricular fibrillation     implantable cardiac defib ?    ROS: Negative except as per HPI  BP 118/74  Pulse 85  Ht 5\' 11"  (1.803 m)  Wt 79.833 kg (176 lb)  BMI 24.56 kg/m2  SpO2 99%  PHYSICAL EXAM: Pt is alert and oriented, NAD HEENT: normal Neck: JVP - normal, carotids 2+= without bruits Lungs: CTA bilaterally CV: RRR without murmur or gallop Abd: soft, NT, Positive BS, no hepatomegaly Ext: no C/C/E, distal pulses intact and equal Skin: warm/dry no rash  2-D echo: Left ventricle: The cavity size was normal. Wall thickness was increased in a pattern of mild LVH. Systolic function was severely reduced. The estimated ejection fraction was in the range of 25% to 30%. Regional wall motion abnormalities: There is severe hypokinesis of the entire apical myocardium and distal 1/3 to 1/2 of the LV. Doppler parameters are consistent with abnormal left ventricular relaxation (grade 1 diastolic dysfunction).  ------------------------------------------------------------ Aortic valve: Structurally normal valve. Cusp separation was normal. Doppler: Transvalvular velocity was within the normal range. There was no stenosis. Trivial regurgitation.  ------------------------------------------------------------ Aorta: Aortic root: The aortic root was normal in size. Ascending aorta: The ascending aorta was  normal in size.  ------------------------------------------------------------ Mitral valve: Structurally normal valve. Leaflet separation was normal. Doppler: Transvalvular velocity was within the normal range. There was no evidence for stenosis. Trivial regurgitation.  ------------------------------------------------------------ Left atrium: The atrium was normal in  size.  ------------------------------------------------------------ Right ventricle: The cavity size was normal. Wall thickness was normal. Pacer wire or catheter noted in right ventricle. Systolic function was normal.  ------------------------------------------------------------ Pulmonic valve: The valve appears to be grossly normal. Doppler: No significant regurgitation.  ------------------------------------------------------------ Tricuspid valve: Structurally normal valve. Leaflet separation was normal. Doppler: Transvalvular velocity was within the normal range. No regurgitation.  ------------------------------------------------------------ Right atrium: The atrium was normal in size.  ------------------------------------------------------------ Pericardium: There was no pericardial effusion.  ------------------------------------------------------------ Systemic veins: Inferior vena cava: The vessel was normal in size; the respirophasic diameter changes were in the normal range (= 50%); findings are consistent with normal central venous pressure.  ASSESSMENT AND PLAN: 1. Coronary artery disease, native vessel. The patient is stable without anginal symptoms. He will continue on his current medical regimen.  2. Ischemic cardiomyopathy, left ventricular ejection fraction less than 30%. He has undergone ICD implantation. He sees Dr. Ladona Ridgel later this week and will have device interrogation at that point. He should remain on a combination of carvedilol, digoxin, and lisinopril. He does have a cough but he can tolerate this. He has failed ARB is in the past.  3. Hypertension. Blood pressure is much better controlled. He has responded very well hydrochlorothiazide. We'll check a metabolic panel today to make sure that potassium and creatinine are within limits.  For followup I would like to see him back in 6 months.  Tonny Bollman 10/25/2012 9:14 AM

## 2012-10-27 ENCOUNTER — Encounter: Payer: Self-pay | Admitting: Internal Medicine

## 2012-10-27 ENCOUNTER — Ambulatory Visit (INDEPENDENT_AMBULATORY_CARE_PROVIDER_SITE_OTHER): Payer: Medicare Other | Admitting: Internal Medicine

## 2012-10-27 VITALS — BP 155/78 | HR 82 | Ht 71.0 in | Wt 176.8 lb

## 2012-10-27 DIAGNOSIS — I2589 Other forms of chronic ischemic heart disease: Secondary | ICD-10-CM

## 2012-10-27 DIAGNOSIS — I4901 Ventricular fibrillation: Secondary | ICD-10-CM

## 2012-10-27 DIAGNOSIS — Z95810 Presence of automatic (implantable) cardiac defibrillator: Secondary | ICD-10-CM

## 2012-10-27 LAB — ICD DEVICE OBSERVATION
BATTERY VOLTAGE: 3.06 V
BRDY-0002RV: 40 {beats}/min
CHARGE TIME: 8.78 s
PACEART VT: 0
TZAT-0001FASTVT: 1
TZAT-0004FASTVT: 8
TZAT-0005FASTVT: 88 pct
TZAT-0011SLOWVT: 10 ms
TZAT-0011SLOWVT: 10 ms
TZAT-0019SLOWVT: 8 V
TZAT-0019SLOWVT: 8 V
TZAT-0020FASTVT: 1.6 ms
TZAT-0020SLOWVT: 1.6 ms
TZAT-0020SLOWVT: 1.6 ms
TZON-0008SLOWVT: 0 ms
TZST-0001FASTVT: 2
TZST-0001FASTVT: 4
TZST-0001SLOWVT: 5
TZST-0003FASTVT: 25 J
TZST-0003FASTVT: 35 J
TZST-0003FASTVT: 35 J
TZST-0003FASTVT: 35 J
TZST-0003SLOWVT: 15 J
TZST-0003SLOWVT: 35 J
VF: 0

## 2012-10-27 NOTE — Assessment & Plan Note (Signed)
He denies anginal symptoms. He'll continue his current medical therapy. I've encouraged the patient to increase his physical activity. 

## 2012-10-27 NOTE — Progress Notes (Signed)
HPI [name] returns today for followup. He is a very pleasant 67 year old man with an ischemic cardiomyopathy, chronic systolic heart failure, status post ventricular fibrillation arrest, status post ICD implantation. He also is a history of hypertension. The patient has been stable over the last several months. While his blood pressure is somewhat elevated today, he states that recently has been much better controlled. He denies syncope, chest pain, peripheral edema, or ICD shock. No Known Allergies   Current Outpatient Prescriptions  Medication Sig Dispense Refill  . aspirin (ASPIR-81) 81 MG EC tablet Take 81 mg by mouth daily.        . carvedilol (COREG) 25 MG tablet TAKE ONE TABLET TWICE DAILY  60 tablet  9  . digoxin (LANOXIN) 0.125 MG tablet TAKE ONE TABLET BY MOUTH ONE TIME DAILY  30 tablet  9  . hydrochlorothiazide (HYDRODIURIL) 50 MG tablet Take 1 tablet (50 mg total) by mouth daily.  30 tablet  3  . lisinopril (PRINIVIL,ZESTRIL) 40 MG tablet Take 1 tablet (40 mg total) by mouth daily.  30 tablet  11  . nitroGLYCERIN (NITROSTAT) 0.4 MG SL tablet Place 0.4 mg under the tongue as directed.        . simvastatin (ZOCOR) 40 MG tablet TAKE 1 TABLET (40 MG TOTAL) BY MOUTH AT BEDTIME.  90 tablet  2  . furosemide (LASIX) 40 MG tablet Take 40 mg by mouth daily.        No current facility-administered medications for this visit.     Past Medical History  Diagnosis Date  . Other and unspecified hyperlipidemia   . Other specified forms of chronic ischemic heart disease     w sever residual LV dysfunc., LVEF less than 30%  . Acute myocardial infarction of other anterior wall, initial episode of care   . Coronary atherosclerosis of native coronary artery     status post anterior MI 2008 w V. fib arrest  . Ventricular fibrillation     implantable cardiac defib ?    ROS:   All systems reviewed and negative except as noted in the HPI.   Past Surgical History  Procedure Laterality  Date  . Icd medtronic implanted  07/06/08    Dr. Sharrell Ku  . Cardiac catheterization  09/12/2007     Family History  Problem Relation Age of Onset  . Heart attack Mother      History   Social History  . Marital Status: Married    Spouse Name: N/A    Number of Children: N/A  . Years of Education: N/A   Occupational History  . Not on file.   Social History Main Topics  . Smoking status: Former Games developer  . Smokeless tobacco: Not on file  . Alcohol Use: 3.5 oz/week    7 drink(s) per week     Comment: beer  . Drug Use: Not on file  . Sexually Active: Not on file   Other Topics Concern  . Not on file   Social History Narrative  . No narrative on file     BP 155/78  Pulse 82  Ht 5\' 11"  (1.803 m)  Wt 176 lb 12.8 oz (80.196 kg)  BMI 24.67 kg/m2  Physical Exam:  Well appearing 67 year old man,NAD HEENT: Unremarkable Neck:  No JVD, no thyromegally Lungs:  Clear with no wheezes, rales, or rhonchi. HEART:  Regular rate rhythm, no murmurs, no rubs, no clicks Abd:  soft, positive bowel sounds, no organomegally, no rebound, no guarding Ext:  2 plus pulses, no edema, no cyanosis, no clubbing Skin:  No rashes no nodules Neuro:  CN II through XII intact, motor grossly intact  DEVICE  Normal device function.  See PaceArt for details.   Assess/Plan:

## 2012-10-27 NOTE — Patient Instructions (Addendum)
Remote monitoring is used to monitor your Pacemaker of ICD from home. This monitoring reduces the number of office visits required to check your device to one time per year. It allows [place] to keep an eye on the functioning of your device to ensure it is working properly. You are scheduled for a device check from home on 01/30/13. You may send your transmission at any time that day. If you have a wireless device, the transmission will be sent automatically. After your physician reviews your transmission, you will receive a postcard with your next transmission date.  Your physician wants you to follow-up in: 1 year with Dr. Ladona Ridgel. You will receive a reminder letter in the mail two months in advance. If you don't receive a letter, please call our office to schedule the follow-up appointment.  Your physician recommends that you continue on your current medications as directed. Please refer to the Current Medication list given to you today.

## 2012-10-27 NOTE — Assessment & Plan Note (Signed)
He has had no recurrent ventricular arrhythmias. He will continue his current medical therapy. 

## 2012-10-27 NOTE — Assessment & Plan Note (Signed)
His Medtronic ICD is working normally. Plan to recheck in several months. 

## 2012-11-18 ENCOUNTER — Telehealth: Payer: Self-pay | Admitting: Cardiovascular Disease

## 2012-11-18 NOTE — Telephone Encounter (Signed)
I spoke with the pt's wife and she said the chiropractor saw the pt this morning and said the pt needs an MRI to evaluate lower limb numbness.  She called our office because the chiropractor said that Medicare would not pay for MRI if ordered by Chiropractor. She would like to know if Dr Excell Seltzer will order MRI.  I made her aware that the pt cannot have an MRI due to ICD.  She will touch base with the chiropractor to determine what other type of testing can be done.

## 2012-11-18 NOTE — Telephone Encounter (Signed)
New Prob     Pt is experiencing some numbness in lower limbs. Neurologist said pt needs to get an MRI done, but insurance will not pay for it. Would like to speak to nurse regarding this.

## 2012-11-24 ENCOUNTER — Other Ambulatory Visit: Payer: Self-pay | Admitting: Cardiovascular Disease

## 2012-12-19 ENCOUNTER — Other Ambulatory Visit: Payer: Self-pay | Admitting: Cardiovascular Disease

## 2013-01-06 ENCOUNTER — Ambulatory Visit: Payer: Medicare Other | Admitting: Family Medicine

## 2013-01-22 ENCOUNTER — Other Ambulatory Visit: Payer: Self-pay | Admitting: Cardiovascular Disease

## 2013-01-30 ENCOUNTER — Ambulatory Visit (INDEPENDENT_AMBULATORY_CARE_PROVIDER_SITE_OTHER): Payer: Medicare Other | Admitting: *Deleted

## 2013-01-30 DIAGNOSIS — Z95810 Presence of automatic (implantable) cardiac defibrillator: Secondary | ICD-10-CM

## 2013-01-30 DIAGNOSIS — I4901 Ventricular fibrillation: Secondary | ICD-10-CM

## 2013-02-08 LAB — REMOTE ICD DEVICE
RV LEAD IMPEDENCE ICD: 544 Ohm
TZAT-0001SLOWVT: 1
TZAT-0001SLOWVT: 2
TZAT-0004FASTVT: 8
TZAT-0005SLOWVT: 84 pct
TZAT-0005SLOWVT: 91 pct
TZAT-0011SLOWVT: 10 ms
TZAT-0011SLOWVT: 10 ms
TZAT-0012FASTVT: 200 ms
TZAT-0018SLOWVT: NEGATIVE
TZAT-0019SLOWVT: 8 V
TZAT-0020FASTVT: 1.6 ms
TZON-0003FASTVT: 240 ms
TZON-0011AFLUTTER: 70
TZST-0001FASTVT: 2
TZST-0001FASTVT: 4
TZST-0001FASTVT: 6
TZST-0001SLOWVT: 3
TZST-0001SLOWVT: 4
TZST-0003FASTVT: 25 J
TZST-0003FASTVT: 35 J
TZST-0003FASTVT: 35 J
TZST-0003SLOWVT: 15 J
TZST-0003SLOWVT: 35 J

## 2013-02-10 ENCOUNTER — Encounter: Payer: Self-pay | Admitting: *Deleted

## 2013-02-17 ENCOUNTER — Encounter: Payer: Self-pay | Admitting: Internal Medicine

## 2013-02-20 ENCOUNTER — Telehealth: Payer: Self-pay | Admitting: Cardiovascular Disease

## 2013-02-20 NOTE — Telephone Encounter (Signed)
New Prob  Pt wife would like to speak with you regarding his health. She would not disclose any other information.

## 2013-02-20 NOTE — Telephone Encounter (Signed)
Home number has been changed.  Left message on the spouse's cell phone to contact the office.

## 2013-03-02 NOTE — Telephone Encounter (Signed)
I spoke with the pt's wife and she wanted to make [place] aware that the pt might have neuropathy.  She said the pt is complaining of numbness and pain in his feet.  I made her aware that the pt would need to see PCP but at this time he does not have a PCP.  She would like to discuss these symptoms at the pt's upcoming appointment in September with Dr Excell Seltzer.

## 2013-04-20 ENCOUNTER — Ambulatory Visit (INDEPENDENT_AMBULATORY_CARE_PROVIDER_SITE_OTHER): Payer: Medicare Other | Admitting: Cardiovascular Disease

## 2013-04-20 ENCOUNTER — Encounter: Payer: Self-pay | Admitting: Cardiovascular Disease

## 2013-04-20 VITALS — BP 128/72 | HR 74 | Ht 71.0 in | Wt 174.0 lb

## 2013-04-20 DIAGNOSIS — I2510 Atherosclerotic heart disease of native coronary artery without angina pectoris: Secondary | ICD-10-CM

## 2013-04-20 DIAGNOSIS — I2589 Other forms of chronic ischemic heart disease: Secondary | ICD-10-CM

## 2013-04-20 DIAGNOSIS — I10 Essential (primary) hypertension: Secondary | ICD-10-CM

## 2013-04-20 DIAGNOSIS — E785 Hyperlipidemia, unspecified: Secondary | ICD-10-CM

## 2013-04-20 NOTE — Patient Instructions (Addendum)
Your physician wants you to follow-up in: 6 MONTHS with Dr Excell Seltzer.  You will receive a reminder letter in the mail two months in advance. If you don't receive a letter, please call our office to schedule the follow-up appointment.  Your physician recommends that you return for a FASTING LIPID, LIVER and BMP in 6 MONTHS (1 week prior to appointment)--nothing to eat or drink after midnight.  You have been referred to [facility] for evaluation of pain and numbness in feet.

## 2013-04-20 NOTE — Progress Notes (Signed)
HPI:  67 year old gentleman returning for followup evaluation. The patient is followed for coronary artery disease and ischemic cardiomyopathy. He initially presented in 2009 with an anterior MI complicated by cardiac arrest. He has ultimately undergone ICD placement for severe ischemic cardiomyopathy. Last lipids from December 2013 showed a cholesterol of 128, triglycerides 174, HDL 38, and LDL 55.  From a cardiac perspective he is doing well. He denies chest pain, chest pressure, shortness of breath, leg swelling, or palpitations. His only complaint is difficulty with his feet. He feels numbness in the left and the right foot, but the left is worse. Walking makes this worse. He has no calf pain with walking. He has no other complaints today.  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 He notes that his energy level is much better than it has been in the past.  Outpatient Encounter Prescriptions as of 04/20/2013  Medication Sig Dispense Refill  . aspirin (ASPIR-81) 81 MG EC tablet Take 81 mg by mouth daily.        . carvedilol (COREG) 25 MG tablet TAKE ONE TABLET TWICE DAILY  60 tablet  5  . digoxin (LANOXIN) 0.125 MG tablet TAKE ONE TABLET BY MOUTH ONE TIME DAILY  30 tablet  8  . furosemide (LASIX) 40 MG tablet Take 40 mg by mouth daily.       . hydrochlorothiazide (HYDRODIURIL) 50 MG tablet TAKE ONE TABLET BY MOUTH ONE TIME DAILY  30 tablet  5  . lisinopril (PRINIVIL,ZESTRIL) 40 MG tablet Take 1 tablet (40 mg total) by mouth daily.  30 tablet  11  . nitroGLYCERIN (NITROSTAT) 0.4 MG SL tablet Place 0.4 mg under the tongue as directed.        . simvastatin (ZOCOR) 40 MG tablet TAKE 1 TABLET (40 MG TOTAL) BY MOUTH AT BEDTIME.  90 tablet  2   No facility-administered encounter medications on file as of 04/20/2013.    No Known Allergies  Past Medical History  Diagnosis Date  . Other and unspecified hyperlipidemia   . Other specified forms of chronic ischemic heart disease     w sever  residual LV dysfunc., LVEF less than 30%  . Acute myocardial infarction of other anterior wall, initial episode of care   . Coronary atherosclerosis of native coronary artery     status post anterior MI 2008 w V. fib arrest  . Ventricular fibrillation     implantable cardiac defib ?    ROS: Negative except as per HPI  BP 128/72  Pulse 74  Ht 5\' 11"  (1.803 m)  Wt 174 lb (78.926 kg)  BMI 24.28 kg/m2  SpO2 95%  PHYSICAL EXAM: Pt is alert and oriented, NAD HEENT: normal Neck: JVP - normal, carotids 2+= without bruits Lungs: CTA bilaterally CV: RRR without murmur or gallop Abd: soft, NT, Positive BS, no hepatomegaly Ext: no C/C/E, distal pulses intact and equal Skin: warm/dry no rash  EKG:  Sinus rhythm 74 beats per minute, age-indeterminate anteroseptal infarct, ST and T wave abnormality consider lateral ischemia.  ASSESSMENT AND PLAN: 1. Coronary artery disease, native vessel. The patient is status post MI with severe ischemic cardiomyopathy. He appears stable without anginal symptoms. His medications were reviewed and they are appropriate. Will continue the same.  2. Chronic systolic heart failure secondary to ischemic cardiomyopathy. The patient is on goal doses of carvedilol and lisinopril. He is New [place] Heart Association class I at present.  3. Hyperlipidemia. Lipids and LFTs from last year  were reviewed. Will repeat these before his next exam.  4. Bilateral foot pain and numbness. His peripheral pulse exam is normal. He may have neuropathy or an orthopedic problem, but symptoms sound more like neuropathy. Will refer him to neurology for evaluation.  Tonny Bollman 04/20/2013 10:39 AM

## 2013-05-15 ENCOUNTER — Ambulatory Visit (INDEPENDENT_AMBULATORY_CARE_PROVIDER_SITE_OTHER): Payer: Medicare Other | Admitting: *Deleted

## 2013-05-15 ENCOUNTER — Encounter: Payer: Self-pay | Admitting: Internal Medicine

## 2013-05-15 DIAGNOSIS — Z95810 Presence of automatic (implantable) cardiac defibrillator: Secondary | ICD-10-CM

## 2013-05-15 DIAGNOSIS — I2589 Other forms of chronic ischemic heart disease: Secondary | ICD-10-CM

## 2013-05-15 DIAGNOSIS — I4901 Ventricular fibrillation: Secondary | ICD-10-CM

## 2013-05-22 ENCOUNTER — Encounter: Payer: Self-pay | Admitting: Neurology

## 2013-05-22 ENCOUNTER — Encounter (INDEPENDENT_AMBULATORY_CARE_PROVIDER_SITE_OTHER): Payer: Self-pay

## 2013-05-22 ENCOUNTER — Ambulatory Visit (INDEPENDENT_AMBULATORY_CARE_PROVIDER_SITE_OTHER): Payer: Medicare Other | Admitting: Neurology

## 2013-05-22 VITALS — BP 124/76 | HR 66 | Ht 71.0 in | Wt 171.5 lb

## 2013-05-22 DIAGNOSIS — D518 Other vitamin B12 deficiency anemias: Secondary | ICD-10-CM

## 2013-05-22 DIAGNOSIS — R6889 Other general symptoms and signs: Secondary | ICD-10-CM

## 2013-05-22 DIAGNOSIS — R209 Unspecified disturbances of skin sensation: Secondary | ICD-10-CM

## 2013-05-22 NOTE — Progress Notes (Signed)
Reason for visit: Numbness in the feet  [name] is a 67 y.o. male  History of present illness:  [name] is a 67 year old left-handed white male with a history of coronary artery disease. The patient has noted a one-year history of some gradual onset of numbness involving the feet. The patient initially noted problems with the left foot, but more recently, the right foot is now having similar symptoms. The patient feels numbness in the feet, and a tingly pins and needles feeling as well. The patient notes the symptoms more when he is off of his feet, oftentimes first thing in the morning. The patient indicates that the symptoms did not keep him awake at night. The patient feels better when he is up and active, and the symptoms are less noticeable at that time. The patient denies any neck or low back pain or pain down arms or the legs. The patient denies any weakness of the extremities. The patient has some issues with balance, but he indicates that this has been a problem since 2009 when he had a cardiac arrest. The patient has been on statin medication since that time. The patient denies any problems controlling the bowels or the bladder. The patient is sent to this office for an evaluation.  Past Medical History  Diagnosis Date  . Other and unspecified hyperlipidemia   . Other specified forms of chronic ischemic heart disease     w sever residual LV dysfunc., LVEF less than 30%  . Acute myocardial infarction of other anterior wall, initial episode of care   . Coronary atherosclerosis of native coronary artery     status post anterior MI 2008 w V. fib arrest  . Ventricular fibrillation     implantable cardiac defib ?  . Macular degeneration   . Dyslipidemia   . Hypertension     Past Surgical History  Procedure Laterality Date  . Icd medtronic implanted  07/06/08    Dr. Sharrell [name]  . Cardiac catheterization  09/12/2007  . Cataract extraction Bilateral     Family History   Problem Relation Age of Onset  . Heart attack Father   . Cancer - Colon Sister     Social history:  reports that he has quit smoking. He has never used smokeless tobacco. He reports that he drinks about 3.5 ounces of alcohol per week. He reports that he does not use illicit drugs.  Medications:  Current Outpatient Prescriptions on File Prior to Visit  Medication Sig Dispense Refill  . aspirin (ASPIR-81) 81 MG EC tablet Take 81 mg by mouth daily.        . carvedilol (COREG) 25 MG tablet TAKE ONE TABLET TWICE DAILY  60 tablet  5  . digoxin (LANOXIN) 0.125 MG tablet TAKE ONE TABLET BY MOUTH ONE TIME DAILY  30 tablet  8  . furosemide (LASIX) 40 MG tablet Take 40 mg by mouth daily.       . hydrochlorothiazide (HYDRODIURIL) 50 MG tablet TAKE ONE TABLET BY MOUTH ONE TIME DAILY  30 tablet  5  . lisinopril (PRINIVIL,ZESTRIL) 40 MG tablet Take 1 tablet (40 mg total) by mouth daily.  30 tablet  11  . nitroGLYCERIN (NITROSTAT) 0.4 MG SL tablet Place 0.4 mg under the tongue as directed.        . simvastatin (ZOCOR) 40 MG tablet TAKE 1 TABLET (40 MG TOTAL) BY MOUTH AT BEDTIME.  90 tablet  2   No current facility-administered medications on file  prior to visit.     No Known Allergies  ROS:  Out of a complete 14 system review of symptoms, the patient complains only of the following symptoms, and all other reviewed systems are negative.  Numbness, foot pain Gait disturbance  Blood pressure 124/76, pulse 66, height 5\' 11"  (1.803 m), weight 171 lb 8 oz (77.792 kg).  Physical Exam  General: The patient is alert and cooperative at the time of the examination.  Head: Pupils are equal, round, and reactive to light. Discs are flat bilaterally.  Neck: The neck is supple, no carotid bruits are noted.  Respiratory: The respiratory examination is clear.  Cardiovascular: The cardiovascular examination reveals a regular rate and rhythm, no obvious murmurs or rubs are noted.  Neuromuscular: The  patient is able to flex the low back to approximately 120. The patient has fair range movement of the cervical spine.  Skin: Extremities are without significant edema.  Neurologic Exam  Mental status: The patient is alert and oriented.  Cranial nerves: Facial symmetry is present. There is good sensation of the face to pinprick and soft touch bilaterally. The strength of the facial muscles and the muscles to head turning and shoulder shrug are normal bilaterally. Speech is well enunciated, no aphasia or dysarthria is noted. Extraocular movements are full. Visual fields are full.  Motor: The motor testing reveals 5 over 5 strength of all 4 extremities. Good symmetric motor tone is noted throughout. The patient is able to walk on heels and the toes.  Sensory: Sensory testing is intact to pinprick, soft touch, vibration sensation, and position sense on the upper extremities. With the lower extremities, there is a stocking pattern pinprick sensory deficit one half way up the leg below the knee, and the right leg, there is some distal foot sensory alteration. Vibration sensation is impaired in both feet, and position sense is impaired in the left foot, minimally on the right. No evidence of extinction is noted.  Coordination: Cerebellar testing reveals good finger-nose-finger and heel-to-shin bilaterally. The patient has an intention tremor with finger-nose-finger.  Gait and station: Gait is slightly wide-based, unsteady. Tandem gait is slightly unsteady.. Romberg is negative. No drift is seen.  Reflexes: Deep tendon reflexes are symmetric, but are depressed bilaterally. Toes are downgoing bilaterally.   Assessment/Plan:  1. Foot numbness, rule out peripheral neuropathy  The patient has discomfort and numbness in the feet that is worse with rest. The patient has some asymmetry of symptoms, worse on the left leg than the right. The patient will be set up for nerve conduction studies of both legs,  and one arm. EMG evaluation will be done on the left leg. The patient will have blood work today to look for various etiologies of peripheral neuropathy. The patient may desire to go on medications for the discomfort in the future.  Marlan Palau MD 05/22/2013 7:11 PM  [facility] [address] [place], [place] [address]  Phone [phone] Fax [phone]

## 2013-05-30 LAB — REMOTE ICD DEVICE
BATTERY VOLTAGE: 3.03 V
BRDY-0002RV: 40 {beats}/min
DEV-0020ICD: NEGATIVE
RV LEAD AMPLITUDE: 11 mv
TOT-0006: 20140701000000
TZAT-0001FASTVT: 1
TZAT-0001SLOWVT: 1
TZAT-0004SLOWVT: 6
TZAT-0005FASTVT: 88 pct
TZAT-0012SLOWVT: 200 ms
TZAT-0012SLOWVT: 200 ms
TZAT-0013FASTVT: 1
TZAT-0013SLOWVT: 2
TZAT-0013SLOWVT: 2
TZAT-0018FASTVT: NEGATIVE
TZAT-0019FASTVT: 8 V
TZAT-0019SLOWVT: 8 V
TZAT-0019SLOWVT: 8 V
TZAT-0020FASTVT: 1.6 ms
TZAT-0020SLOWVT: 1.6 ms
TZAT-0020SLOWVT: 1.6 ms
TZON-0003SLOWVT: 310 ms
TZON-0004SLOWVT: 32
TZON-0008FASTVT: 0 ms
TZON-0008SLOWVT: 0 ms
TZST-0001FASTVT: 2
TZST-0001FASTVT: 5
TZST-0001SLOWVT: 5
TZST-0001SLOWVT: 6
TZST-0003FASTVT: 25 J
TZST-0003FASTVT: 35 J
TZST-0003FASTVT: 35 J
TZST-0003FASTVT: 35 J
TZST-0003SLOWVT: 15 J
TZST-0003SLOWVT: 25 J
TZST-0003SLOWVT: 35 J
TZST-0003SLOWVT: 35 J

## 2013-06-01 ENCOUNTER — Encounter: Payer: Medicare Other | Admitting: Radiology

## 2013-06-01 ENCOUNTER — Encounter: Payer: Medicare Other | Admitting: Neurology

## 2013-06-02 ENCOUNTER — Other Ambulatory Visit: Payer: Self-pay | Admitting: *Deleted

## 2013-06-02 DIAGNOSIS — R209 Unspecified disturbances of skin sensation: Secondary | ICD-10-CM

## 2013-06-09 ENCOUNTER — Encounter: Payer: Self-pay | Admitting: *Deleted

## 2013-06-13 ENCOUNTER — Telehealth: Payer: Self-pay | Admitting: *Deleted

## 2013-06-13 NOTE — Telephone Encounter (Signed)
A couple of week ago Dr. Anne Hahn suggest the my husband have blood work for neuropathy. [name], my husband, wants to wait until he has the medicare supplement which is due December 1st and then we can move forward.    I believe my husband has alcoholism, and I know that he wont tell the truth. He has drank for 50 years. And I think you should know, please speak to him about this during his next visit.

## 2013-06-13 NOTE — Telephone Encounter (Signed)
A history of alcohol overuse is noted. This could be an issue with the peripheral neuropathy.

## 2013-07-05 ENCOUNTER — Other Ambulatory Visit: Payer: Medicare Other

## 2013-07-05 ENCOUNTER — Encounter: Payer: Medicare Other | Admitting: Radiology

## 2013-07-05 ENCOUNTER — Other Ambulatory Visit: Payer: Self-pay | Admitting: Cardiovascular Disease

## 2013-08-04 ENCOUNTER — Other Ambulatory Visit: Payer: Self-pay | Admitting: *Deleted

## 2013-08-04 MED ORDER — DIGOXIN 125 MCG PO TABS: ORAL_TABLET | ORAL | Status: DC

## 2013-08-07 ENCOUNTER — Encounter: Payer: Medicare Other | Admitting: Neurology

## 2013-08-10 ENCOUNTER — Ambulatory Visit (INDEPENDENT_AMBULATORY_CARE_PROVIDER_SITE_OTHER): Payer: Medicare Other | Admitting: Neurology

## 2013-08-10 ENCOUNTER — Encounter (INDEPENDENT_AMBULATORY_CARE_PROVIDER_SITE_OTHER): Payer: Self-pay | Admitting: Radiology

## 2013-08-10 DIAGNOSIS — Z0289 Encounter for other administrative examinations: Secondary | ICD-10-CM

## 2013-08-10 DIAGNOSIS — G63 Polyneuropathy in diseases classified elsewhere: Secondary | ICD-10-CM

## 2013-08-10 DIAGNOSIS — D518 Other vitamin B12 deficiency anemias: Secondary | ICD-10-CM

## 2013-08-10 NOTE — Progress Notes (Signed)
[name] MangesHarvey [name] is a 68 year old gentleman with a history of coronary artery disease, presenting with numbness in the feet, left greater than right, over the last one year. The patient is being evaluated for a possible peripheral neuropathy.  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 EMG evaluation of the left lower extremity shows findings consistent with the diagnosis of peripheral neuropathy.  The patient never has blood work done previously, this will be reordered today. The patient is treating the lancinating pains in the feet with Advil, which she claims is effective.  The patient will followup in 6 months, the patient will contact our office if he wants more aggressive treatment for his neuropathy pain.

## 2013-08-10 NOTE — Procedures (Signed)
     HISTORY:  [name] is a 68 year old gentleman with a history of coronary artery disease. The patient had one year history of numbness in the feet, left greater than right. The patient being evaluated for a possible peripheral neuropathy.  NERVE CONDUCTION STUDIES:  Nerve conduction studies were performed on the right upper extremity. The distal motor latencies and motor amplitudes for the median and ulnar nerves were normal, with normal F wave latencies and nerve conduction velocities. The sensory latencies for the left median nerve was normal, prolonged for the left ulnar nerve.  Nerve conduction studies were performed on both lower extremities. The distal motor latencies for the peroneal and posterior tibial nerves were normal bilaterally, with normal motor amplitudes for these nerves bilaterally. Slowing was seen for the peroneal and posterior tibial nerves bilaterally. The F wave latencies were prolonged for the peroneal and posterior tibial nerves bilaterally. The peroneal sensory latencies were absent bilaterally.  EMG STUDIES:  EMG study was performed on the left lower extremity:  The tibialis anterior muscle reveals 2 to 4K motor units with decreased recruitment. No fibrillations or positive waves were seen. The peroneus tertius muscle reveals 2 to 4K motor units with decreased recruitment. No fibrillations or positive waves were seen. The medial gastrocnemius muscle reveals 1 to 3K motor units with full recruitment. No fibrillations or positive waves were seen. The vastus lateralis muscle reveals 2 to 4K motor units with full recruitment. No fibrillations or positive waves were seen. The iliopsoas muscle reveals 2 to 4K motor units with full recruitment. No fibrillations or positive waves were seen. The biceps femoris muscle (long head) reveals 2 to 4K motor units with full recruitment. No fibrillations or positive waves were seen. The lumbosacral paraspinal muscles were  tested at 3 levels, and revealed no abnormalities of insertional activity at all 3 levels tested. There was good relaxation.   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 EMG evaluation of the left lower extremity shows mild distal signs of denervation that are chronic, consistent with the diagnosis of peripheral neuropathy as above.  [name]. [name] Jaleen Finch MD 08/10/2013 1:45 PM  [facility] [address] [place], [place] [id number]  Phone [phone] Fax [phone]

## 2013-08-11 LAB — TSH: TSH: 1.15 u[IU]/mL (ref 0.450–4.500)

## 2013-08-14 ENCOUNTER — Telehealth: Payer: Self-pay | Admitting: Neurology

## 2013-08-14 ENCOUNTER — Encounter: Payer: Self-pay | Admitting: Neurology

## 2013-08-14 DIAGNOSIS — D518 Other vitamin B12 deficiency anemias: Secondary | ICD-10-CM

## 2013-08-14 HISTORY — DX: Other vitamin B12 deficiency anemias: D51.8

## 2013-08-14 LAB — IFE AND PE, SERUM
ALPHA 1: 0.2 g/dL (ref 0.1–0.4)
ALPHA2 GLOB SERPL ELPH-MCNC: 0.7 g/dL (ref 0.4–1.2)
Albumin SerPl Elph-Mcnc: 3.7 g/dL (ref 3.2–5.6)
Albumin/Glob SerPl: 1.3 (ref 0.7–2.0)
B-Globulin SerPl Elph-Mcnc: 1 g/dL (ref 0.6–1.3)
GAMMA GLOB SERPL ELPH-MCNC: 1.1 g/dL (ref 0.5–1.6)
GLOBULIN, TOTAL: 3 g/dL (ref 2.0–4.5)
IgA/Immunoglobulin A, Serum: 358 mg/dL (ref 91–414)
IgG (Immunoglobin G), Serum: 1078 mg/dL (ref 700–1600)
IgM (Immunoglobulin M), Srm: 66 mg/dL (ref 40–230)
Total Protein: 6.7 g/dL (ref 6.0–8.5)

## 2013-08-14 LAB — VITAMIN B12: Vitamin B-12: 176 pg/mL — ABNORMAL LOW (ref 211–946)

## 2013-08-14 LAB — RHEUMATOID FACTOR: Rhuematoid fact SerPl-aCnc: 8 IU/mL (ref 0.0–13.9)

## 2013-08-14 LAB — ANGIOTENSIN CONVERTING ENZYME: Angio Convert Enzyme: <14 U/L — ABNORMAL LOW (ref 14–82)

## 2013-08-14 LAB — ANA W/REFLEX: Anti Nuclear Antibody(ANA): NEGATIVE

## 2013-08-14 NOTE — Telephone Encounter (Signed)
I called patient. The patient has a low vitamin B12 level. I'll have him come in for a vitamin B12 shot, and then go on oral tablets at 1000 mcg daily. We will need to follow the blood levels of vitamin B12 in the future.

## 2013-08-15 ENCOUNTER — Ambulatory Visit (INDEPENDENT_AMBULATORY_CARE_PROVIDER_SITE_OTHER): Payer: Medicare Other | Admitting: Neurology

## 2013-08-15 DIAGNOSIS — D518 Other vitamin B12 deficiency anemias: Secondary | ICD-10-CM

## 2013-08-15 LAB — SPECIMEN STATUS REPORT

## 2013-08-15 MED ORDER — CYANOCOBALAMIN 1000 MCG/ML IJ SOLN
1000.0000 ug | Freq: Once | INTRAMUSCULAR | Status: AC
  Administered 2013-08-15: 1000 ug via INTRAMUSCULAR

## 2013-08-15 NOTE — Telephone Encounter (Signed)
I called and spoke to wife.  Let her know of the B12 injection (once), then will proceed with oral supplement.  F/u in 6 months per Dr. Anne HahnWillis note when pt in for EMG/NCS.

## 2013-08-15 NOTE — Progress Notes (Signed)
Patient here for B12 injection.  Under aseptic technique Cyanocobalamin 1000mcg/1ml given IM in left deltoid.  Tolerated well.  Band-Aid applied. 

## 2013-08-15 NOTE — Patient Instructions (Signed)
Patient will continue on oral B12 daily.

## 2013-08-18 ENCOUNTER — Ambulatory Visit (INDEPENDENT_AMBULATORY_CARE_PROVIDER_SITE_OTHER): Payer: Medicare Other | Admitting: *Deleted

## 2013-08-18 DIAGNOSIS — I4901 Ventricular fibrillation: Secondary | ICD-10-CM

## 2013-08-18 DIAGNOSIS — Z95810 Presence of automatic (implantable) cardiac defibrillator: Secondary | ICD-10-CM

## 2013-08-18 DIAGNOSIS — I2589 Other forms of chronic ischemic heart disease: Secondary | ICD-10-CM

## 2013-08-21 LAB — MDC_IDC_ENUM_SESS_TYPE_REMOTE
Battery Voltage: 3 V
Brady Statistic RV Percent Paced: 0 %
HIGH POWER IMPEDANCE MEASURED VALUE: 47 Ohm
Lead Channel Impedance Value: 504 Ohm
Lead Channel Sensing Intrinsic Amplitude: 11.3 mV
Lead Channel Setting Pacing Amplitude: 2.5 V
Lead Channel Setting Pacing Pulse Width: 0.4 ms
Lead Channel Setting Sensing Sensitivity: 0.6 mV
MDC IDC SESS DTM: 20150116181100
MDC IDC SET ZONE DETECTION INTERVAL: 240 ms
MDC IDC SET ZONE DETECTION INTERVAL: 310 ms
Zone Setting Detection Interval: 270 ms

## 2013-08-24 ENCOUNTER — Other Ambulatory Visit: Payer: Self-pay | Admitting: Cardiovascular Disease

## 2013-08-29 ENCOUNTER — Encounter: Payer: Self-pay | Admitting: *Deleted

## 2013-09-01 ENCOUNTER — Encounter: Payer: Self-pay | Admitting: Internal Medicine

## 2013-09-01 ENCOUNTER — Other Ambulatory Visit: Payer: Self-pay | Admitting: *Deleted

## 2013-09-01 MED ORDER — CARVEDILOL 25 MG PO TABS: ORAL_TABLET | ORAL | Status: DC

## 2013-09-27 ENCOUNTER — Telehealth: Payer: Self-pay | Admitting: Cardiovascular Disease

## 2013-09-27 NOTE — Telephone Encounter (Signed)
Left message for wife to return call to office, that office is closing at 3 pm today.

## 2013-09-27 NOTE — Telephone Encounter (Signed)
New message  Patient having weakness and feeling very tired. This is a patient that never misses work, wife is very concerned. She doesn't feel like this a emergency that she needs to call 911, but wants to know what she can do in the mean time. Please call and advise.

## 2013-09-27 NOTE — Telephone Encounter (Signed)
Spoke with patient's wife who states patient woke up during the night c/o unable to breath.  Wife states patient is recovering from a cold and she is uncertain as to whether this was heart related or r/t chest congestion.  Per wife patient denies pain in chest, neck, back, jaw, or any other area.  Wife states patient is not SOB currently but was concerned because the patient never calls in sick to work but did so today.  Wife reports patient has not been taking his fluid pills; states he has neuropathy in lower extremities but only complains of pain when he stands for long periods of time.  Wife is uncertain regarding lower extremity swelling; states she is at [employer] to buy patient some more Nyquil per his request.  I advised her that we recommend patient only takes Mucinex without decongestant for cold s/s.  I advised her that patient needs to resume taking his Lasix and HCTZ daily and that if patient experiences SOB any time prior to Monday, that it is our advice that she call 911.  I rescheduled patient's appointment in late March for this Monday, 3/2 and advised wife to call 911 if symptoms return or persist.  Patient's wife verbalized understanding and agreement.

## 2013-10-02 ENCOUNTER — Encounter: Payer: Self-pay | Admitting: Cardiovascular Disease

## 2013-10-02 ENCOUNTER — Ambulatory Visit (INDEPENDENT_AMBULATORY_CARE_PROVIDER_SITE_OTHER): Payer: Medicare Other | Admitting: Cardiovascular Disease

## 2013-10-02 VITALS — BP 126/62 | HR 83 | Ht 71.0 in | Wt 168.0 lb

## 2013-10-02 DIAGNOSIS — E785 Hyperlipidemia, unspecified: Secondary | ICD-10-CM

## 2013-10-02 DIAGNOSIS — I2510 Atherosclerotic heart disease of native coronary artery without angina pectoris: Secondary | ICD-10-CM

## 2013-10-02 DIAGNOSIS — I10 Essential (primary) hypertension: Secondary | ICD-10-CM

## 2013-10-02 DIAGNOSIS — R0602 Shortness of breath: Secondary | ICD-10-CM

## 2013-10-02 LAB — CBC WITH DIFFERENTIAL/PLATELET
Basophils Absolute: 0.1 10*3/uL (ref 0.0–0.1)
Basophils Relative: 0.5 % (ref 0.0–3.0)
EOS PCT: 0.5 % (ref 0.0–5.0)
Eosinophils Absolute: 0.1 10*3/uL (ref 0.0–0.7)
HEMATOCRIT: 50.9 % (ref 39.0–52.0)
HEMOGLOBIN: 16.8 g/dL (ref 13.0–17.0)
LYMPHS ABS: 2.3 10*3/uL (ref 0.7–4.0)
LYMPHS PCT: 23 % (ref 12.0–46.0)
MCHC: 33 g/dL (ref 30.0–36.0)
MCV: 94.3 fl (ref 78.0–100.0)
MONOS PCT: 7.9 % (ref 3.0–12.0)
Monocytes Absolute: 0.8 10*3/uL (ref 0.1–1.0)
NEUTROS ABS: 6.7 10*3/uL (ref 1.4–7.7)
Neutrophils Relative %: 68.1 % (ref 43.0–77.0)
Platelets: 338 10*3/uL (ref 150.0–400.0)
RBC: 5.39 Mil/uL (ref 4.22–5.81)
RDW: 13.1 % (ref 11.5–14.6)
WBC: 9.9 10*3/uL (ref 4.5–10.5)

## 2013-10-02 LAB — HEPATIC FUNCTION PANEL
ALK PHOS: 79 U/L (ref 39–117)
ALT: 20 U/L (ref 0–53)
AST: 18 U/L (ref 0–37)
Albumin: 3.8 g/dL (ref 3.5–5.2)
BILIRUBIN DIRECT: 0 mg/dL (ref 0.0–0.3)
BILIRUBIN TOTAL: 0.7 mg/dL (ref 0.3–1.2)
TOTAL PROTEIN: 7.7 g/dL (ref 6.0–8.3)

## 2013-10-02 LAB — BASIC METABOLIC PANEL
BUN: 20 mg/dL (ref 6–23)
CALCIUM: 9.6 mg/dL (ref 8.4–10.5)
CO2: 29 mEq/L (ref 19–32)
Chloride: 107 mEq/L (ref 96–112)
Creatinine, Ser: 1.5 mg/dL (ref 0.4–1.5)
GFR: 49.26 mL/min — ABNORMAL LOW (ref >60.00)
Glucose, Bld: 98 mg/dL (ref 70–99)
Potassium: 3.6 mEq/L (ref 3.5–5.1)
Sodium: 142 mEq/L (ref 135–145)

## 2013-10-02 LAB — TSH: TSH: 0.91 u[IU]/mL (ref 0.35–5.50)

## 2013-10-02 LAB — BRAIN NATRIURETIC PEPTIDE: PRO B NATRI PEPTIDE: 100 pg/mL (ref 0.0–100.0)

## 2013-10-02 NOTE — Patient Instructions (Addendum)
Your physician has requested that you have an echocardiogram. Echocardiography is a painless test that uses sound waves to create images of your heart. It provides your doctor with information about the size and shape of your heart and how well your heart's chambers and valves are working. This procedure takes approximately one hour. There are no restrictions for this procedure.  Your physician recommends that you schedule a follow-up appointment in: 4 weeks with Dr. Excell Seltzerooper after your echocardiogram  Your physician recommends that you have lab work today:  TSH, BNP, CBC, Liver panel, Basic Metabolic Panel  Your physician recommends that you return for lab work prior to your next office visit in 4 weeks with Dr. Excell Seltzerooper - cholesterol screening.  Do not eat or drink after midnight the night before this appointment.  Your physician recommends that you continue on your current medications as directed. Please refer to the Current Medication list given to you today.

## 2013-10-02 NOTE — Progress Notes (Signed)
HPI:  68 year old gentleman returning for followup evaluation. The patient is followed for coronary artery disease and ischemic cardiomyopathy. He initially presented in 2009 with an anterior MI complicated by cardiac arrest. He underwent primary PCI of the proximal LAD. He has ultimately undergone ICD placement for severe ischemic cardiomyopathy. Last lipids from December 2013 showed a cholesterol of 128, triglycerides 174, HDL 38, and LDL 55.  The patient has been having lot of problems over the past few weeks. He had an upper respiratory tract infection about 2 weeks ago, but this was fairly mild. Last week he developed acute shortness of breath one night. This was associated with cough. He's had no fevers or chills. He's not had leg swelling. He's had no further episodes of resting shortness of breath and he denies orthopnea or PND. He woke up from sleep and went to his recliner. After several minutes his breathing was better. He had held some of his cardiac medicines and is now taking them as scheduled. He's had no chest pain or pressure. He's had 2 episodes over the last few days where his legs "gave out." He did not lose consciousness with this. He did however fall to the ground. He had no vision changes associated with this. He's had no focal neurologic symptoms.  He was seen by Dr. Anne Hahn for evaluation of peripheral neuropathy. He was found to be deficient in vitamin B12 and has been supplementing this.   Outpatient Encounter Prescriptions as of 10/02/2013  Medication Sig  . aspirin (ASPIR-81) 81 MG EC tablet Take 81 mg by mouth daily.    . carvedilol (COREG) 25 MG tablet TAKE ONE TABLET BY MOUTH TWICE DAILY  . digoxin (LANOXIN) 0.125 MG tablet TAKE ONE TABLET BY MOUTH ONE TIME DAILY  . furosemide (LASIX) 40 MG tablet Take 40 mg by mouth daily.   . hydrochlorothiazide (HYDRODIURIL) 50 MG tablet TAKE ONE TABLET BY MOUTH ONE TIME DAILY  . lisinopril (PRINIVIL,ZESTRIL) 40 MG tablet TAKE 1  TABLET (40 MG TOTAL) BY MOUTH DAILY.  . nitroGLYCERIN (NITROSTAT) 0.4 MG SL tablet Place 0.4 mg under the tongue as directed.    . simvastatin (ZOCOR) 40 MG tablet TAKE ONE TABLET BY MOUTH AT BEDTIME   . vitamin B-12 (CYANOCOBALAMIN) 1000 MCG tablet Take 1,000 mcg by mouth daily.    No Known Allergies  Past Medical History  Diagnosis Date  . Other and unspecified hyperlipidemia   . Other specified forms of chronic ischemic heart disease     w sever residual LV dysfunc., LVEF less than 30%  . Acute myocardial infarction of other anterior wall, initial episode of care   . Coronary atherosclerosis of native coronary artery     status post anterior MI 2008 w V. fib arrest  . Ventricular fibrillation     implantable cardiac defib ?  . Macular degeneration   . Dyslipidemia   . Hypertension   . Other vitamin B12 deficiency anemia 08/14/2013    ROS: Negative except as per HPI  BP 126/62  Pulse 83  Ht 5\' 11"  (1.803 m)  Wt 168 lb (76.204 kg)  BMI 23.44 kg/m2  PHYSICAL EXAM: Pt is alert and oriented, NAD HEENT: normal Neck: JVP - normal, carotids 2+= without bruits Lungs: CTA bilaterally CV: RRR without murmur or gallop Abd: soft, NT, Positive BS, no hepatomegaly Ext: no C/C/E, distal pulses intact and equal Skin: warm/dry no rash  EKG: Normal sinus rhythm 83 beats per minute, left axis deviation, nonspecific IVCD,  age-indeterminate septal infarct, ST and T wave abnormality consider lateral ischemia.  ASSESSMENT AND PLAN: 1. Shortness of breath. Uncertain if this is related to congestive heart failure or recent respiratory illness. No obvious signs of volume overload on exam. I'm going to repeat a 2-D echocardiogram. Also concerned about his episodes of marked leg weakness. Will check a CBC, full metabolic panel, thyroid studies, and a BNP. We'll followup with him in 4 weeks. Also need to consider progressive coronary disease.  2. CAD, native vessel. No clear symptoms of angina.  However, he does have multiple nonspecific symptoms. For now we'll continue aspirin, beta blocker, and a statin drug. We'll followup with him in 4 weeks to determine whether further testing is indicated.  3. Ischemic cardiomyopathy. The patient is status post ICD implantation. He's on good medical program with an ACE inhibitor, beta blocker, and digoxin.  4. Hypertension. Blood pressure under optimal control of carvedilol, furosemide, hydrochlorothiazide, and lisinopril.  5. Hyperlipidemia. Lipids reviewed from 2013. He is scheduled for repeat lipids later this month. Cannot draw today because he is nonfasting.  For follow-up I will see him back in 4 weeks.  Tonny BollmanMichael Saren Corkern 10/02/2013 9:50 AM

## 2013-10-04 ENCOUNTER — Telehealth: Payer: Self-pay | Admitting: Cardiovascular Disease

## 2013-10-04 NOTE — Telephone Encounter (Signed)
New message  ° ° °Patient wife calling - test results  °

## 2013-10-04 NOTE — Telephone Encounter (Signed)
Lab results reviewed with patient's wife who verbalized understanding.  Patient is scheduled for fasting lipid/liver on 3/17 and an echocardiogram; wife verbalized understanding of importance to keep these appointments.

## 2013-10-13 ENCOUNTER — Encounter: Payer: Self-pay | Admitting: Internal Medicine

## 2013-10-13 ENCOUNTER — Ambulatory Visit (INDEPENDENT_AMBULATORY_CARE_PROVIDER_SITE_OTHER): Payer: Medicare Other | Admitting: Internal Medicine

## 2013-10-13 ENCOUNTER — Ambulatory Visit: Payer: Medicare Other | Admitting: Internal Medicine

## 2013-10-13 VITALS — BP 107/90 | HR 86 | Ht 71.0 in | Wt 169.0 lb

## 2013-10-13 DIAGNOSIS — Z95810 Presence of automatic (implantable) cardiac defibrillator: Secondary | ICD-10-CM

## 2013-10-13 DIAGNOSIS — I255 Ischemic cardiomyopathy: Secondary | ICD-10-CM

## 2013-10-13 DIAGNOSIS — I4901 Ventricular fibrillation: Secondary | ICD-10-CM

## 2013-10-13 DIAGNOSIS — I10 Essential (primary) hypertension: Secondary | ICD-10-CM

## 2013-10-13 DIAGNOSIS — I2589 Other forms of chronic ischemic heart disease: Secondary | ICD-10-CM

## 2013-10-13 DIAGNOSIS — I5022 Chronic systolic (congestive) heart failure: Secondary | ICD-10-CM

## 2013-10-13 LAB — MDC_IDC_ENUM_SESS_TYPE_INCLINIC
HIGH POWER IMPEDANCE MEASURED VALUE: 41 Ohm
HIGH POWER IMPEDANCE MEASURED VALUE: 45 Ohm
HIGH POWER IMPEDANCE MEASURED VALUE: 48 Ohm
HIGH POWER IMPEDANCE MEASURED VALUE: 49 Ohm
HIGH POWER IMPEDANCE MEASURED VALUE: 51 Ohm
HIGH POWER IMPEDANCE MEASURED VALUE: 52 Ohm
HIGH POWER IMPEDANCE MEASURED VALUE: 52 Ohm
HIGH POWER IMPEDANCE MEASURED VALUE: 53 Ohm
HIGH POWER IMPEDANCE MEASURED VALUE: 57 Ohm
HIGH POWER IMPEDANCE MEASURED VALUE: 63 Ohm
HIGH POWER IMPEDANCE MEASURED VALUE: 64 Ohm
HIGH POWER IMPEDANCE MEASURED VALUE: 65 Ohm
HighPow Impedance: 40 Ohm
HighPow Impedance: 45 Ohm
HighPow Impedance: 45 Ohm
HighPow Impedance: 46 Ohm
HighPow Impedance: 47 Ohm
HighPow Impedance: 49 Ohm
HighPow Impedance: 49 Ohm
HighPow Impedance: 49 Ohm
HighPow Impedance: 49 Ohm
HighPow Impedance: 50 Ohm
HighPow Impedance: 57 Ohm
HighPow Impedance: 58 Ohm
HighPow Impedance: 59 Ohm
HighPow Impedance: 61 Ohm
HighPow Impedance: 62 Ohm
HighPow Impedance: 62 Ohm
HighPow Impedance: 62 Ohm
HighPow Impedance: 63 Ohm
HighPow Impedance: 65 Ohm
Lead Channel Impedance Value: 448 Ohm
Lead Channel Impedance Value: 456 Ohm
Lead Channel Impedance Value: 464 Ohm
Lead Channel Impedance Value: 488 Ohm
Lead Channel Impedance Value: 488 Ohm
Lead Channel Impedance Value: 528 Ohm
Lead Channel Impedance Value: 552 Ohm
Lead Channel Impedance Value: 560 Ohm
Lead Channel Impedance Value: 568 Ohm
Lead Channel Pacing Threshold Pulse Width: 0.2 ms
Lead Channel Sensing Intrinsic Amplitude: 10.8 mV
Lead Channel Sensing Intrinsic Amplitude: 10.8 mV
Lead Channel Sensing Intrinsic Amplitude: 10.8 mV
Lead Channel Sensing Intrinsic Amplitude: 11.5 mV
Lead Channel Sensing Intrinsic Amplitude: 11.7 mV
Lead Channel Sensing Intrinsic Amplitude: 11.8 mV
Lead Channel Sensing Intrinsic Amplitude: 11.8 mV
Lead Channel Sensing Intrinsic Amplitude: 11.8 mV
Lead Channel Sensing Intrinsic Amplitude: 12 mV
Lead Channel Sensing Intrinsic Amplitude: 20.2 mV
Lead Channel Setting Pacing Amplitude: 2.5 V
Lead Channel Setting Sensing Sensitivity: 0.6 mV
MDC IDC MSMT BATTERY VOLTAGE: 3 V
MDC IDC MSMT LEADCHNL RV IMPEDANCE VALUE: 472 Ohm
MDC IDC MSMT LEADCHNL RV IMPEDANCE VALUE: 488 Ohm
MDC IDC MSMT LEADCHNL RV IMPEDANCE VALUE: 504 Ohm
MDC IDC MSMT LEADCHNL RV IMPEDANCE VALUE: 536 Ohm
MDC IDC MSMT LEADCHNL RV IMPEDANCE VALUE: 600 Ohm
MDC IDC MSMT LEADCHNL RV IMPEDANCE VALUE: 608 Ohm
MDC IDC MSMT LEADCHNL RV PACING THRESHOLD AMPLITUDE: 1 V
MDC IDC MSMT LEADCHNL RV SENSING INTR AMPL: 10.8 mV
MDC IDC MSMT LEADCHNL RV SENSING INTR AMPL: 11.8 mV
MDC IDC MSMT LEADCHNL RV SENSING INTR AMPL: 12 mV
MDC IDC MSMT LEADCHNL RV SENSING INTR AMPL: 12.5 mV
MDC IDC MSMT LEADCHNL RV SENSING INTR AMPL: 12.7 mV
MDC IDC SESS DTM: 20150313112237
MDC IDC SET LEADCHNL RV PACING PULSEWIDTH: 0.4 ms
MDC IDC SET ZONE DETECTION INTERVAL: 240 ms
MDC IDC SET ZONE DETECTION INTERVAL: 270 ms
MDC IDC STAT BRADY RV PERCENT PACED: 0 %
Zone Setting Detection Interval: 310 ms

## 2013-10-13 NOTE — Assessment & Plan Note (Signed)
He will followup with Dr. Excell Seltzerooper. He has occaisional anginal symptoms.

## 2013-10-13 NOTE — Progress Notes (Signed)
HPI [name] returns today for followup. He is a very pleasant 68 year old man with an ischemic cardiomyopathy, chronic systolic heart failure, status post ventricular fibrillation arrest, status post ICD implantation. He also is a history of hypertension. The patient has been stable over the last several months. While his blood pressure is somewhat elevated today, he states that recently has been much better controlled. He denies syncope, chest pain, peripheral edema, or ICD shock. No Known Allergies   Current Outpatient Prescriptions  Medication Sig Dispense Refill  . aspirin (ASPIR-81) 81 MG EC tablet Take 81 mg by mouth daily.        . carvedilol (COREG) 25 MG tablet TAKE ONE TABLET BY MOUTH TWICE DAILY  60 tablet  1  . digoxin (LANOXIN) 0.125 MG tablet TAKE ONE TABLET BY MOUTH ONE TIME DAILY  30 tablet  2  . furosemide (LASIX) 40 MG tablet Take 40 mg by mouth daily.       . hydrochlorothiazide (HYDRODIURIL) 50 MG tablet TAKE ONE TABLET BY MOUTH ONE TIME DAILY  30 tablet  5  . lisinopril (PRINIVIL,ZESTRIL) 40 MG tablet TAKE 1 TABLET (40 MG TOTAL) BY MOUTH DAILY.  30 tablet  6  . nitroGLYCERIN (NITROSTAT) 0.4 MG SL tablet Place 0.4 mg under the tongue as directed.        . simvastatin (ZOCOR) 40 MG tablet TAKE ONE TABLET BY MOUTH AT BEDTIME   30 tablet  6  . vitamin B-12 (CYANOCOBALAMIN) 1000 MCG tablet Take 1,000 mcg by mouth daily.       No current facility-administered medications for this visit.     Past Medical History  Diagnosis Date  . Other and unspecified hyperlipidemia   . Other specified forms of chronic ischemic heart disease     w sever residual LV dysfunc., LVEF less than 30%  . Acute myocardial infarction of other anterior wall, initial episode of care   . Coronary atherosclerosis of native coronary artery     status post anterior MI 2008 w V. fib arrest  . Ventricular fibrillation     implantable cardiac defib ?  . Macular degeneration   . Dyslipidemia   .  Hypertension   . Other vitamin B12 deficiency anemia 08/14/2013    ROS:   All systems reviewed and negative except as noted in the HPI.   Past Surgical History  Procedure Laterality Date  . Icd medtronic implanted  07/06/08    Dr. Sharrell Ku  . Cardiac catheterization  09/12/2007  . Cataract extraction Bilateral      Family History  Problem Relation Age of Onset  . Heart attack Father   . Cancer - Colon Sister      History   Social History  . Marital Status: Married    Spouse Name: N/A    Number of Children: 4  . Years of Education: HS   Occupational History  .     Social History Main Topics  . Smoking status: Former Games developer  . Smokeless tobacco: Never Used     Comment: QUIT 09/2007  . Alcohol Use: 3.5 oz/week    7 drink(s) per week     Comment: beer  . Drug Use: No  . Sexual Activity: Not on file   Other Topics Concern  . Not on file   Social History Narrative  . No narrative on file     BP 107/90  Pulse 86  Ht 5\' 11"  (1.803 m)  Wt 169 lb (76.658 kg)  BMI 23.58 kg/m2 130/82 by me Physical Exam:  Well appearing 68 year old man,NAD HEENT: Unremarkable Neck:  No JVD, no thyromegally Lungs:  Clear with no wheezes, rales, or rhonchi. HEART:  Regular rate rhythm, no murmurs, no rubs, no clicks Abd:  soft, positive bowel sounds, no organomegally, no rebound, no guarding Ext:  2 plus pulses, no edema, no cyanosis, no clubbing Skin:  No rashes no nodules Neuro:  CN II through XII intact, motor grossly intact  DEVICE  Normal device function.  See PaceArt for details.   Assess/Plan:

## 2013-10-13 NOTE — Assessment & Plan Note (Signed)
His Medtronic ICD is working normally. Will recheck in several months. 

## 2013-10-13 NOTE — Assessment & Plan Note (Signed)
His blood pressure is well controlled. Will follow. 

## 2013-10-13 NOTE — Patient Instructions (Signed)
Your physician recommends that you continue on your current medications as directed. Please refer to the Current Medication list given to you today.  Remote monitoring is used to monitor your Pacemaker of ICD from home. This monitoring reduces the number of office visits required to check your device to one time per year. It allows us to keep an eye on the functioning of your device to ensure it is working properly. You are scheduled for a device check from home on 01/15/14. You may send your transmission at any time that day. If you have a wireless device, the transmission will be sent automatically. After your physician reviews your transmission, you will receive a postcard with your next transmission date.  Your physician wants you to follow-up in: 1 year with Dr. Taylor.  You will receive a reminder letter in the mail two months in advance. If you don't receive a letter, please call our office to schedule the follow-up appointment.  

## 2013-10-17 ENCOUNTER — Ambulatory Visit (HOSPITAL_COMMUNITY): Payer: Medicare Other | Attending: Cardiology | Admitting: Radiology

## 2013-10-17 ENCOUNTER — Other Ambulatory Visit (INDEPENDENT_AMBULATORY_CARE_PROVIDER_SITE_OTHER): Payer: Medicare Other

## 2013-10-17 ENCOUNTER — Encounter: Payer: Self-pay | Admitting: Cardiovascular Disease

## 2013-10-17 DIAGNOSIS — I2589 Other forms of chronic ischemic heart disease: Secondary | ICD-10-CM

## 2013-10-17 DIAGNOSIS — I2510 Atherosclerotic heart disease of native coronary artery without angina pectoris: Secondary | ICD-10-CM

## 2013-10-17 DIAGNOSIS — I10 Essential (primary) hypertension: Secondary | ICD-10-CM

## 2013-10-17 DIAGNOSIS — R0602 Shortness of breath: Secondary | ICD-10-CM

## 2013-10-17 DIAGNOSIS — E785 Hyperlipidemia, unspecified: Secondary | ICD-10-CM

## 2013-10-17 LAB — LIPID PANEL
CHOLESTEROL: 151 mg/dL (ref 0–200)
HDL: 33.4 mg/dL — ABNORMAL LOW (ref >39.00)
LDL Cholesterol: 84 mg/dL (ref 0–99)
TRIGLYCERIDES: 166 mg/dL — AB (ref 0.0–149.0)
Total CHOL/HDL Ratio: 5
VLDL: 33.2 mg/dL (ref 0.0–40.0)

## 2013-10-17 LAB — HEPATIC FUNCTION PANEL
ALK PHOS: 66 U/L (ref 39–117)
ALT: 15 U/L (ref 0–53)
AST: 16 U/L (ref 0–37)
Albumin: 3.6 g/dL (ref 3.5–5.2)
BILIRUBIN DIRECT: 0.1 mg/dL (ref 0.0–0.3)
TOTAL PROTEIN: 7 g/dL (ref 6.0–8.3)
Total Bilirubin: 0.9 mg/dL (ref 0.3–1.2)

## 2013-10-17 LAB — BASIC METABOLIC PANEL
BUN: 20 mg/dL (ref 6–23)
CALCIUM: 9 mg/dL (ref 8.4–10.5)
CO2: 26 mEq/L (ref 19–32)
CREATININE: 1.6 mg/dL — AB (ref 0.4–1.5)
Chloride: 99 mEq/L (ref 96–112)
GFR: 46.07 mL/min — AB (ref >60.00)
Glucose, Bld: 114 mg/dL — ABNORMAL HIGH (ref 70–99)
Potassium: 3.7 mEq/L (ref 3.5–5.1)
Sodium: 136 mEq/L (ref 135–145)

## 2013-10-17 NOTE — Progress Notes (Signed)
Echocardiogram Performed. 

## 2013-10-19 ENCOUNTER — Telehealth: Payer: Self-pay | Admitting: Cardiovascular Disease

## 2013-10-19 NOTE — Telephone Encounter (Signed)
Reviewed echo results with patient's wife and reviewed preliminary lab results; wife verbalized understanding.  I advised wife that when Dr. Excell Seltzerooper reviews lab results that if there are any changes I will call her back.  Wife verbalized understanding and agreement.  Patient's wife advised that patient has a great deal of chest congestion and coughing up phlegm and has recently lost some weight.  She is concerned that patient has something going on with his lungs and asked me to talk with Dr. Excell Seltzerooper about further tests or referral that may need to be done.  I advised that I will forward message to Dr. Excell Seltzerooper and discuss with him in the office tomorrow and will call her back with his advice.

## 2013-10-19 NOTE — Telephone Encounter (Signed)
New message  ° ° °Patient wife calling for test results  °

## 2013-10-20 ENCOUNTER — Ambulatory Visit: Payer: Medicare Other | Admitting: Cardiovascular Disease

## 2013-10-20 ENCOUNTER — Other Ambulatory Visit: Payer: Medicare Other

## 2013-10-25 ENCOUNTER — Other Ambulatory Visit: Payer: Self-pay | Admitting: Cardiovascular Disease

## 2013-10-31 NOTE — Telephone Encounter (Signed)
Patient has follow up appointment with Dr. Excell Seltzerooper on 4/7

## 2013-11-07 ENCOUNTER — Ambulatory Visit (INDEPENDENT_AMBULATORY_CARE_PROVIDER_SITE_OTHER): Payer: Medicare Other | Admitting: Cardiovascular Disease

## 2013-11-07 ENCOUNTER — Encounter: Payer: Self-pay | Admitting: Cardiovascular Disease

## 2013-11-07 VITALS — BP 138/64 | HR 79 | Ht 71.0 in | Wt 179.0 lb

## 2013-11-07 DIAGNOSIS — I2510 Atherosclerotic heart disease of native coronary artery without angina pectoris: Secondary | ICD-10-CM

## 2013-11-07 NOTE — Patient Instructions (Signed)
Your physician wants you to follow-up in: 6 MONTHS with Dr Excell Seltzerooper.  You will receive a reminder letter in the mail two months in advance. If you don't receive a letter, please call our office to schedule the follow-up appointment.  Your physician recommends that you return for lab work in: 6 MONTHS (BMP)  Your physician recommends that you continue on your current medications as directed. Please refer to the Current Medication list given to you today.

## 2013-11-07 NOTE — Progress Notes (Signed)
HPI:  68 year old gentleman returning for followup evaluation. The patient is followed for coronary artery disease and ischemic cardiomyopathy. He initially presented in 2009 with an anterior MI complicated by cardiac arrest. He underwent primary PCI of the proximal LAD. He has ultimately undergone ICD placement for severe ischemic cardiomyopathy.  He was seen last month and had multiple complaints. He did not feel well and complained of progressive dyspnea and generalized fatigue. A battery of lab tests were done and there were no major abnormalities identified. An echocardiogram showed severe but stable LV dysfunction without valvular heart disease.  He has had a very good recovery from multitude of symptoms he was experiencing. No further shortness of breath, cough, or fatigue. He has been doing yardwork and feels ok with exertion. No edema, orthopnea, or PND. In retrospect, he thinks he likely had a viral illness with protracted recovery.   Outpatient Encounter Prescriptions as of 11/07/2013  Medication Sig  . aspirin (ASPIR-81) 81 MG EC tablet Take 81 mg by mouth daily.    . carvedilol (COREG) 25 MG tablet TAKE ONE TABLET BY MOUTH TWICE DAILY  . digoxin (LANOXIN) 0.125 MG tablet TAKE ONE TABLET BY MOUTH ONE TIME DAILY  . furosemide (LASIX) 40 MG tablet Take 40 mg by mouth daily.   . hydrochlorothiazide (HYDRODIURIL) 50 MG tablet TAKE ONE TABLET BY MOUTH ONE TIME DAILY  . lisinopril (PRINIVIL,ZESTRIL) 40 MG tablet TAKE 1 TABLET (40 MG TOTAL) BY MOUTH DAILY.  . nitroGLYCERIN (NITROSTAT) 0.4 MG SL tablet Place 0.4 mg under the tongue as directed.    . simvastatin (ZOCOR) 40 MG tablet TAKE ONE TABLET BY MOUTH AT BEDTIME   . vitamin B-12 (CYANOCOBALAMIN) 1000 MCG tablet Take 1,000 mcg by mouth daily.    No Known Allergies  Past Medical History  Diagnosis Date  . Other and unspecified hyperlipidemia   . Other specified forms of chronic ischemic heart disease     w sever residual LV  dysfunc., LVEF less than 30%  . Acute myocardial infarction of other anterior wall, initial episode of care   . Coronary atherosclerosis of native coronary artery     status post anterior MI 2008 w V. fib arrest  . Ventricular fibrillation     implantable cardiac defib ?  . Macular degeneration   . Dyslipidemia   . Hypertension   . Other vitamin B12 deficiency anemia 08/14/2013    ROS: Negative except as per HPI  BP 138/64  Pulse 79  Ht 5\' 11"  (1.803 m)  Wt 81.194 kg (179 lb)  BMI 24.98 kg/m2  PHYSICAL EXAM: Pt is alert and oriented, NAD HEENT: normal Neck: JVP - normal, carotids 2+= without bruits Lungs: CTA bilaterally CV: RRR without murmur or gallop Abd: soft, NT, Positive BS, no hepatomegaly Ext: no C/C/E, distal pulses intact and equal Skin: warm/dry no rash   2-D echocardiogram 10/17/2013: Study Conclusions  - Left ventricle: Mid and distal anterior wall septal apical and inferoapical akinesis The cavity size was severely dilated. Wall thickness was increased in a pattern of mild LVH. The estimated ejection fraction was 30%. - Aortic valve: Trivial regurgitation. - Mitral valve: Mild regurgitation. - Left atrium: The atrium was mildly dilated. - Atrial septum: No defect or patent foramen ovale was identified.  ASSESSMENT AND PLAN: 1. Chronic systolic CHF, NYHA Class 2. Stable/improved symptoms. Medications reviewed and will continue same Rx.  2. CAD, native vessel with Old MI. No symptoms of angina. Continue ASA, statin drug.  3.  HTN - currently well-controlled.  Other problems stable - see note from last month.  Tonny Bollman 11/08/2013 9:46 PM

## 2013-11-26 ENCOUNTER — Other Ambulatory Visit: Payer: Self-pay | Admitting: Cardiovascular Disease

## 2013-12-20 ENCOUNTER — Telehealth: Payer: Self-pay | Admitting: Neurology

## 2013-12-20 MED ORDER — GABAPENTIN 100 MG PO CAPS: ORAL_CAPSULE | ORAL | Status: DC

## 2013-12-20 NOTE — Telephone Encounter (Signed)
Patient's wife calling because patient is having pain in his feet and lower legs--difficult to walk--can medication be called in--generic if possible[facility] [address] [facility] [address]--has appt 7-13--please call and advise--thank you.

## 2013-12-20 NOTE — Telephone Encounter (Signed)
I called patient. The patient is now having some increased discomfort in the feet associated with his peripheral neuropathy. I will add low-dose gabapentin.

## 2013-12-20 NOTE — Telephone Encounter (Signed)
Pt's spouse calling stating that pt is having a lot of pain in feet and legs and difficult to walk. Would like to know if medication can be can into the pharmacy, wife stated that Dr. Anne HahnWillis said to call back to request medication for pt's Neuropathy, wife stated generic please low income. Please advise

## 2014-01-15 ENCOUNTER — Ambulatory Visit (INDEPENDENT_AMBULATORY_CARE_PROVIDER_SITE_OTHER): Payer: Medicare Other | Admitting: *Deleted

## 2014-01-15 DIAGNOSIS — I2589 Other forms of chronic ischemic heart disease: Secondary | ICD-10-CM

## 2014-01-15 LAB — MDC_IDC_ENUM_SESS_TYPE_REMOTE
Battery Voltage: 2.93 V
Brady Statistic RV Percent Paced: 0 %
HighPow Impedance: 48 Ohm
Lead Channel Impedance Value: 512 Ohm
Lead Channel Sensing Intrinsic Amplitude: 11.5 mV
Lead Channel Setting Pacing Amplitude: 2.5 V
Lead Channel Setting Pacing Pulse Width: 0.4 ms
Lead Channel Setting Sensing Sensitivity: 0.6 mV
MDC IDC SET ZONE DETECTION INTERVAL: 240 ms
MDC IDC SET ZONE DETECTION INTERVAL: 270 ms
Zone Setting Detection Interval: 310 ms

## 2014-01-16 NOTE — Progress Notes (Signed)
Remote ICD transmission.   

## 2014-01-21 ENCOUNTER — Other Ambulatory Visit: Payer: Self-pay | Admitting: Cardiovascular Disease

## 2014-01-23 ENCOUNTER — Encounter: Payer: Self-pay | Admitting: Cardiology

## 2014-01-25 ENCOUNTER — Encounter: Payer: Self-pay | Admitting: Internal Medicine

## 2014-02-12 ENCOUNTER — Encounter (INDEPENDENT_AMBULATORY_CARE_PROVIDER_SITE_OTHER): Payer: Self-pay

## 2014-02-12 ENCOUNTER — Ambulatory Visit (INDEPENDENT_AMBULATORY_CARE_PROVIDER_SITE_OTHER): Payer: Medicare Other | Admitting: Neurology

## 2014-02-12 ENCOUNTER — Encounter: Payer: Self-pay | Admitting: Neurology

## 2014-02-12 VITALS — BP 147/76 | HR 79 | Wt 186.0 lb

## 2014-02-12 DIAGNOSIS — G63 Polyneuropathy in diseases classified elsewhere: Secondary | ICD-10-CM

## 2014-02-12 DIAGNOSIS — D518 Other vitamin B12 deficiency anemias: Secondary | ICD-10-CM

## 2014-02-12 HISTORY — DX: Polyneuropathy in diseases classified elsewhere: G63

## 2014-02-12 NOTE — Patient Instructions (Signed)

## 2014-02-12 NOTE — Progress Notes (Signed)
Reason for visit: Peripheral neuropathy  [name] is an 68 y.o. male  History of present illness:  [name] is a 68 year old left-handed white male with a history of a peripheral neuropathy. The patient was found to have a vitamin B12 deficiency, and he currently is on oral supplementation taking 1000 mcg of vitamin B12 daily. He still has a lot of discomfort in the feet in the evening hours, and first thing in the morning when he gets up. The feet remain numb. He reports some mild gait instability at times, but he denies any falls. He is on low-dose gabapentin taking 100 mg twice during the day, and 2 at night. He generally sleeps fairly well at night. The gabapentin does help the pain from the neuropathy. He returns to this office for an evaluation.  Past Medical History  Diagnosis Date  . Other and unspecified hyperlipidemia   . Other specified forms of chronic ischemic heart disease     w sever residual LV dysfunc., LVEF less than 30%  . Acute myocardial infarction of other anterior wall, initial episode of care   . Coronary atherosclerosis of native coronary artery     status post anterior MI 2008 w V. fib arrest  . Ventricular fibrillation     implantable cardiac defib ?  . Macular degeneration   . Dyslipidemia   . Hypertension   . Other vitamin B12 deficiency anemia 08/14/2013  . Polyneuropathy in other diseases classified elsewhere 02/12/2014    Past Surgical History  Procedure Laterality Date  . Icd medtronic implanted  07/06/08    Dr. Sharrell KuGreg Taylor  . Cardiac catheterization  09/12/2007  . Cataract extraction Bilateral     Family History  Problem Relation Age of Onset  . Heart attack Father   . Cancer - Colon Sister     Social history:  reports that he has quit smoking. He has never used smokeless tobacco. He reports that he drinks about 3.5 ounces of alcohol per week. He reports that he does not use illicit drugs.   No Known Allergies  Medications:    Current Outpatient Prescriptions on File Prior to Visit  Medication Sig Dispense Refill  . aspirin (ASPIR-81) 81 MG EC tablet Take 81 mg by mouth daily.        . carvedilol (COREG) 25 MG tablet TAKE 1 TABLET BY MOUTH TWICE A DAY  60 tablet  5  . digoxin (LANOXIN) 0.125 MG tablet TAKE ONE TABLET BY MOUTH ONE TIME DAILY  30 tablet  9  . furosemide (LASIX) 40 MG tablet Take 40 mg by mouth daily.       Marland Kitchen. gabapentin (NEURONTIN) 100 MG capsule 1 capsule in the morning and at noon, 2 capsules in the evening  120 capsule  3  . hydrochlorothiazide (HYDRODIURIL) 50 MG tablet TAKE ONE TABLET BY MOUTH ONE TIME DAILY  30 tablet  5  . lisinopril (PRINIVIL,ZESTRIL) 40 MG tablet TAKE 1 TABLET BY MOUTH EVERY DAY  30 tablet  5  . nitroGLYCERIN (NITROSTAT) 0.4 MG SL tablet Place 0.4 mg under the tongue as directed.        . simvastatin (ZOCOR) 40 MG tablet TAKE 1 TABLET BY MOUTH AT BEDTIME  30 tablet  5  . vitamin B-12 (CYANOCOBALAMIN) 1000 MCG tablet Take 1,000 mcg by mouth daily.       No current facility-administered medications on file prior to visit.    ROS:  Out of a complete 14 system  review of symptoms, the patient complains only of the following symptoms, and all other reviewed systems are negative.  Numbness Gait instability  Blood pressure 147/76, pulse 79, weight 186 lb (84.369 kg).  Physical Exam  General: The patient is alert and cooperative at the time of the examination.  Skin: No significant peripheral edema is noted.   Neurologic Exam  Mental status: The patient is oriented x 3.  Cranial nerves: Facial symmetry is present. Speech is normal, no aphasia or dysarthria is noted. Extraocular movements are full. Visual fields are full.  Motor: The patient has good strength in all 4 extremities.  Sensory examination: Soft touch sensation is symmetric on the face, arms, and legs.  Coordination: The patient has good finger-nose-finger and heel-to-shin bilaterally.  Gait and  station: The patient has a normal gait. Tandem gait is minimally unsteady. Romberg is negative. No drift is seen.  Reflexes: Deep tendon reflexes are symmetric.   Assessment/Plan:  One. Peripheral neuropathy  2. Vitamin B12 deficiency  The patient will have a vitamin B12 level checked again. The patient will remain on gabapentin taking a total 400 mg daily. If desired, the dose could be increased, the patient will contact our office if this is the case. He will otherwise followup in about 6 months.  Marlan Palau MD 02/12/2014 7:34 PM  [facility] [address] [place], [place] [address]  Phone [phone] Fax [phone]

## 2014-02-13 LAB — VITAMIN B12: VITAMIN B 12: 1708 pg/mL — AB (ref 211–946)

## 2014-02-13 NOTE — Progress Notes (Signed)
Quick Note:  Called and shared labs thru VM message ______

## 2014-04-14 ENCOUNTER — Other Ambulatory Visit: Payer: Self-pay | Admitting: Neurology

## 2014-04-18 ENCOUNTER — Ambulatory Visit (INDEPENDENT_AMBULATORY_CARE_PROVIDER_SITE_OTHER): Payer: Medicare Other | Admitting: *Deleted

## 2014-04-18 ENCOUNTER — Telehealth: Payer: Self-pay | Admitting: Neurology

## 2014-04-18 DIAGNOSIS — I2589 Other forms of chronic ischemic heart disease: Secondary | ICD-10-CM

## 2014-04-18 DIAGNOSIS — I4901 Ventricular fibrillation: Secondary | ICD-10-CM

## 2014-04-18 LAB — MDC_IDC_ENUM_SESS_TYPE_REMOTE
Brady Statistic RV Percent Paced: 0 %
Date Time Interrogation Session: 20150916133200
HighPow Impedance: 47 Ohm
Lead Channel Setting Sensing Sensitivity: 0.6 mV
MDC IDC MSMT BATTERY VOLTAGE: 2.95 V
MDC IDC MSMT LEADCHNL RV IMPEDANCE VALUE: 568 Ohm
MDC IDC MSMT LEADCHNL RV SENSING INTR AMPL: 10.6 mV
MDC IDC SET LEADCHNL RV PACING AMPLITUDE: 2.5 V
MDC IDC SET LEADCHNL RV PACING PULSEWIDTH: 0.4 ms
MDC IDC SET ZONE DETECTION INTERVAL: 270 ms
Zone Setting Detection Interval: 240 ms
Zone Setting Detection Interval: 310 ms

## 2014-04-18 MED ORDER — GABAPENTIN 100 MG PO CAPS: 200.0000 mg | ORAL_CAPSULE | Freq: Three times a day (TID) | ORAL | Status: DC

## 2014-04-18 NOTE — Telephone Encounter (Signed)
Patient is still in pain and wants to know if Gabapentin could be increased. Please advise.

## 2014-04-18 NOTE — Progress Notes (Signed)
Remote ICD transmission.   

## 2014-04-18 NOTE — Telephone Encounter (Signed)
I called patient. His neuropathy pain has worsened slightly over time. We can go on the gabapentin taking 200 mg 3 times daily. If this is not effective after about 10 days, he will call me, we will go to 300 mg capsules 3 times daily.

## 2014-04-18 NOTE — Telephone Encounter (Signed)
Patient's wife calling to state that patient feels as though he needs to increase his dose of Gabapentin, please return call and advise.

## 2014-05-22 ENCOUNTER — Other Ambulatory Visit: Payer: Self-pay | Admitting: Cardiovascular Disease

## 2014-05-23 ENCOUNTER — Encounter: Payer: Self-pay | Admitting: Cardiovascular Disease

## 2014-05-23 ENCOUNTER — Ambulatory Visit (INDEPENDENT_AMBULATORY_CARE_PROVIDER_SITE_OTHER): Payer: Medicare Other | Admitting: Cardiovascular Disease

## 2014-05-23 VITALS — BP 148/72 | HR 62 | Ht 71.0 in | Wt 188.0 lb

## 2014-05-23 DIAGNOSIS — E785 Hyperlipidemia, unspecified: Secondary | ICD-10-CM

## 2014-05-23 DIAGNOSIS — I10 Essential (primary) hypertension: Secondary | ICD-10-CM

## 2014-05-23 NOTE — Patient Instructions (Signed)
Your physician recommends that you return for a FASTING LIPID, LIVER and BMP in 6 MONTHS--nothing to eat or drink after midnight, lab opens at 7:30 AM (1 week prior to Dr Excell Seltzerooper appointment)  Your physician wants you to follow-up in: 6 MONTHS with Dr Excell Seltzerooper.  You will receive a reminder letter in the mail two months in advance. If you don't receive a letter, please call our office to schedule the follow-up appointment.  Your physician recommends that you continue on your current medications as directed. Please refer to the Current Medication list given to you today.

## 2014-05-23 NOTE — Progress Notes (Signed)
    HPI:   68 year old gentleman returning for followup evaluation. The patient is followed for coronary artery disease and ischemic cardiomyopathy. He initially presented in 2009 with an anterior MI complicated by cardiac arrest. He underwent primary PCI of the proximal LAD. He has ultimately undergone ICD placement for severe ischemic cardiomyopathy.  Last lipids 10/17/2013: Lipid Panel     Component Value Date/Time   CHOL 151 10/17/2013 0739   TRIG 166.0* 10/17/2013 0739   HDL 33.40* 10/17/2013 0739   CHOLHDL 5 10/17/2013 0739   VLDL 33.2 10/17/2013 0739   LDLCALC 84 10/17/2013 0739   The patient is doing well. He denies chest pain, chest pressure, dyspnea, edema, or palpitations. He has struggled with neuropathy and activity has been more limited of late. No other medical issues/complaints since last office visit. No med changes.  Outpatient Encounter Prescriptions as of 05/23/2014  Medication Sig  . aspirin (ASPIR-81) 81 MG EC tablet Take 81 mg by mouth daily.    . carvedilol (COREG) 25 MG tablet TAKE 1 TABLET BY MOUTH TWICE A DAY  . digoxin (LANOXIN) 0.125 MG tablet TAKE ONE TABLET BY MOUTH ONE TIME DAILY  . furosemide (LASIX) 40 MG tablet Take 40 mg by mouth daily.   Marland Kitchen. gabapentin (NEURONTIN) 100 MG capsule Take 2 capsules (200 mg total) by mouth 3 (three) times daily.  . hydrochlorothiazide (HYDRODIURIL) 50 MG tablet TAKE ONE TABLET BY MOUTH ONE TIME DAILY  . lisinopril (PRINIVIL,ZESTRIL) 40 MG tablet TAKE 1 TABLET BY MOUTH EVERY DAY  . nitroGLYCERIN (NITROSTAT) 0.4 MG SL tablet Place 0.4 mg under the tongue as directed.    . simvastatin (ZOCOR) 40 MG tablet TAKE 1 TABLET BY MOUTH AT BEDTIME  . vitamin B-12 (CYANOCOBALAMIN) 1000 MCG tablet Take 1,000 mcg by mouth daily.    No Known Allergies  Past Medical History  Diagnosis Date  . Other and unspecified hyperlipidemia   . Other specified forms of chronic ischemic heart disease     w sever residual LV dysfunc., LVEF less than  30%  . Acute myocardial infarction of other anterior wall, initial episode of care   . Coronary atherosclerosis of native coronary artery     status post anterior MI 2008 w V. fib arrest  . Ventricular fibrillation     implantable cardiac defib ?  . Macular degeneration   . Dyslipidemia   . Hypertension   . Other vitamin B12 deficiency anemia 08/14/2013  . Polyneuropathy in other diseases classified elsewhere 02/12/2014    ROS: Negative except as per HPI  BP 148/72  Pulse 62  Ht 5\' 11"  (1.803 m)  Wt 188 lb (85.276 kg)  BMI 26.23 kg/m2  PHYSICAL EXAM: Pt is alert and oriented, NAD HEENT: normal Neck: JVP - normal, carotids 2+= without bruits Lungs: CTA bilaterally CV: RRR without murmur or gallop Abd: soft, NT, Positive BS, no hepatomegaly Ext: no C/C/E, distal pulses intact and equal Skin: warm/dry no rash  EKG:  NSR, age-indeterminate septal infarct, ST-T wave abnormality consider inferolateral ischemia.  ASSESSMENT AND PLAN: 1. Chronic systolic CHF, NYHA functional class II. Stable from CV perspective. No evidence of volume overload on exam. Continue current medical Rx.  2. CAD s/p remote MI, no anginal symptoms  3. HTN - controlled on current Rx. Continue HCTZ, lisinopril, carvedilol.  4. Hyperlipidemia - on statin drug. Repeat labs at time of 6 month follow-up.  Tonny BollmanMichael Galen Russman 05/23/2014 10:09 AM

## 2014-05-25 ENCOUNTER — Encounter: Payer: Self-pay | Admitting: *Deleted

## 2014-06-08 ENCOUNTER — Encounter: Payer: Self-pay | Admitting: Internal Medicine

## 2014-06-20 ENCOUNTER — Encounter: Payer: Self-pay | Admitting: Neurology

## 2014-06-26 ENCOUNTER — Encounter: Payer: Self-pay | Admitting: Neurology

## 2014-07-11 ENCOUNTER — Other Ambulatory Visit: Payer: Self-pay | Admitting: Cardiovascular Disease

## 2014-07-23 ENCOUNTER — Telehealth: Payer: Self-pay | Admitting: Cardiology

## 2014-07-23 ENCOUNTER — Encounter: Payer: Self-pay | Admitting: Internal Medicine

## 2014-07-23 ENCOUNTER — Ambulatory Visit (INDEPENDENT_AMBULATORY_CARE_PROVIDER_SITE_OTHER): Payer: Medicare Other | Admitting: *Deleted

## 2014-07-23 DIAGNOSIS — I255 Ischemic cardiomyopathy: Secondary | ICD-10-CM

## 2014-07-23 DIAGNOSIS — I4901 Ventricular fibrillation: Secondary | ICD-10-CM

## 2014-07-23 NOTE — Progress Notes (Signed)
Remote ICD transmission.   

## 2014-07-23 NOTE — Telephone Encounter (Signed)
Spoke with pt and reminded pt of remote transmission that is due today. Pt verbalized understanding.   

## 2014-07-24 LAB — MDC_IDC_ENUM_SESS_TYPE_REMOTE
Battery Voltage: 2.86 V
Date Time Interrogation Session: 20151221201100
HIGH POWER IMPEDANCE MEASURED VALUE: 47 Ohm
Lead Channel Impedance Value: 528 Ohm
Lead Channel Sensing Intrinsic Amplitude: 9.9 mV
Lead Channel Setting Pacing Amplitude: 2.5 V
Lead Channel Setting Pacing Pulse Width: 0.4 ms
MDC IDC SET LEADCHNL RV SENSING SENSITIVITY: 0.6 mV
MDC IDC SET ZONE DETECTION INTERVAL: 240 ms
MDC IDC STAT BRADY RV PERCENT PACED: 0 %
Zone Setting Detection Interval: 270 ms
Zone Setting Detection Interval: 310 ms

## 2014-07-31 ENCOUNTER — Encounter: Payer: Self-pay | Admitting: Cardiology

## 2014-08-06 ENCOUNTER — Telehealth: Payer: Self-pay | Admitting: *Deleted

## 2014-08-06 MED ORDER — GABAPENTIN 100 MG PO CAPS: 200.0000 mg | ORAL_CAPSULE | Freq: Three times a day (TID) | ORAL | Status: DC

## 2014-08-06 NOTE — Telephone Encounter (Signed)
I called the patient. I talk with the wife. The patient has increased the medication to 200 mg 3 times daily, ran out of the prescription that was written for a lower dose. I will call in a prescription with a corrected dosing.

## 2014-08-06 NOTE — Telephone Encounter (Signed)
Patient's wife went to pick up patient's Gabapentin, and was told by the pharmacy that medication could not be picked up till Wednesday, and patient ran out of medication this morning, would like to know if anything can be done so that patient does not run out of medication. Please advise

## 2014-08-08 ENCOUNTER — Telehealth: Payer: Self-pay | Admitting: Neurology

## 2014-08-08 MED ORDER — GABAPENTIN 100 MG PO CAPS: 200.0000 mg | ORAL_CAPSULE | Freq: Three times a day (TID) | ORAL | Status: DC

## 2014-08-08 NOTE — Telephone Encounter (Signed)
Rx has been resent.  I called back. Got no answer.

## 2014-08-08 NOTE — Telephone Encounter (Signed)
Patient's spouse, [name] stated Rx refill for gabapentin (NEURONTIN) 100 MG [facility] [address]  Please call and advise.

## 2014-08-16 ENCOUNTER — Ambulatory Visit: Payer: Medicare Other | Admitting: Adult Health

## 2014-08-27 ENCOUNTER — Ambulatory Visit: Payer: Medicare Other | Admitting: Adult Health

## 2014-08-29 ENCOUNTER — Encounter: Payer: Self-pay | Admitting: Adult Health

## 2014-08-29 ENCOUNTER — Ambulatory Visit (INDEPENDENT_AMBULATORY_CARE_PROVIDER_SITE_OTHER): Payer: Medicare Other | Admitting: Adult Health

## 2014-08-29 VITALS — BP 148/73 | HR 71 | Ht 71.0 in | Wt 170.0 lb

## 2014-08-29 DIAGNOSIS — D518 Other vitamin B12 deficiency anemias: Secondary | ICD-10-CM

## 2014-08-29 DIAGNOSIS — G63 Polyneuropathy in diseases classified elsewhere: Secondary | ICD-10-CM

## 2014-08-29 NOTE — Progress Notes (Signed)
PATIENT: [name] DOB: 1946-06-03  REASON FOR VISIT: follow up- peripheral neuropathy and vitamin B12 deficiency HISTORY FROM: patient  HISTORY OF PRESENT ILLNESS: [name] is a 69 year old male with a history of peripheral neuropathy and vitamin B-12 deficiency. He returns today for follow-up. He is currently taking gabapentin 200 mg 3 times a day. He reports that this has controlled his discomfort. The patient is an able to ambulate without assistance. He does feel unstable at times but has not had any falls. He states that when he first gets up in the mornings he will be slightly unsteady. He does have a cane but does not use all the time. No new medical history since last seen. Overall he is doing well.   HISTORY 02/12/14 (WILLIS): [name] is a 69 year old left-handed white male with a history of a peripheral neuropathy. The patient was found to have a vitamin B12 deficiency, and he currently is on oral supplementation taking 1000 mcg of vitamin B12 daily. He still has a lot of discomfort in the feet in the evening hours, and first thing in the morning when he gets up. The feet remain numb. He reports some mild gait instability at times, but he denies any falls. He is on low-dose gabapentin taking 100 mg twice during the day, and 2 at night. He generally sleeps fairly well at night. The gabapentin does help the pain from the neuropathy. He returns to this office for an evaluation.  REVIEW OF SYSTEMS: Out of a complete 14 system review of symptoms, the patient complains only of the following symptoms, and all other reviewed systems are negative.  Fatigue, cough, shortness of breath, cold intolerance, heat intolerance, snoring, muscle cramps, walking difficulty, testicular pain, numbness, weakness, tremors, bruise  ALLERGIES: No Known Allergies  HOME MEDICATIONS: Outpatient Prescriptions Prior to Visit  Medication Sig Dispense Refill  . aspirin (ASPIR-81) 81 MG EC tablet  Take 81 mg by mouth daily.      . carvedilol (COREG) 25 MG tablet TAKE 1 TABLET BY MOUTH TWICE A DAY 60 tablet 11  . digoxin (LANOXIN) 0.125 MG tablet TAKE ONE TABLET BY MOUTH ONE TIME DAILY 30 tablet 9  . furosemide (LASIX) 40 MG tablet Take 40 mg by mouth daily.     Marland Kitchen gabapentin (NEURONTIN) 100 MG capsule Take 2 capsules (200 mg total) by mouth 3 (three) times daily. 180 capsule 5  . hydrochlorothiazide (HYDRODIURIL) 50 MG tablet TAKE ONE TABLET BY MOUTH ONE TIME DAILY 30 tablet 5  . lisinopril (PRINIVIL,ZESTRIL) 40 MG tablet TAKE 1 TABLET BY MOUTH EVERY DAY 30 tablet 5  . nitroGLYCERIN (NITROSTAT) 0.4 MG SL tablet Place 0.4 mg under the tongue as directed.      . simvastatin (ZOCOR) 40 MG tablet TAKE 1 TABLET BY MOUTH AT BEDTIME 30 tablet 5  . vitamin B-12 (CYANOCOBALAMIN) 1000 MCG tablet Take 1,000 mcg by mouth daily.     No facility-administered medications prior to visit.    PAST MEDICAL HISTORY: Past Medical History  Diagnosis Date  . Other and unspecified hyperlipidemia   . Other specified forms of chronic ischemic heart disease     w sever residual LV dysfunc., LVEF less than 30%  . Acute myocardial infarction of other anterior wall, initial episode of care   . Coronary atherosclerosis of native coronary artery     status post anterior MI 2008 w V. fib arrest  . Ventricular fibrillation     implantable cardiac defib ?  Marland Kitchen  Macular degeneration   . Dyslipidemia   . Hypertension   . Other vitamin B12 deficiency anemia 08/14/2013  . Polyneuropathy in other diseases classified elsewhere 02/12/2014    PAST SURGICAL HISTORY: Past Surgical History  Procedure Laterality Date  . Icd medtronic implanted  07/06/08    Dr. Sharrell KuGreg Taylor  . Cardiac catheterization  09/12/2007  . Cataract extraction Bilateral     FAMILY HISTORY: Family History  Problem Relation Age of Onset  . Heart attack Father   . Cancer - Colon Sister     SOCIAL HISTORY: History   Social History  . Marital  Status: Married    Spouse Name: N/A    Number of Children: 4  . Years of Education: HS   Occupational History  . Retired    Social History Main Topics  . Smoking status: Former Games developermoker  . Smokeless tobacco: Never Used     Comment: QUIT 09/2007  . Alcohol Use: 3.5 oz/week    7 drink(s) per week     Comment: beer  . Drug Use: No  . Sexual Activity: Not on file   Other Topics Concern  . Not on file   Social History Narrative      PHYSICAL EXAM  Filed Vitals:   08/29/14 1005  BP: 148/73  Pulse: 71  Height: 5\' 11"  (1.803 m)  Weight: 170 lb (77.111 kg)   Body mass index is 23.72 kg/(m^2).  Generalized: Well developed, in no acute distress   Neurological examination  Mentation: Alert oriented to time, place, history taking. Follows all commands speech and language fluent Cranial nerve II-XII: Pupils were equal round reactive to light. Extraocular movements were full, visual field were full on confrontational test. Facial sensation and strength were normal. Uvula tongue midline. Head turning and shoulder shrug  were normal and symmetric. Motor: The motor testing reveals 5 over 5 strength of all 4 extremities. Good symmetric motor tone is noted throughout.  Sensory: Sensory testing is intact to soft touch on all 4 extremities. No evidence of extinction is noted.  Coordination: Cerebellar testing reveals good finger-nose-finger and heel-to-shin bilaterally.  Gait and station: Gait is normal. Tandem gait is normal. Romberg is negative. No drift is seen.  Reflexes: Deep tendon reflexes are symmetric and normal bilaterally.    DIAGNOSTIC DATA (LABS, IMAGING, TESTING) - I reviewed patient records, labs, notes, testing and imaging myself where available.  Lab Results  Component Value Date   WBC 9.9 10/02/2013   HGB 16.8 10/02/2013   HCT 50.9 10/02/2013   MCV 94.3 10/02/2013   PLT 338.0 10/02/2013      Component Value Date/Time   NA 136 10/17/2013 0739   K 3.7 10/17/2013  0739   CL 99 10/17/2013 0739   CO2 26 10/17/2013 0739   GLUCOSE 114* 10/17/2013 0739   BUN 20 10/17/2013 0739   CREATININE 1.6* 10/17/2013 0739   CALCIUM 9.0 10/17/2013 0739   PROT 7.0 10/17/2013 0739   PROT 6.7 08/10/2013 1351   ALBUMIN 3.6 10/17/2013 0739   AST 16 10/17/2013 0739   ALT 15 10/17/2013 0739   ALKPHOS 66 10/17/2013 0739   BILITOT 0.9 10/17/2013 0739   GFRNONAA 68.81 07/18/2010 0826   GFRAA 110 06/27/2008 0839   Lab Results  Component Value Date   CHOL 151 10/17/2013   HDL 33.40* 10/17/2013   LDLCALC 84 10/17/2013   TRIG 166.0* 10/17/2013   CHOLHDL 5 10/17/2013   Lab Results  Component Value Date   HGBA1C  09/12/2007    5.2 (NOTE)   The ADA recommends the following therapeutic goals for glycemic   control related to Hgb A1C measurement:   Goal of Therapy:   < 7.0% Hgb A1C   Action Suggested:  > 8.0% Hgb A1C   Ref:  Diabetes Care, 22, Suppl. 1, 1999   Lab Results  Component Value Date   VITAMINB12 1708* 02/12/2014   Lab Results  Component Value Date   TSH 0.91 10/02/2013      ASSESSMENT AND PLAN 69 y.o. year old male  has a past medical history of Other and unspecified hyperlipidemia; Other specified forms of chronic ischemic heart disease; Acute myocardial infarction of other anterior wall, initial episode of care; Coronary atherosclerosis of native coronary artery; Ventricular fibrillation; Macular degeneration; Dyslipidemia; Hypertension; Other vitamin B12 deficiency anemia (08/14/2013); and Polyneuropathy in other diseases classified elsewhere (02/12/2014). here with:  1. Peripheral neuropathy  2. Vitamin B12 deficiency  Overall the patient is doing well. He states that the gabapentin is controlling his discomfort. He will continue taking gabapentin 200 mg 3 times a day. No refill needed today. His vitamin B 12 level was checked at the last visit and was 1708. The patient has continued to take his vitamin B-12 supplement. We will consider rechecking this  at the next visit if needed. If the patient's symptoms worsen or he develops new symptoms he should let us know. Otherwise he will follow up in 6 months or sooner if needed.   Butch Penny, MSN, NP-C 08/29/2014, 10:15 AM [facility] [address] [place], [place] [address] [phone]  Note: This document was prepared with digital dictation and possible smart phrase technology. Any transcriptional errors that result from this process are unintentional.

## 2014-08-29 NOTE — Patient Instructions (Signed)
Continue Gabapentin 200 mg three times a day.  If your discomfort gets worse please let us know.  We will recheck your vitamin B12 level at the next visit.

## 2014-08-29 NOTE — Progress Notes (Signed)
I have read the note, and I agree with the clinical assessment and plan.  Dorna Mallet KEITH   

## 2014-09-08 ENCOUNTER — Other Ambulatory Visit: Payer: Self-pay | Admitting: Cardiovascular Disease

## 2014-10-16 ENCOUNTER — Encounter: Payer: Self-pay | Admitting: Internal Medicine

## 2014-10-16 ENCOUNTER — Ambulatory Visit (INDEPENDENT_AMBULATORY_CARE_PROVIDER_SITE_OTHER): Payer: Medicare Other | Admitting: Internal Medicine

## 2014-10-16 VITALS — BP 150/82 | HR 74 | Ht 71.0 in | Wt 184.8 lb

## 2014-10-16 DIAGNOSIS — I4901 Ventricular fibrillation: Secondary | ICD-10-CM

## 2014-10-16 LAB — MDC_IDC_ENUM_SESS_TYPE_INCLINIC
Battery Voltage: 2.83 V
Brady Statistic RV Percent Paced: 0 %
Date Time Interrogation Session: 20160315121410
HIGH POWER IMPEDANCE MEASURED VALUE: 47 Ohm
HIGH POWER IMPEDANCE MEASURED VALUE: 47 Ohm
HIGH POWER IMPEDANCE MEASURED VALUE: 47 Ohm
HIGH POWER IMPEDANCE MEASURED VALUE: 49 Ohm
HIGH POWER IMPEDANCE MEASURED VALUE: 50 Ohm
HIGH POWER IMPEDANCE MEASURED VALUE: 50 Ohm
HIGH POWER IMPEDANCE MEASURED VALUE: 51 Ohm
HIGH POWER IMPEDANCE MEASURED VALUE: 52 Ohm
HIGH POWER IMPEDANCE MEASURED VALUE: 60 Ohm
HIGH POWER IMPEDANCE MEASURED VALUE: 60 Ohm
HIGH POWER IMPEDANCE MEASURED VALUE: 62 Ohm
HIGH POWER IMPEDANCE MEASURED VALUE: 65 Ohm
HighPow Impedance: 47 Ohm
HighPow Impedance: 47 Ohm
HighPow Impedance: 48 Ohm
HighPow Impedance: 49 Ohm
HighPow Impedance: 49 Ohm
HighPow Impedance: 50 Ohm
HighPow Impedance: 52 Ohm
HighPow Impedance: 55 Ohm
HighPow Impedance: 59 Ohm
HighPow Impedance: 60 Ohm
HighPow Impedance: 60 Ohm
HighPow Impedance: 61 Ohm
HighPow Impedance: 61 Ohm
HighPow Impedance: 61 Ohm
HighPow Impedance: 62 Ohm
HighPow Impedance: 63 Ohm
HighPow Impedance: 64 Ohm
HighPow Impedance: 65 Ohm
HighPow Impedance: 65 Ohm
Lead Channel Impedance Value: 456 Ohm
Lead Channel Impedance Value: 464 Ohm
Lead Channel Impedance Value: 488 Ohm
Lead Channel Impedance Value: 488 Ohm
Lead Channel Impedance Value: 496 Ohm
Lead Channel Impedance Value: 536 Ohm
Lead Channel Impedance Value: 552 Ohm
Lead Channel Impedance Value: 552 Ohm
Lead Channel Impedance Value: 552 Ohm
Lead Channel Impedance Value: 568 Ohm
Lead Channel Impedance Value: 608 Ohm
Lead Channel Pacing Threshold Amplitude: 1 V
Lead Channel Pacing Threshold Pulse Width: 0.2 ms
Lead Channel Setting Sensing Sensitivity: 0.6 mV
MDC IDC MSMT LEADCHNL RV IMPEDANCE VALUE: 496 Ohm
MDC IDC MSMT LEADCHNL RV IMPEDANCE VALUE: 520 Ohm
MDC IDC MSMT LEADCHNL RV IMPEDANCE VALUE: 568 Ohm
MDC IDC MSMT LEADCHNL RV IMPEDANCE VALUE: 576 Ohm
MDC IDC MSMT LEADCHNL RV SENSING INTR AMPL: 10.5 mV
MDC IDC SET LEADCHNL RV PACING AMPLITUDE: 2.5 V
MDC IDC SET LEADCHNL RV PACING PULSEWIDTH: 0.4 ms
MDC IDC SET ZONE DETECTION INTERVAL: 240 ms
MDC IDC SET ZONE DETECTION INTERVAL: 270 ms
Zone Setting Detection Interval: 310 ms

## 2014-10-16 NOTE — Patient Instructions (Signed)
Your physician wants you to follow-up in: 12 months with Dr Court Joyaylor You will receive a reminder letter in the mail two months in advance. If you don't receive a letter, please call our office to schedule the follow-up appointment.   Remote monitoring is used to monitor your Pacemaker or ICD from home. This monitoring reduces the number of office visits required to check your device to one time per year. It allows us to keep an eye on the functioning of your device to ensure it is working properly. You are scheduled for a device check from home on 01/13/15. You may send your transmission at any time that day. If you have a wireless device, the transmission will be sent automatically. After your physician reviews your transmission, you will receive a postcard with your next transmission date.

## 2014-10-16 NOTE — Assessment & Plan Note (Signed)
Interogation of his Medtronic ICD demonstrates normal device function. Will recheck in several months.

## 2014-10-16 NOTE — Assessment & Plan Note (Signed)
He has had no recurrent ventricular arrhythmias. Will follow.  

## 2014-10-16 NOTE — Progress Notes (Signed)
HPI Mr. Michaelis returns today for followup. He is a very pleasant 69 year old man with an ischemic cardiomyopathy, chronic systolic heart failure, status post ventricular fibrillation arrest, status post ICD implantation. He also is a history of hypertension. The patient has been stable over the last several months. While his blood pressure is somewhat elevated today, he states that recently has been much better controlled. He denies syncope, chest pain, peripheral edema, or ICD shock. 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 No Known Allergies   Current Outpatient Prescriptions  Medication Sig Dispense Refill  . aspirin (ASPIR-81) 81 MG EC tablet Take 81 mg by mouth daily.      . carvedilol (COREG) 25 MG tablet TAKE 1 TABLET BY MOUTH TWICE A DAY 60 tablet 11  . digoxin (LANOXIN) 0.125 MG tablet TAKE ONE TABLET BY MOUTH ONE TIME DAILY 30 tablet 1  . furosemide (LASIX) 40 MG tablet Take 40 mg by mouth daily.     Marland Kitchen. gabapentin (NEURONTIN) 100 MG capsule Take 2 capsules (200 mg total) by mouth 3 (three) times daily. 180 capsule 5  . hydrochlorothiazide (HYDRODIURIL) 50 MG tablet TAKE ONE TABLET BY MOUTH ONE TIME DAILY 30 tablet 5  . lisinopril (PRINIVIL,ZESTRIL) 40 MG tablet TAKE 1 TABLET BY MOUTH EVERY DAY 30 tablet 5  . nitroGLYCERIN (NITROSTAT) 0.4 MG SL tablet Place 0.4 mg under the tongue every 5 (five) minutes as needed for chest pain (MAX 3 TABLETS).     Marland Kitchen. simvastatin (ZOCOR) 40 MG tablet TAKE 1 TABLET BY MOUTH AT BEDTIME 30 tablet 5  . vitamin B-12 (CYANOCOBALAMIN) 1000 MCG tablet Take 1,000 mcg by mouth daily.     No current facility-administered medications for this visit.     Past Medical History  Diagnosis Date  . Other and unspecified hyperlipidemia   . Other specified forms of chronic ischemic heart disease     w sever residual LV dysfunc., LVEF less than 30%  . Acute myocardial infarction of other anterior wall, initial episode of care   . Coronary atherosclerosis of native  coronary artery     status post anterior MI 2008 w V. fib arrest  . Ventricular fibrillation     implantable cardiac defib ?  . Macular degeneration   . Dyslipidemia   . Hypertension   . Other vitamin B12 deficiency anemia 08/14/2013  . Polyneuropathy in other diseases classified elsewhere 02/12/2014    ROS:   All systems reviewed and negative except as noted in the HPI.   Past Surgical History  Procedure Laterality Date  . Icd medtronic implanted  07/06/08    Dr. Sharrell KuGreg Dmarion Perfect  . Cardiac catheterization  09/12/2007  . Cataract extraction Bilateral      Family History  Problem Relation Age of Onset  . Heart attack Father   . Cancer - Colon Sister      History   Social History  . Marital Status: Married    Spouse Name: N/A  . Number of Children: 4  . Years of Education: HS   Occupational History  . Retired    Social History Main Topics  . Smoking status: Former Games developermoker  . Smokeless tobacco: Never Used     Comment: QUIT 09/2007  . Alcohol Use: 3.5 oz/week    7 drink(s) per week     Comment: beer  . Drug Use: No  . Sexual Activity: Not on file   Other Topics Concern  . Not on file   Social History Narrative  BP 150/82 mmHg  Pulse 74  Ht  (1.803 m)  Wt 184 lb 12.8 oz (83.825 kg)  BMI 25.79 kg/m2 130/82 by me Physical Exam:  Well appearing 69 year old man,NAD HEENT: Unremarkable Neck:  No JVD, no thyromegally Lungs:  Clear with no wheezes, rales, or rhonchi. HEART:  Regular rate rhythm, no murmurs, no rubs, no clicks Abd:  soft, positive bowel sounds, no organomegally, no rebound, no guarding Ext:  2 plus pulses, no edema, no cyanosis, no clubbing Skin:  No rashes no nodules Neuro:  CN II through XII intact, motor grossly intact  DEVICE  Normal device function.  See PaceArt for details.   Assess/Plan:

## 2014-10-16 NOTE — Assessment & Plan Note (Signed)
He denies anginal symptoms. He will continue his current meds.  

## 2014-11-02 ENCOUNTER — Telehealth: Payer: Self-pay | Admitting: Cardiovascular Disease

## 2014-11-02 NOTE — Telephone Encounter (Signed)
Pt c/o BP issue:  1. What are your last 5 BP readings? In the course of a day it can 115-180/50-80 or 85... She hasn't written them down..   2. Are you having any other symptoms (ex. Dizziness, headache, blurred vision, passed out)? No really. The patient can just tell when its high or low. Low-Dizzy and tired. High-headache and pressure 3. What is your medication issue? She is not sure if it is the medication. Would like a call back to discuss

## 2014-11-02 NOTE — Telephone Encounter (Signed)
Spoke with wife about pt's recent BPs.  She doesn't know if there has been a pattern to when the BP is high or low.  She doesn't know how the patient is taking his medications.  She reports she knows he takes them like he is supposed to if they are twice a day or once a day but doesn't know if he takes lisinopril in the AM or PM or when he takes any of his other medications.  She states he just mentioned it to her yesterday.  She states he is not doing anything differently but he can tell when he BP is low or high based on how he feels.  Advised for pt to start a BP dairy and to keep a record of how is he is feeling when he takes it.  Wife will discuss with pt and they will start this this weekend.

## 2014-11-06 NOTE — Telephone Encounter (Signed)
Agree with plan. Would keep a BP log for about 2 weeks and call or email BP's and specific medication timing so we can review and make adjustments as needed. Could arrange a PA/NP visit to review this as well. thx

## 2014-11-06 NOTE — Telephone Encounter (Signed)
I spoke with Bjorn Loserhonda and made her aware of Dr Earmon Phoenixooper's recommendations.

## 2014-11-14 ENCOUNTER — Other Ambulatory Visit: Payer: Self-pay | Admitting: Cardiovascular Disease

## 2014-11-14 ENCOUNTER — Other Ambulatory Visit: Payer: Self-pay | Admitting: Neurology

## 2014-11-15 ENCOUNTER — Other Ambulatory Visit: Payer: Self-pay | Admitting: Cardiovascular Disease

## 2014-11-15 NOTE — Telephone Encounter (Signed)
Last OV note says: Continue Gabapentin 200 mg three times a day

## 2014-11-22 ENCOUNTER — Encounter: Payer: Self-pay | Admitting: Cardiovascular Disease

## 2014-11-22 ENCOUNTER — Other Ambulatory Visit (INDEPENDENT_AMBULATORY_CARE_PROVIDER_SITE_OTHER): Payer: Medicare Other | Admitting: *Deleted

## 2014-11-22 ENCOUNTER — Ambulatory Visit (INDEPENDENT_AMBULATORY_CARE_PROVIDER_SITE_OTHER): Payer: Medicare Other | Admitting: Cardiovascular Disease

## 2014-11-22 VITALS — BP 154/72 | HR 71 | Ht 71.0 in | Wt 187.4 lb

## 2014-11-22 DIAGNOSIS — I2510 Atherosclerotic heart disease of native coronary artery without angina pectoris: Secondary | ICD-10-CM

## 2014-11-22 DIAGNOSIS — I10 Essential (primary) hypertension: Secondary | ICD-10-CM

## 2014-11-22 DIAGNOSIS — E785 Hyperlipidemia, unspecified: Secondary | ICD-10-CM

## 2014-11-22 DIAGNOSIS — I5022 Chronic systolic (congestive) heart failure: Secondary | ICD-10-CM | POA: Diagnosis not present

## 2014-11-22 LAB — HEPATIC FUNCTION PANEL
ALBUMIN: 4 g/dL (ref 3.5–5.2)
ALT: 25 U/L (ref 0–53)
AST: 25 U/L (ref 0–37)
Alkaline Phosphatase: 70 U/L (ref 39–117)
BILIRUBIN DIRECT: 0.2 mg/dL (ref 0.0–0.3)
TOTAL PROTEIN: 7.3 g/dL (ref 6.0–8.3)
Total Bilirubin: 0.7 mg/dL (ref 0.2–1.2)

## 2014-11-22 LAB — LIPID PANEL
Cholesterol: 126 mg/dL (ref 0–200)
HDL: 52.6 mg/dL (ref >39.00)
LDL Cholesterol: 52 mg/dL (ref 0–99)
NonHDL: 73.4
TRIGLYCERIDES: 108 mg/dL (ref 0.0–149.0)
Total CHOL/HDL Ratio: 2
VLDL: 21.6 mg/dL (ref 0.0–40.0)

## 2014-11-22 LAB — BASIC METABOLIC PANEL
BUN: 14 mg/dL (ref 6–23)
CALCIUM: 9.3 mg/dL (ref 8.4–10.5)
CO2: 26 mEq/L (ref 19–32)
CREATININE: 1.15 mg/dL (ref 0.40–1.50)
Chloride: 107 mEq/L (ref 96–112)
GFR: 67.22 mL/min (ref >60.00)
Glucose, Bld: 100 mg/dL — ABNORMAL HIGH (ref 70–99)
Potassium: 4.9 mEq/L (ref 3.5–5.1)
Sodium: 138 mEq/L (ref 135–145)

## 2014-11-22 MED ORDER — HYDROCHLOROTHIAZIDE 25 MG PO TABS: 25.0000 mg | ORAL_TABLET | Freq: Every day | ORAL | Status: DC

## 2014-11-22 NOTE — Addendum Note (Signed)
Addended by: Tonita PhoenixBOWDEN, Generoso Cropper K on: 11/22/2014 07:40 AM   Modules accepted: Orders

## 2014-11-22 NOTE — Progress Notes (Signed)
Cardiology Office Note   Date:  11/22/2014   ID:  [name], DOB [birth year], MRN [id number]  PCP:  Default, Provider, MD  Cardiologist:  Tonny BollmanMichael Pruitt Taboada, MD    Chief Complaint  Patient presents with  . Follow-up    Ventricular Fibrillation  . Hypertension     History of Present Illness: [name] is a 69 y.o. male who presents for follow-up of coronary artery disease and ischemic cardiomyopathy. He initially presented in 2009 with an anterior MI complicated by cardiac arrest. He underwent primary PCI of the proximal LAD. He has ultimately undergone ICD placement for severe ischemic cardiomyopathy.  The patient has been having problems with neuropathy. He's had leg pain in the mornings. Symptoms are better after he is up on his feet for a little while. He has checked morning blood pressures and they have been as high as 190 mmHg. He brings in home readings with ranges from 106-152/53-76. He denies chest pain or shortness of breath. He reports mild leg swelling. No orthopnea or PND. He's been compliant with his medicines and he avoids salt.   Past Medical History  Diagnosis Date  . Other and unspecified hyperlipidemia   . Other specified forms of chronic ischemic heart disease     w sever residual LV dysfunc., LVEF less than 30%  . Acute myocardial infarction of other anterior wall, initial episode of care   . Coronary atherosclerosis of native coronary artery     status post anterior MI 2008 w V. fib arrest  . Ventricular fibrillation     implantable cardiac defib ?  . Macular degeneration   . Dyslipidemia   . Hypertension   . Other vitamin B12 deficiency anemia 08/14/2013  . Polyneuropathy in other diseases classified elsewhere 02/12/2014    Past Surgical History  Procedure Laterality Date  . Icd medtronic implanted  07/06/08    Dr. Sharrell KuGreg Taylor  . Cardiac catheterization  09/12/2007  . Cataract extraction Bilateral     Current Outpatient Prescriptions    Medication Sig Dispense Refill  . aspirin (ASPIR-81) 81 MG EC tablet Take 81 mg by mouth daily.      . carvedilol (COREG) 25 MG tablet TAKE 1 TABLET BY MOUTH TWICE A DAY 60 tablet 2  . digoxin (LANOXIN) 0.125 MG tablet TAKE ONE TABLET BY MOUTH ONE TIME DAILY 30 tablet 2  . gabapentin (NEURONTIN) 100 MG capsule Take 2 capsules (200 mg total) by mouth 3 (three) times daily. 180 capsule 5  . lisinopril (PRINIVIL,ZESTRIL) 40 MG tablet TAKE 1 TABLET BY MOUTH EVERY DAY 30 tablet 2  . nitroGLYCERIN (NITROSTAT) 0.4 MG SL tablet Place 0.4 mg under the tongue every 5 (five) minutes as needed for chest pain (MAX 3 TABLETS).     Marland Kitchen. simvastatin (ZOCOR) 40 MG tablet TAKE 1 TABLET BY MOUTH AT BEDTIME 30 tablet 2  . vitamin B-12 (CYANOCOBALAMIN) 1000 MCG tablet Take 1,000 mcg by mouth daily.    . hydrochlorothiazide (HYDRODIURIL) 25 MG tablet Take 1 tablet (25 mg total) by mouth daily. 90 tablet 3   No current facility-administered medications for this visit.    Allergies:   Review of patient's allergies indicates no known allergies.   Social History:  The patient  reports that he has quit smoking. He has never used smokeless tobacco. He reports that he drinks about 3.5 oz of alcohol per week. He reports that he does not use illicit drugs.   Family History:  The patient's  family history includes Cancer - Colon in his sister; Heart attack in his father.    ROS:  Please see the history of present illness.  Positive for leg pain. All other systems are reviewed and negative.    PHYSICAL EXAM: VS:  BP 154/72 mmHg  Pulse 71  Ht  (1.803 m)  Wt 187 lb 6.4 oz (85.004 kg)  BMI 26.15 kg/m2 , BMI Body mass index is 26.15 kg/(m^2). GEN: Well nourished, well developed, in no acute distress HEENT: normal Neck: no JVD, no masses. No carotid bruits Cardiac: RRR without murmur or gallop                Respiratory:  clear to auscultation bilaterally, normal work of breathing GI: soft, nontender,  nondistended, + BS MS: no deformity or atrophy Ext: Trace pretibial edema, pedal pulses 2+= bilaterally Skin: warm and dry, no rash Neuro:  Strength and sensation are intact Psych: euthymic mood, full affect  EKG:  EKG is ordered today. The ekg ordered today shows NSR 71 bpm, Age-indeterminate septal infarct. ST-T abnormality consider anterolateral ischemia.  Recent Labs: No results found for requested labs within last 365 days.   Lipid Panel     Component Value Date/Time   CHOL 151 10/17/2013 0739   TRIG 166.0* 10/17/2013 0739   HDL 33.40* 10/17/2013 0739   CHOLHDL 5 10/17/2013 0739   VLDL 33.2 10/17/2013 0739   LDLCALC 84 10/17/2013 0739      Wt Readings from Last 3 Encounters:  11/22/14 187 lb 6.4 oz (85.004 kg)  10/16/14 184 lb 12.8 oz (83.825 kg)  08/29/14 170 lb (77.111 kg)    ASSESSMENT AND PLAN: 1.  Chronic systolic heart failure, New York Heart Association functional class II: The patient is stable. He is tolerating his medical program. Activity is most limited by his leg problems.  2. Coronary artery disease without symptoms of angina: Continue aspirin for antiplatelet therapy. He may require colonoscopy/EGD and he would be at low risk of proceeding. He is not on antiplatelet therapy will except for a low-dose aspirin.  3. Essential hypertension: Blood pressure control is suboptimal. Will add hydrochlorothiazide 25 mg daily. He has been on 50 mg in the past, but has not taken any diuretic therapy in over one month.  4. Hyperlipidemia: The patient is on simvastatin. Will update LFTs and a lipid panel today.   Current medicines are reviewed with the patient today.  The patient does not have concerns regarding medicines.  Labs/ tests ordered today include:   Orders Placed This Encounter  Procedures  . EKG 12-Lead    Disposition:   FU 6 months  Signed, Tonny Bollman, MD  11/22/2014 12:52 PM    [facility] [address] [place],  [place], [place]  [address] Phone: [phone]; Fax: [phone]

## 2014-11-22 NOTE — Patient Instructions (Signed)
Medication Instructions:  Your physician has recommended you make the following change in your medication:  1. START Hydrochlorothiazide (HCTZ) 25mg  take one by mouth daily  Labwork: Please go to the lab for lipid, liver and BMP.  Testing/Procedures: No new orders.  Follow-Up: Your physician wants you to follow-up in: 6 MONTHS with Dr Excell Seltzerooper.  You will receive a reminder letter in the mail two months in advance. If you don't receive a letter, please call our office to schedule the follow-up appointment.  Any Other Special Instructions Will Be Listed Below (If Applicable).

## 2014-11-29 ENCOUNTER — Telehealth: Payer: Self-pay | Admitting: Neurology

## 2014-11-29 NOTE — Telephone Encounter (Signed)
I called the patient's wife. She stated the patient has had the pain for about a week now and the gabapentin doesn't touch it. His neuropathy otherwise is about the same. The patient does not take any other pain meds.

## 2014-11-29 NOTE — Telephone Encounter (Signed)
I called the patient. Over the last week, he has had onset of low back pain without radiation down the legs. I do not believe this is related to his peripheral neuropathy. I recommended going on a low dose prednisone Dosepak, the wife wants to discuss it with him first, they will call us back.

## 2014-11-29 NOTE — Telephone Encounter (Signed)
Patient's wife is calling. Patient's lower back is really bothering him really bad. Could this be a result of neuropathy? Please call and advise.

## 2014-12-06 ENCOUNTER — Telehealth: Payer: Self-pay | Admitting: Cardiovascular Disease

## 2014-12-06 NOTE — Telephone Encounter (Signed)
Pt's wife states pt is napping now and unable to speak to me. Pt's wife states she has observed pt doing a lot of coughing, pt coughs all the time,day or night, no pattern to coughing, pt's wife states she feels he is trying to cough something up,but cough is non productive. Pt's wife states pt had 2 bad coughing spells during the night last night, pt got up and slept in his recliner the rest of the night.  Pt's wife states that she is not sure if pt discussed these symptoms with Dr Excell Seltzerooper when he saw him recently, she states she is unsure if symptoms are better or worse than at time of appt with Dr Excell Seltzerooper.  Pt's wife unable to give me more specific symptoms such as weight gain, SOB with exertion, LE edema.  Pt's wife advised I will forward to Dr Excell Seltzerooper for review.

## 2014-12-06 NOTE — Telephone Encounter (Signed)
New Message      Pt's wife calling stating that the pt has been coughing really bad as if he has something in his throat that is trying to come up. States that pt has gained a lot of weight within the last year, and within the last 2-3 weeks the pt has had to get up in the middle of the night because of the cough and feeling like he can't breathe. Please call back and advise.

## 2014-12-12 NOTE — Telephone Encounter (Signed)
Recently seen - chart reviewed and no mention of cough. Doubt cardiac-related unless other symptoms associated.

## 2015-01-15 ENCOUNTER — Ambulatory Visit (INDEPENDENT_AMBULATORY_CARE_PROVIDER_SITE_OTHER): Payer: Medicare Other | Admitting: *Deleted

## 2015-01-15 DIAGNOSIS — I255 Ischemic cardiomyopathy: Secondary | ICD-10-CM

## 2015-01-15 NOTE — Progress Notes (Signed)
Remote ICD transmission.   

## 2015-01-20 LAB — CUP PACEART REMOTE DEVICE CHECK
Battery Voltage: 2.76 V
Brady Statistic RV Percent Paced: 0 %
HighPow Impedance: 47 Ohm
Lead Channel Impedance Value: 496 Ohm
Lead Channel Sensing Intrinsic Amplitude: 9.8 mV
Lead Channel Setting Pacing Amplitude: 2.5 V
Lead Channel Setting Pacing Pulse Width: 0.4 ms
Lead Channel Setting Sensing Sensitivity: 0.6 mV
MDC IDC SESS DTM: 20160612133600
MDC IDC SET ZONE DETECTION INTERVAL: 240 ms
MDC IDC SET ZONE DETECTION INTERVAL: 270 ms
Zone Setting Detection Interval: 310 ms

## 2015-01-22 ENCOUNTER — Telehealth: Payer: Self-pay | Admitting: Neurology

## 2015-01-22 MED ORDER — GABAPENTIN 300 MG PO CAPS: 300.0000 mg | ORAL_CAPSULE | Freq: Three times a day (TID) | ORAL | Status: DC

## 2015-01-22 NOTE — Telephone Encounter (Signed)
I called the patient, talk with the wife. The patient is on 200 mg 3 times daily of gabapentin, I will call in the 300 mg capsules have him take 1 capsule 3 times daily.

## 2015-01-22 NOTE — Telephone Encounter (Signed)
Patient's wife is calling as the patient's needs to increase his dosage of gabapentin 100 mg. He is taking 200 mg 3 X per day currently.  The doctor formally stated that anytime he needed more  it would be OK.  What dosage woulld the doctor recommend for the patient as his pain has worsened.  Please call to advise.  Thanks!

## 2015-01-23 ENCOUNTER — Encounter: Payer: Self-pay | Admitting: Cardiology

## 2015-01-25 ENCOUNTER — Encounter: Payer: Self-pay | Admitting: Internal Medicine

## 2015-02-14 ENCOUNTER — Telehealth: Payer: Self-pay | Admitting: Adult Health

## 2015-02-14 MED ORDER — GABAPENTIN 100 MG PO CAPS: 100.0000 mg | ORAL_CAPSULE | Freq: Three times a day (TID) | ORAL | Status: DC

## 2015-02-14 NOTE — Telephone Encounter (Signed)
Pt's wife called and states that her husbands Rx for gabapentin (NEURONTIN) 300 MG capsule is too high of a dosage for him. He feel like he is burning up inside. Trouble breathing when he lays down. Pt wants to know if they can lower the dosage on the med back to 100mg . Please call and advise [phone].

## 2015-02-14 NOTE — Telephone Encounter (Signed)
Called and spoke to patient's wife . Patient is fine he just does not like the way 300 mg makes him feel. Patient wants to go back to 100 mg.

## 2015-02-14 NOTE — Telephone Encounter (Signed)
I have ordered the 100 mg capsules of gabapentin to be taken 3 times a day. Please call the patient and make him aware.

## 2015-02-14 NOTE — Addendum Note (Signed)
Addended by: Enedina FinnerMILLIKAN, Mouhamad Teed P on: 02/14/2015 05:40 PM   Modules accepted: Orders

## 2015-02-15 ENCOUNTER — Other Ambulatory Visit: Payer: Self-pay | Admitting: Cardiovascular Disease

## 2015-02-15 NOTE — Telephone Encounter (Signed)
I called and spoke to wife and told her message below from MM/NP  Sent to pharmacy.

## 2015-02-20 ENCOUNTER — Encounter: Payer: Self-pay | Admitting: Adult Health

## 2015-02-20 ENCOUNTER — Ambulatory Visit (INDEPENDENT_AMBULATORY_CARE_PROVIDER_SITE_OTHER): Payer: Medicare Other | Admitting: Adult Health

## 2015-02-20 VITALS — BP 118/67 | HR 74 | Ht 71.0 in | Wt 186.0 lb

## 2015-02-20 DIAGNOSIS — G609 Hereditary and idiopathic neuropathy, unspecified: Secondary | ICD-10-CM

## 2015-02-20 DIAGNOSIS — E538 Deficiency of other specified B group vitamins: Secondary | ICD-10-CM | POA: Diagnosis not present

## 2015-02-20 NOTE — Patient Instructions (Signed)
Continue Gabapentin 100 mg three times a day. If your symptoms worsen or you develop new symptoms please let us know.

## 2015-02-20 NOTE — Progress Notes (Signed)
I have read the note, and I agree with the clinical assessment and plan.  Ryin Schillo KEITH   

## 2015-02-20 NOTE — Progress Notes (Signed)
PATIENT: [name] DOB: [birth year]  REASON FOR VISIT: follow up- peripheral neuropathy, B12 deficiency HISTORY FROM: patient  HISTORY OF PRESENT ILLNESS:. [name] is a 69 year old male with a history of peripheral neuropathy and vitamin B12 deficiency. He returns today for follow-up. The patient is currently taking gabapentin 100 mg 3 times a day. He reports that this controls his discomfort quite well. The patient tried increasing his gabapentin but was unable to tolerate it. He states that the higher doses make him feel "hot" and he became very sweaty. He states that once he decreased his dose the symptoms subsided. Patient states that he has numbness in both feet left is greater than the right. He has burning and tingling that extends up to the mid calf. He states that it is worse at night however his current dose of gabapentin is beneficial. The patient has noticed some changes with his balance but denies any falls. He will occasionally use a cane if he has to walk for long distances. Denies any new neurological symptoms. Returns today for an evaluation.  HISTORY 08/29/14: [name] is a 69 year old male with a history of peripheral neuropathy and vitamin B-12 deficiency. He returns today for follow-up. He is currently taking gabapentin 200 mg 3 times a day. He reports that this has controlled his discomfort. The patient is an able to ambulate without assistance. He does feel unstable at times but has not had any falls. He states that when he first gets up in the mornings he will be slightly unsteady. He does have a cane but does not use all the time. No new medical history since last seen. Overall he is doing well.   HISTORY 02/12/14 ([facility]): [name] is a 69 year old left-handed white male with a history of a peripheral neuropathy. The patient was found to have a vitamin B12 deficiency, and he currently is on oral supplementation taking 1000 mcg of vitamin B12 daily. He still  has a lot of discomfort in the feet in the evening hours, and first thing in the morning when he gets up. The feet remain numb. He reports some mild gait instability at times, but he denies any falls. He is on low-dose gabapentin taking 100 mg twice during the day, and 2 at night. He generally sleeps fairly well at night. The gabapentin does help the pain from the neuropathy. He returns to this office for an evaluation.   REVIEW OF SYSTEMS: Out of a complete 14 system review of symptoms, the patient complains only of the following symptoms, and all other reviewed systems are negative.  See history of present illness  ALLERGIES: No Known Allergies  HOME MEDICATIONS: Outpatient Prescriptions Prior to Visit  Medication Sig Dispense Refill  . aspirin (ASPIR-81) 81 MG EC tablet Take 81 mg by mouth daily.      . carvedilol (COREG) 25 MG tablet TAKE 1 TABLET BY MOUTH TWICE A DAY 60 tablet 2  . digoxin (LANOXIN) 0.125 MG tablet TAKE ONE TABLET BY MOUTH ONE TIME DAILY 30 tablet 2  . gabapentin (NEURONTIN) 100 MG capsule Take 1 capsule (100 mg total) by mouth 3 (three) times daily. 90 capsule 3  . hydrochlorothiazide (HYDRODIURIL) 25 MG tablet Take 1 tablet (25 mg total) by mouth daily. 90 tablet 3  . lisinopril (PRINIVIL,ZESTRIL) 40 MG tablet TAKE 1 TABLET BY MOUTH EVERY DAY 30 tablet 2  . nitroGLYCERIN (NITROSTAT) 0.4 MG SL tablet Place 0.4 mg under the tongue every 5 (five) minutes as  needed for chest pain (MAX 3 TABLETS).     Marland Kitchen simvastatin (ZOCOR) 40 MG tablet TAKE 1 TABLET BY MOUTH AT BEDTIME 30 tablet 3  . vitamin B-12 (CYANOCOBALAMIN) 1000 MCG tablet Take 1,000 mcg by mouth daily.    . simvastatin (ZOCOR) 40 MG tablet TAKE 1 TABLET BY MOUTH AT BEDTIME (Patient not taking: Reported on 02/20/2015) 30 tablet 2   No facility-administered medications prior to visit.    PAST MEDICAL HISTORY: Past Medical History  Diagnosis Date  . Other and unspecified hyperlipidemia   . Other specified forms of  chronic ischemic heart disease     w sever residual LV dysfunc., LVEF less than 30%  . Acute myocardial infarction of other anterior wall, initial episode of care   . Coronary atherosclerosis of native coronary artery     status post anterior MI 2008 w V. fib arrest  . Ventricular fibrillation     implantable cardiac defib ?  . Macular degeneration   . Dyslipidemia   . Hypertension   . Other vitamin B12 deficiency anemia 08/14/2013  . Polyneuropathy in other diseases classified elsewhere 02/12/2014    PAST SURGICAL HISTORY: Past Surgical History  Procedure Laterality Date  . Icd medtronic implanted  07/06/08    Dr. Sharrell Ku  . Cardiac catheterization  09/12/2007  . Cataract extraction Bilateral     FAMILY HISTORY: Family History  Problem Relation Age of Onset  . Heart attack Father   . Cancer - Colon Sister     SOCIAL HISTORY: History   Social History  . Marital Status: Married    Spouse Name: [name]  . Number of Children: 4  . Years of Education: HS   Occupational History  . Retired    Social History Main Topics  . Smoking status: Former Games developer  . Smokeless tobacco: Never Used     Comment: QUIT 09/2007  . Alcohol Use: 4.2 oz/week    7 Standard drinks or equivalent per week     Comment: beer  . Drug Use: No  . Sexual Activity: Not on file   Other Topics Concern  . Not on file   Social History Narrative   Patient lives at home with his wife [name]).   Retired patient works part Scientific laboratory technician work.   Education high school.   Left handed.   Caffeine one cup of coffee daily.       PHYSICAL EXAM  Filed Vitals:   02/20/15 0818  BP: 118/67  Pulse: 74  Height: 5\' 11"  (1.803 m)  Weight: 186 lb (84.369 kg)   Body mass index is 25.95 kg/(m^2).  Generalized: Well developed, in no acute distress   Neurological examination  Mentation: Alert oriented to time, place, history taking. Follows all commands speech and language fluent Cranial nerve II-XII:  Pupils were equal round reactive to light. Extraocular movements were full, visual field were full on confrontational test. Facial sensation and strength were normal. Uvula tongue midline. Head turning and shoulder shrug  were normal and symmetric. Motor: The motor testing reveals 5 over 5 strength of all 4 extremities. Good symmetric motor tone is noted throughout.  Sensory: Sensory testing is intact to soft touch on all 4 extremities. No evidence of extinction is noted.  Coordination: Cerebellar testing reveals good finger-nose-finger and heel-to-shin bilaterally.  Gait and station: Gait is normal. Tandem gait is slightly unsteady. Romberg is negative. No drift is seen.  Reflexes: Deep tendon reflexes are symmetric and normal bilaterally.   DIAGNOSTIC  DATA (LABS, IMAGING, TESTING) - I reviewed patient records, labs, notes, testing and imaging myself where available.   Lab Results  Component Value Date   VITAMINB12 1708* 02/12/2014       ASSESSMENT AND PLAN 69 y.o. year old male  has a past medical history of Other and unspecified hyperlipidemia; Other specified forms of chronic ischemic heart disease; Acute myocardial infarction of other anterior wall, initial episode of care; Coronary atherosclerosis of native coronary artery; Ventricular fibrillation; Macular degeneration; Dyslipidemia; Hypertension; Other vitamin B12 deficiency anemia (08/14/2013); and Polyneuropathy in other diseases classified elsewhere (02/12/2014). here with:  1. Peripheral neuropathy 2. Vitamin B12 deficiency  Overall the patient is doing well. He will continue gabapentin 100 mg times a day. Advised the patient that if his discomfort worsens he should let us know. The patient continues with his vitamin B12 supplement. Patient advised that if his symptoms worsen or he develops new symptoms he should let us know. Otherwise he will follow-up in 6 months or sooner if needed.     Butch PennyMegan Marquee Fuchs, MSN, NP-C 02/20/2015,  8:23 AM [facility] [address], [address] [place], [place] [address] [phone]  Note: This document was prepared with digital dictation and possible smart phrase technology. Any transcriptional errors that result from this process are unintentional.

## 2015-04-02 ENCOUNTER — Other Ambulatory Visit: Payer: Self-pay | Admitting: Cardiovascular Disease

## 2015-04-02 ENCOUNTER — Telehealth: Payer: Self-pay | Admitting: Neurology

## 2015-04-02 MED ORDER — GABAPENTIN 100 MG PO CAPS: ORAL_CAPSULE | ORAL | Status: DC

## 2015-04-02 NOTE — Telephone Encounter (Signed)
Rx updated and sent per instruction.  I called back to advise.  They expressed understanding and appreciation.

## 2015-04-02 NOTE — Telephone Encounter (Signed)
In the past the patient was unable to tolerate increased dose of gabapentin as it caused him to feel hot and become diaphoretic. If he is tolerating the increase in gabapentin we can adjust the quantity.

## 2015-04-02 NOTE — Telephone Encounter (Signed)
Last OV note from 07/20 says: Continue Gabapentin 100 mg three times a day. I called back.  Patient says he was taking one three times daily, and it was not quite enough, so he has decided to start taking Rx as follows ,  and .  He is requesting a new Rx for this dose for a quantity of #150.  We did discuss the importance of taking meds as prescribed unless instructed otherwise by the provider.  Please advise.  Thank you.

## 2015-04-02 NOTE — Telephone Encounter (Signed)
Patient's wife called stating pt will be taking gabapentin (NEURONTIN) 100 MG capsule 2 in am 1 at noon and 2 at pm. She states quantity needs to be increased. Please call and advise. She can be reached at [phone].

## 2015-04-16 ENCOUNTER — Ambulatory Visit (INDEPENDENT_AMBULATORY_CARE_PROVIDER_SITE_OTHER): Payer: Medicare Other | Admitting: *Deleted

## 2015-04-16 DIAGNOSIS — I255 Ischemic cardiomyopathy: Secondary | ICD-10-CM

## 2015-04-16 NOTE — Progress Notes (Signed)
Remote ICD transmission.   

## 2015-04-25 LAB — CUP PACEART REMOTE DEVICE CHECK
Battery Voltage: 2.72 V
Brady Statistic RV Percent Paced: 0 %
HIGH POWER IMPEDANCE MEASURED VALUE: 47 Ohm
Lead Channel Sensing Intrinsic Amplitude: 10.3 mV
Lead Channel Setting Pacing Pulse Width: 0.4 ms
MDC IDC MSMT LEADCHNL RV IMPEDANCE VALUE: 504 Ohm
MDC IDC SESS DTM: 20160913144100
MDC IDC SET LEADCHNL RV PACING AMPLITUDE: 2.5 V
MDC IDC SET LEADCHNL RV SENSING SENSITIVITY: 0.6 mV
MDC IDC SET ZONE DETECTION INTERVAL: 270 ms
Zone Setting Detection Interval: 240 ms
Zone Setting Detection Interval: 310 ms

## 2015-05-06 ENCOUNTER — Other Ambulatory Visit: Payer: Self-pay | Admitting: Cardiovascular Disease

## 2015-05-10 ENCOUNTER — Encounter: Payer: Self-pay | Admitting: Cardiology

## 2015-05-20 ENCOUNTER — Other Ambulatory Visit: Payer: Self-pay | Admitting: Cardiovascular Disease

## 2015-05-22 ENCOUNTER — Encounter: Payer: Self-pay | Admitting: Internal Medicine

## 2015-05-29 ENCOUNTER — Other Ambulatory Visit: Payer: Self-pay | Admitting: Cardiovascular Disease

## 2015-06-02 ENCOUNTER — Other Ambulatory Visit: Payer: Self-pay | Admitting: Cardiovascular Disease

## 2015-06-18 ENCOUNTER — Telehealth: Payer: Self-pay | Admitting: Neurology

## 2015-06-18 MED ORDER — GABAPENTIN 300 MG PO CAPS: 300.0000 mg | ORAL_CAPSULE | Freq: Three times a day (TID) | ORAL | Status: DC

## 2015-06-18 NOTE — Telephone Encounter (Signed)
I called back and spoke with Ms [name].  She said the patient no longer feels he is getting adequate response from Gabapentin 100mg  two, one, two dose.  Says he has to take extra at times, and would like to know if provider would be agreeable to increasing dose to 300mg  caps one three times daily.  Please advise.  Thank you.

## 2015-06-18 NOTE — Telephone Encounter (Signed)
Ok to increase to 300 mg TID. However in the past he has not been able to tolerate higher dosages because it made him feel hot and sweaty. If this happens with the increase the medication will have to be discontinued. Please inform the patient of this.

## 2015-06-18 NOTE — Telephone Encounter (Signed)
Patients's wife [name] is calling and states her husband realizes he needs a stronger dosage of the Rx Gabapentin. He thinks he needs to go back on the 300 mg dosage.  Please call.

## 2015-06-18 NOTE — Telephone Encounter (Signed)
I called back.  Got no answer.  Left message relaying providers note.   Updated Rx has been sent.  Receipt confirmed by pharmacy.

## 2015-06-20 ENCOUNTER — Encounter: Payer: Self-pay | Admitting: Cardiovascular Disease

## 2015-06-20 ENCOUNTER — Ambulatory Visit (INDEPENDENT_AMBULATORY_CARE_PROVIDER_SITE_OTHER): Payer: Medicare Other | Admitting: Cardiovascular Disease

## 2015-06-20 VITALS — BP 118/60 | HR 73 | Ht 71.0 in | Wt 189.8 lb

## 2015-06-20 DIAGNOSIS — I2510 Atherosclerotic heart disease of native coronary artery without angina pectoris: Secondary | ICD-10-CM

## 2015-06-20 DIAGNOSIS — I10 Essential (primary) hypertension: Secondary | ICD-10-CM | POA: Diagnosis not present

## 2015-06-20 DIAGNOSIS — I5022 Chronic systolic (congestive) heart failure: Secondary | ICD-10-CM | POA: Diagnosis not present

## 2015-06-20 MED ORDER — HYDROCHLOROTHIAZIDE 25 MG PO TABS: 25.0000 mg | ORAL_TABLET | Freq: Every day | ORAL | Status: DC

## 2015-06-20 MED ORDER — DIGOXIN 125 MCG PO TABS: ORAL_TABLET | ORAL | Status: DC

## 2015-06-20 MED ORDER — SIMVASTATIN 40 MG PO TABS: 40.0000 mg | ORAL_TABLET | Freq: Every day | ORAL | Status: DC

## 2015-06-20 MED ORDER — LISINOPRIL 40 MG PO TABS: 40.0000 mg | ORAL_TABLET | Freq: Every day | ORAL | Status: DC

## 2015-06-20 MED ORDER — CARVEDILOL 25 MG PO TABS: 25.0000 mg | ORAL_TABLET | Freq: Two times a day (BID) | ORAL | Status: DC

## 2015-06-20 NOTE — Patient Instructions (Signed)
Medication Instructions:  Your physician recommends that you continue on your current medications as directed. Please refer to the Current Medication list given to you today.  Labwork: Your physician recommends that you return for a FASTING CK, CMP and LIPID--nothing to eat or drink after midnight, lab opens at 7:30 AM  Testing/Procedures: No new orders.   Follow-Up: Your physician wants you to follow-up in: 6 MONTHS with Dr Excell Seltzerooper.  You will receive a reminder letter in the mail two months in advance. If you don't receive a letter, please call our office to schedule the follow-up appointment.   Any Other Special Instructions Will Be Listed Below (If Applicable).     If you need a refill on your cardiac medications before your next appointment, please call your pharmacy.

## 2015-06-20 NOTE — Progress Notes (Signed)
Cardiology Office Note Date:  06/20/2015   ID:  [name], DOB 1946/04/29, MRN [id number]  PCP:  No PCP Per Patient  Cardiologist:  Sherren Mocha, MD    Chief Complaint  Patient presents with  . Shortness of Breath   History of Present Illness: [name] is a 69 y.o. male who presents for follow-up of coronary artery disease and ischemic cardiomyopathy. He initially presented in 2009 with an anterior MI complicated by cardiac arrest. He underwent primary PCI of the proximal LAD. He has ultimately undergone ICD placement for severe ischemic cardiomyopathy.  From a cardiac perspective he continues to do well. Today, he denies symptoms of palpitations, chest pain, orthopnea, PND, lower extremity edema, dizziness, or syncope.  He does have shortness of breath on occasion at nighttime and in the morning. Otherwise has no symptoms. He continues to have significant limitation related to peripheral neuropathy. He's unable to do a lot of walking. He complains of pain and numbness in his feet.  He does complain of upper back, neck, and shoulder pain when his arms are above his head associated with doing work. Otherwise no complaints. Feels like a muscle soreness.  Past Medical History  Diagnosis Date  . Other and unspecified hyperlipidemia   . Other specified forms of chronic ischemic heart disease     w sever residual LV dysfunc., LVEF less than 30%  . Acute myocardial infarction of other anterior wall, initial episode of care   . Coronary atherosclerosis of native coronary artery     status post anterior MI 2008 w V. fib arrest  . Ventricular fibrillation ([place])     implantable cardiac defib ?  . Macular degeneration   . Dyslipidemia   . Hypertension   . Other vitamin B12 deficiency anemia 08/14/2013  . Polyneuropathy in other diseases classified elsewhere [facility]) 02/12/2014    Past Surgical History  Procedure Laterality Date  . Icd medtronic implanted  07/06/08    Dr.  Crissie Sickles  . Cardiac catheterization  09/12/2007  . Cataract extraction Bilateral     Current Outpatient Prescriptions  Medication Sig Dispense Refill  . aspirin (ASPIR-81) 81 MG EC tablet Take 81 mg by mouth daily.      . carvedilol (COREG) 25 MG tablet Take 1 tablet (25 mg total) by mouth 2 (two) times daily. 60 tablet 11  . digoxin (LANOXIN) 0.125 MG tablet TAKE 1 TABLET (0.125 MG TOTAL) BY MOUTH DAILY. 30 tablet 11  . gabapentin (NEURONTIN) 300 MG capsule Take 1 capsule (300 mg total) by mouth 3 (three) times daily. 90 capsule 3  . hydrochlorothiazide (HYDRODIURIL) 25 MG tablet Take 1 tablet (25 mg total) by mouth daily. 30 tablet 11  . lisinopril (PRINIVIL,ZESTRIL) 40 MG tablet Take 1 tablet (40 mg total) by mouth daily. 30 tablet 11  . nitroGLYCERIN (NITROSTAT) 0.4 MG SL tablet Place 0.4 mg under the tongue every 5 (five) minutes as needed for chest pain (MAX 3 TABLETS).     Marland Kitchen simvastatin (ZOCOR) 40 MG tablet Take 1 tablet (40 mg total) by mouth at bedtime. 30 tablet 11  . vitamin B-12 (CYANOCOBALAMIN) 1000 MCG tablet Take 1,000 mcg by mouth daily.     No current facility-administered medications for this visit.    Allergies:   Review of patient's allergies indicates no known allergies.   Social History:  The patient  reports that he has quit smoking. He has never used smokeless tobacco. He reports that he drinks about 4.2  oz of alcohol per week. He reports that he does not use illicit drugs.   Family History:  The patient's  family history includes Cancer - Colon in his sister; Heart attack in his father.    ROS:  Please see the history of present illness.  All other systems are reviewed and negative.    PHYSICAL EXAM: VS:  BP 118/60 mmHg  Pulse 73  Ht 5' 11"  (1.803 m)  Wt 189 lb 12.8 oz (86.093 kg)  BMI 26.48 kg/m2 , BMI Body mass index is 26.48 kg/(m^2). GEN: Well nourished, well developed, in no acute distress HEENT: normal Neck: no JVD, no masses. No carotid  bruits Cardiac: RRR without murmur or gallop                Respiratory:  clear to auscultation bilaterally, normal work of breathing GI: soft, nontender, nondistended, + BS MS: no deformity or atrophy Ext: no pretibial edema, pedal pulses 2+= bilaterally Skin: warm and dry, no rash Neuro:  Strength and sensation are intact Psych: euthymic mood, full affect  EKG:  EKG is ordered today. The ekg ordered today shows  Normal sinus rhythm 73 bpm, nonspecific IVCD, leftward axis, ST and T-wave abnormality consider lateral ischemia, age-indeterminate septal infarct.  Recent Labs: 11/22/2014: ALT 25; BUN 14; Creatinine, Ser 1.15; Potassium 4.9; Sodium 138   Lipid Panel     Component Value Date/Time   CHOL 126 11/22/2014 0741   TRIG 108.0 11/22/2014 0741   HDL 52.60 11/22/2014 0741   CHOLHDL 2 11/22/2014 0741   VLDL 21.6 11/22/2014 0741   LDLCALC 52 11/22/2014 0741    Wt Readings from Last 3 Encounters:  06/20/15 189 lb 12.8 oz (86.093 kg)  02/20/15 186 lb (84.369 kg)  11/22/14 187 lb 6.4 oz (85.004 kg)    Cardiac Studies Reviewed: 2-D echocardiogram 10/17/2013: Study Conclusions - Left ventricle: Mid and distal anterior wall septal apical and inferoapical akinesis The cavity size was severely dilated. Wall thickness was increased in a pattern of mild LVH. The estimated ejection fraction was 30%. - Aortic valve: Trivial regurgitation. - Mitral valve: Mild regurgitation. - Left atrium: The atrium was mildly dilated. - Atrial septum: No defect or patent foramen ovale was identified.  ASSESSMENT AND PLAN: 1. Chronic systolic heart failure, New York Heart Association functional class II: The patient is stable. He is tolerating his medical program. Activity is most limited by his leg problems.  Will continue goal doses of carvedilol and lisinopril  2. Coronary artery disease without symptoms of angina: Continue aspirin for antiplatelet therapy.   3. Essential hypertension:   Blood pressure was elevated at his last visit. Hydrochlorothiazide was added and he he is now at goal. Continue same medical program. We will update lab work.  4. Hyperlipidemia: The patient is on simvastatin.  Will order lipids and LFTs , as well as a CK level to evaluate his upper back pain.  Current medicines are reviewed with the patient today.  The patient does not have concerns regarding medicines.  Labs/ tests ordered today include:   Orders Placed This Encounter  Procedures  . CK (Creatine Kinase)  . Comp Met (CMET)  . Lipid panel  . EKG 12-Lead   Disposition:   FU 6 months  Signed, Sherren Mocha, MD  06/20/2015 1:33 PM    [facility] [place], [place], [place]  [address] Phone: [phone]; Fax: [phone]

## 2015-06-24 ENCOUNTER — Other Ambulatory Visit (INDEPENDENT_AMBULATORY_CARE_PROVIDER_SITE_OTHER): Payer: Medicare Other | Admitting: *Deleted

## 2015-06-24 DIAGNOSIS — I10 Essential (primary) hypertension: Secondary | ICD-10-CM

## 2015-06-24 DIAGNOSIS — I2510 Atherosclerotic heart disease of native coronary artery without angina pectoris: Secondary | ICD-10-CM | POA: Diagnosis not present

## 2015-06-24 DIAGNOSIS — I5022 Chronic systolic (congestive) heart failure: Secondary | ICD-10-CM

## 2015-06-24 LAB — COMPREHENSIVE METABOLIC PANEL
ALBUMIN: 4 g/dL (ref 3.6–5.1)
ALT: 29 U/L (ref 9–46)
AST: 25 U/L (ref 10–35)
Alkaline Phosphatase: 76 U/L (ref 40–115)
BILIRUBIN TOTAL: 0.5 mg/dL (ref 0.2–1.2)
BUN: 16 mg/dL (ref 7–25)
CHLORIDE: 107 mmol/L (ref 98–110)
CO2: 22 mmol/L (ref 20–31)
CREATININE: 1.41 mg/dL — AB (ref 0.70–1.25)
Calcium: 9.1 mg/dL (ref 8.6–10.3)
Glucose, Bld: 100 mg/dL — ABNORMAL HIGH (ref 65–99)
Potassium: 5.4 mmol/L — ABNORMAL HIGH (ref 3.5–5.3)
SODIUM: 138 mmol/L (ref 135–146)
TOTAL PROTEIN: 7.2 g/dL (ref 6.1–8.1)

## 2015-06-24 LAB — LIPID PANEL
Cholesterol: 133 mg/dL (ref 125–200)
HDL: 41 mg/dL (ref >=40)
LDL CALC: 61 mg/dL (ref <130)
TRIGLYCERIDES: 155 mg/dL — AB (ref <150)
Total CHOL/HDL Ratio: 3.2 Ratio (ref <=5.0)
VLDL: 31 mg/dL — AB (ref <30)

## 2015-06-24 LAB — CK: CK TOTAL: 114 U/L (ref 7–232)

## 2015-06-25 ENCOUNTER — Other Ambulatory Visit: Payer: Self-pay

## 2015-06-25 DIAGNOSIS — I2510 Atherosclerotic heart disease of native coronary artery without angina pectoris: Secondary | ICD-10-CM

## 2015-06-25 DIAGNOSIS — I5022 Chronic systolic (congestive) heart failure: Secondary | ICD-10-CM

## 2015-06-25 DIAGNOSIS — E875 Hyperkalemia: Secondary | ICD-10-CM

## 2015-06-25 DIAGNOSIS — I10 Essential (primary) hypertension: Secondary | ICD-10-CM

## 2015-06-25 MED ORDER — LISINOPRIL 40 MG PO TABS: 20.0000 mg | ORAL_TABLET | Freq: Every day | ORAL | Status: DC

## 2015-07-16 ENCOUNTER — Ambulatory Visit (INDEPENDENT_AMBULATORY_CARE_PROVIDER_SITE_OTHER): Payer: Medicare Other | Admitting: *Deleted

## 2015-07-16 DIAGNOSIS — I255 Ischemic cardiomyopathy: Secondary | ICD-10-CM

## 2015-07-16 NOTE — Progress Notes (Signed)
Remote ICD transmission.   

## 2015-07-20 LAB — CUP PACEART REMOTE DEVICE CHECK
Brady Statistic RV Percent Paced: 0 %
HIGH POWER IMPEDANCE MEASURED VALUE: 47 Ohm
Implantable Lead Implant Date: 20091204
Implantable Lead Location: 753860
Lead Channel Setting Pacing Amplitude: 2.5 V
Lead Channel Setting Pacing Pulse Width: 0.4 ms
Lead Channel Setting Sensing Sensitivity: 0.6 mV
MDC IDC MSMT BATTERY VOLTAGE: 2.66 V
MDC IDC MSMT LEADCHNL RV IMPEDANCE VALUE: 648 Ohm
MDC IDC MSMT LEADCHNL RV SENSING INTR AMPL: 11 mV
MDC IDC SESS DTM: 20161213131700

## 2015-07-23 ENCOUNTER — Other Ambulatory Visit (INDEPENDENT_AMBULATORY_CARE_PROVIDER_SITE_OTHER): Payer: Medicare Other | Admitting: *Deleted

## 2015-07-23 DIAGNOSIS — E875 Hyperkalemia: Secondary | ICD-10-CM | POA: Diagnosis not present

## 2015-07-23 LAB — BASIC METABOLIC PANEL
BUN: 15 mg/dL (ref 7–25)
CALCIUM: 8.9 mg/dL (ref 8.6–10.3)
CHLORIDE: 105 mmol/L (ref 98–110)
CO2: 23 mmol/L (ref 20–31)
CREATININE: 1.43 mg/dL — AB (ref 0.70–1.25)
Glucose, Bld: 112 mg/dL — ABNORMAL HIGH (ref 65–99)
Potassium: 4.7 mmol/L (ref 3.5–5.3)
Sodium: 137 mmol/L (ref 135–146)

## 2015-07-23 NOTE — Addendum Note (Signed)
Addended by: Tonita PhoenixBOWDEN, ROBIN K on: 07/23/2015 07:41 AM   Modules accepted: Orders

## 2015-07-24 ENCOUNTER — Encounter: Payer: Self-pay | Admitting: Cardiology

## 2015-08-29 ENCOUNTER — Encounter: Payer: Self-pay | Admitting: Adult Health

## 2015-08-29 ENCOUNTER — Ambulatory Visit (INDEPENDENT_AMBULATORY_CARE_PROVIDER_SITE_OTHER): Payer: Medicare Other | Admitting: Adult Health

## 2015-08-29 VITALS — BP 141/77 | HR 84 | Resp 20 | Ht 71.0 in | Wt 189.0 lb

## 2015-08-29 DIAGNOSIS — G609 Hereditary and idiopathic neuropathy, unspecified: Secondary | ICD-10-CM

## 2015-08-29 MED ORDER — GABAPENTIN 300 MG PO CAPS: 300.0000 mg | ORAL_CAPSULE | Freq: Three times a day (TID) | ORAL | Status: DC

## 2015-08-29 NOTE — Progress Notes (Signed)
PATIENT: [name] DOB: April 09, 1946  REASON FOR VISIT: follow up- peripheral neuropathy HISTORY FROM: patient  HISTORY OF PRESENT ILLNESS: [name] is a 70 year old male with a history of peripheral neuropathy and vitamin B12 deficiency. He returns today for follow-up. The patient continues to take gabapentin 300 mg 3 times a day. Patient states that he continues to have numbness in the lower extremity. He states that the sharp shooting pains are controlled with gabapentin. The patient states that he has a cane at home but he typically does not use it. The patient states that he does feel that his balance has gotten slightly worse. The patient reports that when it didn't know he fell but this is related to the weather and not his balance. Patient states that he continues to work part-time for [employer] He denies any new neurological symptoms. He returns today for an evaluation.      HISTORY 02/20/15:[name] is a 70 year old male with a history of peripheral neuropathy and vitamin B12 deficiency. He returns today for follow-up. The patient is currently taking gabapentin 100 mg 3 times a day. He reports that this controls his discomfort quite well. The patient tried increasing his gabapentin but was unable to tolerate it. He states that the higher doses make him feel "hot" and he became very sweaty. He states that once he decreased his dose the symptoms subsided. Patient states that he has numbness in both feet left is greater than the right. He has burning and tingling that extends up to the mid calf. He states that it is worse at night however his current dose of gabapentin is beneficial. The patient has noticed some changes with his balance but denies any falls. He will occasionally use a cane if he has to walk for long distances. Denies any new neurological symptoms. Returns today for an evaluation   REVIEW OF SYSTEMS: Out of a complete 14 system review of symptoms, the patient  complains only of the following symptoms, and all other reviewed systems are negative.  See history of present illness  ALLERGIES: No Known Allergies  HOME MEDICATIONS: Outpatient Prescriptions Prior to Visit  Medication Sig Dispense Refill  . aspirin (ASPIR-81) 81 MG EC tablet Take 81 mg by mouth daily.      . carvedilol (COREG) 25 MG tablet Take 1 tablet (25 mg total) by mouth 2 (two) times daily. 60 tablet 11  . digoxin (LANOXIN) 0.125 MG tablet TAKE 1 TABLET (0.125 MG TOTAL) BY MOUTH DAILY. 30 tablet 11  . gabapentin (NEURONTIN) 300 MG capsule Take 1 capsule (300 mg total) by mouth 3 (three) times daily. 90 capsule 3  . hydrochlorothiazide (HYDRODIURIL) 25 MG tablet Take 1 tablet (25 mg total) by mouth daily. 30 tablet 11  . lisinopril (PRINIVIL,ZESTRIL) 40 MG tablet Take 0.5 tablets (20 mg total) by mouth daily. 30 tablet 11  . nitroGLYCERIN (NITROSTAT) 0.4 MG SL tablet Place 0.4 mg under the tongue every 5 (five) minutes as needed for chest pain (MAX 3 TABLETS).     Marland Kitchen simvastatin (ZOCOR) 40 MG tablet Take 1 tablet (40 mg total) by mouth at bedtime. 30 tablet 11  . vitamin B-12 (CYANOCOBALAMIN) 1000 MCG tablet Take 1,000 mcg by mouth daily.     No facility-administered medications prior to visit.    PAST MEDICAL HISTORY: Past Medical History  Diagnosis Date  . Other and unspecified hyperlipidemia   . Other specified forms of chronic ischemic heart disease  w sever residual LV dysfunc., LVEF less than 30%  . Acute myocardial infarction of other anterior wall, initial episode of care   . Coronary atherosclerosis of native coronary artery     status post anterior MI 2008 w V. fib arrest  . Ventricular fibrillation (HCC)     implantable cardiac defib ?  . Macular degeneration   . Dyslipidemia   . Hypertension   . Other vitamin B12 deficiency anemia 08/14/2013  . Polyneuropathy in other diseases classified elsewhere (HCC) 02/12/2014    PAST SURGICAL HISTORY: Past Surgical  History  Procedure Laterality Date  . Icd medtronic implanted  07/06/08    Dr. Sharrell Ku  . Cardiac catheterization  09/12/2007  . Cataract extraction Bilateral     FAMILY HISTORY: Family History  Problem Relation Age of Onset  . Heart attack Father   . Cancer - Colon Sister     SOCIAL HISTORY: Social History   Social History  . Marital Status: Married    Spouse Name: [name]  . Number of Children: 4  . Years of Education: HS   Occupational History  . Retired    Social History Main Topics  . Smoking status: Former Games developer  . Smokeless tobacco: Never Used     Comment: QUIT 09/2007  . Alcohol Use: 4.2 oz/week    7 Standard drinks or equivalent per week     Comment: beer  . Drug Use: No  . Sexual Activity: Not on file   Other Topics Concern  . Not on file   Social History Narrative   Patient lives at home with his wife [name]).   Retired patient works part Scientific laboratory technician work.   Education high school.   Left handed.   Caffeine one cup of coffee daily.       PHYSICAL EXAM  Filed Vitals:   08/29/15 0730  BP: 141/77  Pulse: 84  Resp: 20  Height:  (1.803 m)  Weight: 189 lb (85.73 kg)   Body mass index is 26.37 kg/(m^2).  Generalized: Well developed, in no acute distress   Neurological examination  Mentation: Alert oriented to time, place, history taking. Follows all commands speech and language fluent Cranial nerve II-XII: Pupils were equal round reactive to light. Extraocular movements were full, visual field were full on confrontational test. Facial sensation and strength were normal. Uvula tongue midline. Head turning and shoulder shrug  were normal and symmetric. Motor: The motor testing reveals 5 over 5 strength of all 4 extremities. Good symmetric motor tone is noted throughout.  Sensory: Sensory testing is intact to soft touch on all 4 extremities. Pinprick sensation decreased in the lower extremities. Right greater than left. Vibration sensation  decreased in the left lower extremity. No evidence of extinction is noted.  Coordination: Cerebellar testing reveals good finger-nose-finger and heel-to-shin bilaterally.  Gait and station: Gait is normal. Tandem gait is unsteady. Romberg is negative. No drift is seen.  Reflexes: Deep tendon reflexes are symmetric and normal bilaterally.   DIAGNOSTIC DATA (LABS, IMAGING, TESTING) - I reviewed patient records, labs, notes, testing and imaging myself where available.  Lab Results  Component Value Date   WBC 9.9 10/02/2013   HGB 16.8 10/02/2013   HCT 50.9 10/02/2013   MCV 94.3 10/02/2013   PLT 338.0 10/02/2013      Component Value Date/Time   NA 137 07/23/2015 0803   K 4.7 07/23/2015 0803   CL 105 07/23/2015 0803   CO2 23 07/23/2015 0803  GLUCOSE 112* 07/23/2015 0803   BUN 15 07/23/2015 0803   CREATININE 1.43* 07/23/2015 0803   CREATININE 1.15 11/22/2014 0741   CALCIUM 8.9 07/23/2015 0803   PROT 7.2 06/24/2015 0748   PROT 6.7 08/10/2013 1351   ALBUMIN 4.0 06/24/2015 0748   AST 25 06/24/2015 0748   ALT 29 06/24/2015 0748   ALKPHOS 76 06/24/2015 0748   BILITOT 0.5 06/24/2015 0748   GFRNONAA 68.81 07/18/2010 0826   GFRAA 110 06/27/2008 0839   Lab Results  Component Value Date   CHOL 133 06/24/2015   HDL 41 06/24/2015   LDLCALC 61 06/24/2015   TRIG 155* 06/24/2015   CHOLHDL 3.2 06/24/2015    Lab Results  Component Value Date   VITAMINB12 1708* 02/12/2014   Lab Results  Component Value Date   TSH 0.91 10/02/2013      ASSESSMENT AND PLAN 70 y.o. year old male  has a past medical history of Other and unspecified hyperlipidemia; Other specified forms of chronic ischemic heart disease; Acute myocardial infarction of other anterior wall, initial episode of care; Coronary atherosclerosis of native coronary artery; Ventricular fibrillation (HCC); Macular degeneration; Dyslipidemia; Hypertension; Other vitamin B12 deficiency anemia (08/14/2013); and Polyneuropathy in other  diseases classified elsewhere (HCC) (02/12/2014). here with:  1. Peripheral neuropathy  Overall the patient is doing well. He will continue on gabapentin 300 mg 3 times a day. The patient is instructed that if his balance or gait worsens he should let us know. He will follow-up in 6 months with Dr. Anne Hahn.   Butch Penny, MSN, NP-C 08/29/2015, 7:38 AM [facility] [address], [address] [place], [place] [address] [phone]

## 2015-08-29 NOTE — Patient Instructions (Signed)
Continue gabapentin 300 mg three times a day If your symptoms worsen or you develop new symptoms please let us know.   

## 2015-08-29 NOTE — Progress Notes (Signed)
I have read the note, and I agree with the clinical assessment and plan.  Jorene Kaylor KEITH   

## 2015-10-23 ENCOUNTER — Encounter: Payer: Self-pay | Admitting: Internal Medicine

## 2015-10-23 ENCOUNTER — Ambulatory Visit (INDEPENDENT_AMBULATORY_CARE_PROVIDER_SITE_OTHER): Payer: Medicare Other | Admitting: Internal Medicine

## 2015-10-23 VITALS — BP 118/76 | HR 80 | Ht 71.0 in | Wt 198.0 lb

## 2015-10-23 DIAGNOSIS — I4901 Ventricular fibrillation: Secondary | ICD-10-CM | POA: Diagnosis not present

## 2015-10-23 DIAGNOSIS — I5022 Chronic systolic (congestive) heart failure: Secondary | ICD-10-CM

## 2015-10-23 NOTE — Patient Instructions (Signed)
Medication Instructions:  Your physician recommends that you continue on your current medications as directed. Please refer to the Current Medication list given to you today.   Labwork: None ordered   Testing/Procedures: None ordered   Follow-Up: Your physician wants you to follow-up in: 12 months with Dr Taylor You will receive a reminder letter in the mail two months in advance. If you don't receive a letter, please call our office to schedule the follow-up appointment.  Remote monitoring is used to monitor your  ICD from home. This monitoring reduces the number of office visits required to check your device to one time per year. It allows us to keep an eye on the functioning of your device to ensure it is working properly. You are scheduled for a device check from home on 01/22/16. You may send your transmission at any time that day. If you have a wireless device, the transmission will be sent automatically. After your physician reviews your transmission, you will receive a postcard with your next transmission date.     Any Other Special Instructions Will Be Listed Below (If Applicable).     If you need a refill on your cardiac medications before your next appointment, please call your pharmacy.   

## 2015-10-23 NOTE — Progress Notes (Signed)
HPI [name] returns today for followup. He is a very pleasant 70 year old man with an ischemic cardiomyopathy, chronic systolic heart failure, status post ventricular fibrillation arrest, status post ICD implantation. He also is a history of hypertension. The patient has been stable over the last several months. He denies syncope, chest pain, peripheral edema, or ICD shock. 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 He feels well.  No Known Allergies   Current Outpatient Prescriptions  Medication Sig Dispense Refill  . aspirin (ASPIR-81) 81 MG EC tablet Take 81 mg by mouth daily.      . carvedilol (COREG) 25 MG tablet Take 1 tablet (25 mg total) by mouth 2 (two) times daily. 60 tablet 11  . digoxin (LANOXIN) 0.125 MG tablet TAKE 1 TABLET (0.125 MG TOTAL) BY MOUTH DAILY. 30 tablet 11  . gabapentin (NEURONTIN) 300 MG capsule Take 1 capsule (300 mg total) by mouth 3 (three) times daily. 90 capsule 11  . hydrochlorothiazide (HYDRODIURIL) 25 MG tablet Take 1 tablet (25 mg total) by mouth daily. 30 tablet 11  . lisinopril (PRINIVIL,ZESTRIL) 40 MG tablet Take 0.5 tablets (20 mg total) by mouth daily. 30 tablet 11  . nitroGLYCERIN (NITROSTAT) 0.4 MG SL tablet Place 0.4 mg under the tongue every 5 (five) minutes as needed for chest pain (MAX 3 TABLETS).     Marland Kitchen. simvastatin (ZOCOR) 40 MG tablet Take 1 tablet (40 mg total) by mouth at bedtime. 30 tablet 11  . vitamin B-12 (CYANOCOBALAMIN) 1000 MCG tablet Take 1,000 mcg by mouth daily.     No current facility-administered medications for this visit.     Past Medical History  Diagnosis Date  . Other and unspecified hyperlipidemia   . Other specified forms of chronic ischemic heart disease     w sever residual LV dysfunc., LVEF less than 30%  . Acute myocardial infarction of other anterior wall, initial episode of care   . Coronary atherosclerosis of native coronary artery     status post anterior MI 2008 w V. fib arrest  . Ventricular fibrillation  (HCC)     implantable cardiac defib ?  . Macular degeneration   . Dyslipidemia   . Hypertension   . Other vitamin B12 deficiency anemia 08/14/2013  . Polyneuropathy in other diseases classified elsewhere (HCC) 02/12/2014    ROS:   All systems reviewed and negative except as noted in the HPI.   Past Surgical History  Procedure Laterality Date  . Icd medtronic implanted  07/06/08    Dr. Sharrell KuGreg Taylor  . Cardiac catheterization  09/12/2007  . Cataract extraction Bilateral      Family History  Problem Relation Age of Onset  . Heart attack Father   . Cancer - Colon Sister      Social History   Social History  . Marital Status: Married    Spouse Name: [name] LoserRhonda  . Number of Children: 4  . Years of Education: HS   Occupational History  . Retired    Social History Main Topics  . Smoking status: Former Games developermoker  . Smokeless tobacco: Never Used     Comment: QUIT 09/2007  . Alcohol Use: 4.2 oz/week    7 Standard drinks or equivalent per week     Comment: beer  . Drug Use: No  . Sexual Activity: Not on file   Other Topics Concern  . Not on file   Social History Narrative   Patient lives at home with his wife [name]([name]).   Retired  patient works part Scientific laboratory technician work.   Education high school.   Left handed.   Caffeine one cup of coffee daily.      BP 118/76 mmHg  Pulse 80  Ht  (1.803 m)  Wt 198 lb (89.812 kg)  BMI 27.63 kg/m2 130/82 by me Physical Exam:  Well appearing 70 year old man,NAD HEENT: Unremarkable Neck:  No JVD, no thyromegally Lungs:  Clear with no wheezes, rales, or rhonchi. HEART:  Regular rate rhythm, no murmurs, no rubs, no clicks Abd:  soft, positive bowel sounds, no organomegally, no rebound, no guarding Ext:  2 plus pulses, no edema, no cyanosis, no clubbing Skin:  No rashes no nodules Neuro:  CN II through XII intact, motor grossly intact  DEVICE  Normal device function.  See PaceArt for details.   Assess/Plan: 1. VF arrest - he has  had no recurrent ventricular arrhythmias. No change in meds. 2. Chronic systolic heart failure - his symptoms are class 2A. He will continue his current meds. He is encouraged to maintain a low sodium diet. 3. ICD - his medtronic device is working normally. Will recheck in several months.  Leonia Reeves.D.

## 2015-10-24 LAB — CUP PACEART INCLINIC DEVICE CHECK
Battery Voltage: 2.65 V
Brady Statistic RV Percent Paced: 0 %
Date Time Interrogation Session: 20170322120621
HIGH POWER IMPEDANCE MEASURED VALUE: 46 Ohm
HIGH POWER IMPEDANCE MEASURED VALUE: 46 Ohm
HIGH POWER IMPEDANCE MEASURED VALUE: 47 Ohm
HIGH POWER IMPEDANCE MEASURED VALUE: 48 Ohm
HIGH POWER IMPEDANCE MEASURED VALUE: 48 Ohm
HIGH POWER IMPEDANCE MEASURED VALUE: 48 Ohm
HIGH POWER IMPEDANCE MEASURED VALUE: 49 Ohm
HIGH POWER IMPEDANCE MEASURED VALUE: 59 Ohm
HIGH POWER IMPEDANCE MEASURED VALUE: 60 Ohm
HIGH POWER IMPEDANCE MEASURED VALUE: 60 Ohm
HIGH POWER IMPEDANCE MEASURED VALUE: 60 Ohm
HIGH POWER IMPEDANCE MEASURED VALUE: 60 Ohm
HIGH POWER IMPEDANCE MEASURED VALUE: 61 Ohm
HIGH POWER IMPEDANCE MEASURED VALUE: 62 Ohm
HIGH POWER IMPEDANCE MEASURED VALUE: 62 Ohm
HighPow Impedance: 46 Ohm
HighPow Impedance: 47 Ohm
HighPow Impedance: 47 Ohm
HighPow Impedance: 48 Ohm
HighPow Impedance: 48 Ohm
HighPow Impedance: 48 Ohm
HighPow Impedance: 48 Ohm
HighPow Impedance: 48 Ohm
HighPow Impedance: 51 Ohm
HighPow Impedance: 59 Ohm
HighPow Impedance: 59 Ohm
HighPow Impedance: 59 Ohm
HighPow Impedance: 60 Ohm
HighPow Impedance: 60 Ohm
HighPow Impedance: 68 Ohm
HighPow Impedance: 72 Ohm
Implantable Lead Location: 753860
Lead Channel Impedance Value: 576 Ohm
Lead Channel Impedance Value: 600 Ohm
Lead Channel Impedance Value: 600 Ohm
Lead Channel Impedance Value: 608 Ohm
Lead Channel Impedance Value: 616 Ohm
Lead Channel Impedance Value: 656 Ohm
Lead Channel Pacing Threshold Pulse Width: 0.2 ms
Lead Channel Sensing Intrinsic Amplitude: 10.1 mV
Lead Channel Sensing Intrinsic Amplitude: 10.1 mV
Lead Channel Sensing Intrinsic Amplitude: 10.3 mV
Lead Channel Sensing Intrinsic Amplitude: 10.3 mV
Lead Channel Sensing Intrinsic Amplitude: 10.5 mV
Lead Channel Sensing Intrinsic Amplitude: 10.6 mV
Lead Channel Sensing Intrinsic Amplitude: 10.6 mV
Lead Channel Sensing Intrinsic Amplitude: 9.9 mV
Lead Channel Setting Pacing Amplitude: 2.5 V
Lead Channel Setting Sensing Sensitivity: 0.6 mV
MDC IDC LEAD IMPLANT DT: 20091204
MDC IDC MSMT LEADCHNL RV IMPEDANCE VALUE: 544 Ohm
MDC IDC MSMT LEADCHNL RV IMPEDANCE VALUE: 560 Ohm
MDC IDC MSMT LEADCHNL RV IMPEDANCE VALUE: 576 Ohm
MDC IDC MSMT LEADCHNL RV IMPEDANCE VALUE: 576 Ohm
MDC IDC MSMT LEADCHNL RV IMPEDANCE VALUE: 592 Ohm
MDC IDC MSMT LEADCHNL RV IMPEDANCE VALUE: 600 Ohm
MDC IDC MSMT LEADCHNL RV IMPEDANCE VALUE: 608 Ohm
MDC IDC MSMT LEADCHNL RV IMPEDANCE VALUE: 616 Ohm
MDC IDC MSMT LEADCHNL RV IMPEDANCE VALUE: 632 Ohm
MDC IDC MSMT LEADCHNL RV PACING THRESHOLD AMPLITUDE: 1 V
MDC IDC MSMT LEADCHNL RV SENSING INTR AMPL: 10.5 mV
MDC IDC MSMT LEADCHNL RV SENSING INTR AMPL: 10.6 mV
MDC IDC MSMT LEADCHNL RV SENSING INTR AMPL: 10.8 mV
MDC IDC MSMT LEADCHNL RV SENSING INTR AMPL: 11.5 mV
MDC IDC MSMT LEADCHNL RV SENSING INTR AMPL: 11.5 mV
MDC IDC MSMT LEADCHNL RV SENSING INTR AMPL: 9.3 mV
MDC IDC MSMT LEADCHNL RV SENSING INTR AMPL: 9.8 mV
MDC IDC SET LEADCHNL RV PACING PULSEWIDTH: 0.4 ms

## 2015-10-25 ENCOUNTER — Telehealth: Payer: Self-pay | Admitting: Neurology

## 2015-10-25 MED ORDER — GABAPENTIN 300 MG PO CAPS: ORAL_CAPSULE | ORAL | Status: DC

## 2015-10-25 NOTE — Telephone Encounter (Signed)
I called patient, talk with the wife. The patient is having increasing pain in the evening hours. Will go up on the gabapentin taking 300 mg twice during the day, 600 milligrams at night.

## 2015-10-25 NOTE — Telephone Encounter (Signed)
Pt's wife called said pt was up all night hurting in left foot and leg. Said it was throbbing and shooting pain. He said this is the 1st time it was a constant pain all night that kept him sleeping. He is wanting gabapentin increased if possible.

## 2015-11-26 ENCOUNTER — Other Ambulatory Visit: Payer: Self-pay | Admitting: Cardiovascular Disease

## 2015-12-02 ENCOUNTER — Telehealth: Payer: Self-pay | Admitting: Cardiovascular Disease

## 2015-12-02 NOTE — Telephone Encounter (Signed)
New message    Wife calling husband does not have a PCP . Wife PCP will be seeing him on  6.10.2017 . Physical not until July.   Wife wanted to know next available for Dr. Excell Seltzerooper  - August.    C/O tried. Not sleeping good at night .    Wife stated no chest pain, no sob.

## 2015-12-02 NOTE — Telephone Encounter (Signed)
agree

## 2015-12-02 NOTE — Telephone Encounter (Signed)
I spoke with the pt's wife and she has the pt scheduled to see a PCP on 12/11/15.  She said the pt is tired all the time, complains of stomach pain, constipation and not sleeping well.  In general he does not feel well.  She said at times he has 24 hour periods of BP running in the 80-90 range and he does not take his medication. On average his SBP is 120.  She is unsure of pulse.  I advised her to keep a log of BP and pulse and we can adjust medications if these readings are low.  She agreed with plan and will have pt see PCP next week.

## 2015-12-03 ENCOUNTER — Telehealth: Payer: Self-pay | Admitting: Neurology

## 2015-12-03 NOTE — Telephone Encounter (Signed)
Pt's wife called to r/s his appt on 02/20/16-they will be out of town on vacation. His last OV with Megan she said he will f/u with Dr Anne HahnWillis in 6 mths  Providers 1st available is 06/01/16. Can this pt see Aundra MilletMegan again? Pt's wife sts she does see some progression of inability to walk

## 2015-12-03 NOTE — Telephone Encounter (Signed)
Returned call to pt's wife. [facility]

## 2015-12-11 ENCOUNTER — Ambulatory Visit: Payer: Medicare Other | Admitting: Family Medicine

## 2016-01-14 ENCOUNTER — Ambulatory Visit: Payer: Medicare Other | Admitting: Cardiovascular Disease

## 2016-01-22 ENCOUNTER — Ambulatory Visit (INDEPENDENT_AMBULATORY_CARE_PROVIDER_SITE_OTHER): Payer: Medicare Other | Admitting: *Deleted

## 2016-01-22 ENCOUNTER — Telehealth: Payer: Self-pay | Admitting: Cardiology

## 2016-01-22 DIAGNOSIS — I255 Ischemic cardiomyopathy: Secondary | ICD-10-CM

## 2016-01-22 NOTE — Telephone Encounter (Signed)
LMOVM reminding pt to send remote transmission.   

## 2016-01-22 NOTE — Progress Notes (Signed)
Remote ICD transmission.   

## 2016-01-23 LAB — CUP PACEART REMOTE DEVICE CHECK
HIGH POWER IMPEDANCE MEASURED VALUE: 47 Ohm
Implantable Lead Implant Date: 20091204
Implantable Lead Location: 753860
Lead Channel Impedance Value: 544 Ohm
Lead Channel Setting Pacing Amplitude: 2.5 V
Lead Channel Setting Pacing Pulse Width: 0.4 ms
Lead Channel Setting Sensing Sensitivity: 0.6 mV
MDC IDC MSMT BATTERY VOLTAGE: 2.65 V
MDC IDC MSMT LEADCHNL RV SENSING INTR AMPL: 10.5 mV
MDC IDC SESS DTM: 20170621175400

## 2016-01-24 ENCOUNTER — Encounter: Payer: Self-pay | Admitting: Cardiology

## 2016-02-03 ENCOUNTER — Ambulatory Visit (INDEPENDENT_AMBULATORY_CARE_PROVIDER_SITE_OTHER): Payer: Medicare Other | Admitting: Neurology

## 2016-02-03 ENCOUNTER — Encounter: Payer: Self-pay | Admitting: Neurology

## 2016-02-03 VITALS — BP 140/78 | HR 64 | Ht 71.0 in | Wt 193.5 lb

## 2016-02-03 DIAGNOSIS — G250 Essential tremor: Secondary | ICD-10-CM | POA: Diagnosis not present

## 2016-02-03 DIAGNOSIS — G63 Polyneuropathy in diseases classified elsewhere: Secondary | ICD-10-CM

## 2016-02-03 HISTORY — DX: Essential tremor: G25.0

## 2016-02-03 NOTE — Patient Instructions (Signed)

## 2016-02-03 NOTE — Progress Notes (Signed)
Reason for visit: Peripheral neuropathy  Boneta LucksHarvey R Spanos is an 70 y.o. male  History of present illness:  Mr. Talbot GrumblingWoodell is a 70 year old left-handed white male with a history of a peripheral neuropathy. He has a prominent family history of neuropathy with 2 sisters with similar issues. His brother died yesterday with liver disease. Two of the sisters also have vitamin B12 deficiency as the patient does. The patient is on oral supplementation with his vitamin B12. He indicates that the pain is under relatively good control, he does have some tingling in the fingers of the hands bilaterally at times. 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 The patient denies any severe balance issues, he has not had any falls. He will alter his dose, usually he will take 3 gabapentin daily, some days he may take 4 if the pain worsens. He does not use a cane for ambulation. The patient also has a tremor involving the head and neck, and hands.    Past Medical History  Diagnosis Date  . Other and unspecified hyperlipidemia   . Other specified forms of chronic ischemic heart disease     w sever residual LV dysfunc., LVEF less than 30%  . Acute myocardial infarction of other anterior wall, initial episode of care   . Coronary atherosclerosis of native coronary artery     status post anterior MI 2008 w V. fib arrest  . Ventricular fibrillation (HCC)     implantable cardiac defib ?  . Macular degeneration   . Dyslipidemia   . Hypertension   . Other vitamin B12 deficiency anemia 08/14/2013  . Polyneuropathy in other diseases classified elsewhere (HCC) 02/12/2014  . Tremor, essential 02/03/2016    Past Surgical History  Procedure Laterality Date  . Icd medtronic implanted  07/06/08    Dr. Sharrell KuGreg Taylor  . Cardiac catheterization  09/12/2007  . Cataract extraction Bilateral     Family History  Problem  Relation Age of Onset  . Heart attack Father   . Cancer - Colon Sister   . Liver disease Brother   . Neuropathy Sister   . Neuropathy Sister     Social history:  reports that he has quit smoking. He has never used smokeless tobacco. He reports that he drinks about 4.2 oz of alcohol per week. He reports that he does not use illicit drugs.   No Known Allergies  Medications:  Prior to Admission medications   Medication Sig Start Date End Date Taking? Authorizing Provider  aspirin (ASPIR-81) 81 MG EC tablet Take 81 mg by mouth daily.      Historical Provider, MD  carvedilol (COREG) 25 MG tablet Take 1 tablet (25 mg total) by mouth 2 (two) times daily. 06/20/15   Tonny BollmanMichael Cooper, MD  digoxin (LANOXIN) 0.125 MG tablet TAKE 1 TABLET (0.125 MG TOTAL) BY MOUTH DAILY. 06/20/15   Tonny BollmanMichael Cooper, MD  gabapentin (NEURONTIN) 300 MG capsule One capsule in the morning, and at midday, 2 capsules at night 10/25/15   York Spanielharles K Darion Juhasz, MD  hydrochlorothiazide (HYDRODIURIL) 25 MG tablet Take 1 tablet (25 mg total) by mouth daily. 06/20/15   Tonny BollmanMichael Cooper, MD  hydrochlorothiazide (HYDRODIURIL) 25 MG tablet TAKE 1 TABLET (25 MG TOTAL) BY MOUTH DAILY. 11/26/15   Tonny BollmanMichael Cooper, MD  lisinopril (PRINIVIL,ZESTRIL) 40 MG tablet Take 0.5 tablets (20 mg total) by mouth daily.  06/25/15   Tonny BollmanMichael Cooper, MD  nitroGLYCERIN (NITROSTAT) 0.4 MG SL tablet Place 0.4 mg under the tongue every 5 (five) minutes as needed for chest pain (MAX 3 TABLETS).     Historical Provider, MD  simvastatin (ZOCOR) 40 MG tablet Take 1 tablet (40 mg total) by mouth at bedtime. 06/20/15   Tonny BollmanMichael Cooper, MD  vitamin B-12 (CYANOCOBALAMIN) 1000 MCG tablet Take 1,000 mcg by mouth daily.    Historical Provider, MD    ROS:  Out of a complete 14 system review of symptoms, the patient complains only of the following symptoms, and all other reviewed systems are negative.  Numbness, tingling  Blood pressure 140/78, pulse 64, height 5\' 11"  (1.803 m),  weight 193 lb 8 oz (87.771 kg).  Physical Exam  General: The patient is alert and cooperative at the time of the examination.  Skin: No significant peripheral edema is noted.   Neurologic Exam  Mental status: The patient is alert and oriented x 3 at the time of the examination. The patient has apparent normal recent and remote memory, with an apparently normal attention span and concentration ability.   Cranial nerves: Facial symmetry is present. Speech is normal, no aphasia or dysarthria is noted. Extraocular movements are full. Visual fields are full. A side-to-side head and neck tremor is noted.  Motor: The patient has good strength in all 4 extremities.  Sensory examination: Soft touch sensation is symmetric on the face, arms, and legs.  Coordination: The patient has good finger-nose-finger and heel-to-shin bilaterally.The patient has an intention tremor with finger-nose-finger bilaterally.  Gait and station: The patient has a normal gait. Tandem gait is minimally unsteady. Romberg is negative. No drift is seen.  Reflexes: Deep tendon reflexes are symmetric, But are depressed.   Assessment/Plan:  1. Peripheral neuropathy   2. Essential tremor   3. Mild gait disturbance   The patient is doing relatively well with pain management of his peripheral neuropathy. The gabapentin may also be helping the tremor somewhat. The patient will continue taking [phone] mg of gabapentin daily. He will follow-up in 6 months, sooner if needed. He is remaining relatively safe. I have asked him to avoid working at heights. He should have a vitamin B12 level checked annually. Two of his sisters are on B12 shots. The patient inquires about taking B complex vitamin supplementation, and alpha lipoic acid, I think that this is reasonable.   Marlan Palau. Keith Ronnisha Felber MD 02/03/2016 9:02 AM  [facility] [address] [place], [place] [id number]  Phone [phone] Fax  [phone]

## 2016-02-20 ENCOUNTER — Ambulatory Visit: Payer: Medicare Other | Admitting: Neurology

## 2016-02-25 ENCOUNTER — Ambulatory Visit: Payer: Medicare Other | Admitting: Family Medicine

## 2016-03-19 ENCOUNTER — Encounter: Payer: Self-pay | Admitting: Cardiovascular Disease

## 2016-03-23 ENCOUNTER — Encounter: Payer: Self-pay | Admitting: Cardiovascular Disease

## 2016-03-24 ENCOUNTER — Encounter: Payer: Self-pay | Admitting: Cardiovascular Disease

## 2016-03-24 ENCOUNTER — Ambulatory Visit (INDEPENDENT_AMBULATORY_CARE_PROVIDER_SITE_OTHER): Payer: Medicare Other | Admitting: Cardiovascular Disease

## 2016-03-24 ENCOUNTER — Encounter (INDEPENDENT_AMBULATORY_CARE_PROVIDER_SITE_OTHER): Payer: Self-pay

## 2016-03-24 VITALS — BP 124/60 | HR 72 | Ht 71.0 in | Wt 202.8 lb

## 2016-03-24 DIAGNOSIS — I255 Ischemic cardiomyopathy: Secondary | ICD-10-CM | POA: Diagnosis not present

## 2016-03-24 DIAGNOSIS — I25119 Atherosclerotic heart disease of native coronary artery with unspecified angina pectoris: Secondary | ICD-10-CM

## 2016-03-24 DIAGNOSIS — I10 Essential (primary) hypertension: Secondary | ICD-10-CM | POA: Diagnosis not present

## 2016-03-24 LAB — BASIC METABOLIC PANEL
BUN: 19 mg/dL (ref 7–25)
CALCIUM: 9 mg/dL (ref 8.6–10.3)
CO2: 22 mmol/L (ref 20–31)
Chloride: 106 mmol/L (ref 98–110)
Creat: 1.47 mg/dL — ABNORMAL HIGH (ref 0.70–1.25)
Glucose, Bld: 88 mg/dL (ref 65–99)
Potassium: 4.7 mmol/L (ref 3.5–5.3)
SODIUM: 138 mmol/L (ref 135–146)

## 2016-03-24 LAB — PROTIME-INR
INR: 1
PROTHROMBIN TIME: 11.1 s (ref 9.0–11.5)

## 2016-03-24 LAB — CBC
HCT: 41.9 % (ref 38.5–50.0)
HEMOGLOBIN: 14 g/dL (ref 13.2–17.1)
MCH: 31.2 pg (ref 27.0–33.0)
MCHC: 33.4 g/dL (ref 32.0–36.0)
MCV: 93.3 fL (ref 80.0–100.0)
MPV: 9.3 fL (ref 7.5–12.5)
Platelets: 213 10*3/uL (ref 140–400)
RBC: 4.49 MIL/uL (ref 4.20–5.80)
RDW: 13 % (ref 11.0–15.0)
WBC: 7.3 10*3/uL (ref 3.8–10.8)

## 2016-03-24 MED ORDER — ISOSORBIDE MONONITRATE ER 30 MG PO TB24: 15.0000 mg | ORAL_TABLET | Freq: Every day | ORAL | 11 refills | Status: DC

## 2016-03-24 MED ORDER — PANTOPRAZOLE SODIUM 40 MG PO TBEC: 40.0000 mg | DELAYED_RELEASE_TABLET | Freq: Every day | ORAL | 11 refills | Status: DC

## 2016-03-24 NOTE — Patient Instructions (Signed)
Medication Instructions:  Your physician has recommended you make the following change in your medication:  1. START Pantoprazole 40mg  take one tablet by mouth daily 2. START Isosorbide MN 30mg  take one-half tablet by mouth daily  Labwork: Your physician recommends that you have lab work today: BMP, CBC and PT/INR  Testing/Procedures: Your physician has requested that you have a cardiac catheterization. Cardiac catheterization is used to diagnose and/or treat various heart conditions. Doctors may recommend this procedure for a number of different reasons. The most common reason is to evaluate chest pain. Chest pain can be a symptom of coronary artery disease (CAD), and cardiac catheterization can show whether plaque is narrowing or blocking your heart's arteries. This procedure is also used to evaluate the valves, as well as measure the blood flow and oxygen levels in different parts of your heart. For further information please visit [email or web address] Please follow instruction sheet, as given.  Follow-Up: We will arrange further follow-up after cardiac catheterization.   Any Other Special Instructions Will Be Listed Below (If Applicable).     If you need a refill on your cardiac medications before your next appointment, please call your pharmacy.

## 2016-03-24 NOTE — Progress Notes (Signed)
Cardiology Office Note Date:  03/25/2016   ID:  [name], DOB 02/07/1946, MRN [id number]  PCP:  Pcp Not In System  Cardiologist:  Tonny Bollman, MD    Chief Complaint  [name] presents with  . Coronary Artery Disease  . Shortness of Breath   History of Present Illness: [name] is a 70 y.o. male who presents for follow-up evaluation. He initially presented in 2009 with an anterior MI complicated by cardiac arrest. He underwent primary PCI of [name] proximal LAD. He has ultimately undergone ICD placement for severe ischemic cardiomyopathy. Since that time he's been followed for chronic systolic heart failure and has largely done well from a cardiac perspective.   He's here with his wife today. He hasn't been doing well of late. Over [name] past few weeks he has developed episodes of chest pressure, shortness of breath, and lots of belching. He has developed bendopnea as well. Chest pressure is nonexertional, but shortness of breath occurs both at rest and with activity. He 'gives out' with minimal activity. Denies any recent change in eating habits, but has gained weight.  Chest pressure is in center of chest, nonradiating. He also has had episodes of neck and shoulder pain associated with drops in his blood pressure. This is relieved when he sits down to rest.    Past Medical History:  Diagnosis Date  . Acute myocardial infarction of other anterior wall, initial episode of care   . Coronary atherosclerosis of native coronary artery    status post anterior MI 2008 w V. fib arrest  . Dyslipidemia   . Hypertension   . Macular degeneration   . Other and unspecified hyperlipidemia   . Other specified forms of chronic ischemic heart disease    w sever residual LV dysfunc., LVEF less than 30%  . Other vitamin B12 deficiency anemia 08/14/2013  . Polyneuropathy in other diseases classified elsewhere (HCC) 02/12/2014  . Tremor, essential 02/03/2016  . Ventricular fibrillation (HCC)      implantable cardiac defib ?    Past Surgical History:  Procedure Laterality Date  . CARDIAC CATHETERIZATION  09/12/2007  . CATARACT EXTRACTION Bilateral   . ICD medtronic implanted  07/06/08   Dr. Sharrell Ku    Current Outpatient Prescriptions  Medication Sig Dispense Refill  . aspirin (ASPIR-81) 81 MG EC tablet Take 81 mg by mouth daily.      . carvedilol (COREG) 25 MG tablet Take 1 tablet (25 mg total) by mouth 2 (two) times daily. 60 tablet 11  . digoxin (LANOXIN) 0.125 MG tablet TAKE 1 TABLET (0.125 MG TOTAL) BY MOUTH DAILY. 30 tablet 11  . gabapentin (NEURONTIN) 300 MG capsule One capsule in [name] morning, and at midday, 2 capsules at night 120 capsule 11  . hydrochlorothiazide (HYDRODIURIL) 25 MG tablet Take 1 tablet (25 mg total) by mouth daily. 30 tablet 11  . lisinopril (PRINIVIL,ZESTRIL) 40 MG tablet Take 0.5 tablets (20 mg total) by mouth daily. 30 tablet 11  . nitroGLYCERIN (NITROSTAT) 0.4 MG SL tablet Place 0.4 mg under [name] tongue every 5 (five) minutes as needed for chest pain (MAX 3 TABLETS).     Marland Kitchen simvastatin (ZOCOR) 40 MG tablet Take 1 tablet (40 mg total) by mouth at bedtime. 30 tablet 11  . vitamin B-12 (CYANOCOBALAMIN) 1000 MCG tablet Take 1,000 mcg by mouth daily.    . isosorbide mononitrate (IMDUR) 30 MG 24 hr tablet Take 0.5 tablets (15 mg total) by mouth daily. 30 tablet 11  .  pantoprazole (PROTONIX) 40 MG tablet Take 1 tablet (40 mg total) by mouth daily. 30 tablet 11   No current facility-administered medications for this visit.     Allergies:   Review of [name]'s allergies indicates no known allergies.   Social History:  [name]  reports that he has quit smoking. He has never used smokeless tobacco. He reports that he drinks about 4.2 oz of alcohol per week . He reports that he does not use drugs.   Family History:  [name]'s  family history includes Cancer - Colon in his sister; Heart attack in his father; Liver disease in his brother; Neuropathy in  his sister and sister.    ROS:  Please see [name] history of present illness.  Otherwise, review of systems is positive for neuropathic pain in [name] feet, PND, orthopnea, cough, DOE, fatigue, snoring, balance problems.  All other systems are reviewed and negative.    PHYSICAL EXAM: VS:  BP 124/60   Pulse 72   Ht 5\' 11"  (1.803 m)   Wt 92 kg (202 lb 12.8 oz)   BMI 28.28 kg/m  , BMI Body mass index is 28.28 kg/m. GEN: Well nourished, well developed, in no acute distress  HEENT: normal  Neck: no JVD, no masses. No carotid bruits Cardiac: RRR without murmur or gallop                Respiratory:  clear to auscultation bilaterally, normal work of breathing GI: soft, nontender, nondistended, + BS MS: no deformity or atrophy  Ext: no pretibial edema, pedal pulses 2+= bilaterally Skin: warm and dry, no rash Neuro:  Strength and sensation are intact Psych: euthymic mood, full affect  EKG:  EKG is ordered today. [name] ekg ordered today shows NSR, nonspecific IVCD, St and T wave changes consider lateral ischemia  Recent Labs: 06/24/2015: ALT 29 03/24/2016: BUN 19; Creat 1.47; Hemoglobin 14.0; Platelets 213; Potassium 4.7; Sodium 138   Lipid Panel     Component Value Date/Time   CHOL 133 06/24/2015 0748   TRIG 155 (H) 06/24/2015 0748   HDL 41 06/24/2015 0748   CHOLHDL 3.2 06/24/2015 0748   VLDL 31 (H) 06/24/2015 0748   LDLCALC 61 06/24/2015 0748      Wt Readings from Last 3 Encounters:  03/24/16 92 kg (202 lb 12.8 oz)  02/03/16 87.8 kg (193 lb 8 oz)  10/23/15 89.8 kg (198 lb)     ASSESSMENT AND PLAN: 1.  Chest pain at rest: symptoms concerning for ischemic chest pain in [name] with known CAD. He reports a significant change in symptoms over [name] past few weeks. With his history of large anterior infarct I don't think noninvasive testing will provide an accurate diagnosis. I've recommended cardiac catheterization and possible PCI. I have reviewed [name] risks, indications, and alternatives  to cardiac catheterization, possible angioplasty, and stenting with [name]. Risks include but are not limited to bleeding, infection, vascular injury, stroke, myocardial infection, arrhythmia, kidney injury, radiation-related injury in [name] case of prolonged fluoroscopy use, emergency cardiac surgery, and death. [name] understands [name] risks of serious complication is 1-2 in 1000 with diagnostic cardiac cath and 1-2% or less with angioplasty/stenting.   2. Acute on chronic systolic heart failure: NYHA IIIB. Will perform right heart cath at [name] time of his procedure. Continue current medical therapy for now.   3. Hyperlipidemia: continue statin. Most recent lipids reviewed. Dietary counseling done.   4. HTN: BP is in range today but has been elevated  at times. Continue same Rx.   Current medicines are reviewed with [name] today.  [name] does not have concerns regarding medicines.  Labs/ tests ordered today include:   Orders Placed This Encounter  Procedures  . Basic metabolic panel  . CBC  . INR/PT  . EKG 12-Lead    Disposition:   FU after cath  Signed, Tonny Bollmanooper, Janay Canan, MD  03/25/2016 10:33 PM    [facility] [address], [place], [place]  [address] Phone: [phone](336) [phone]; Fax: [phone]) [phone]

## 2016-03-25 ENCOUNTER — Telehealth: Payer: Self-pay | Admitting: Cardiovascular Disease

## 2016-03-25 NOTE — Telephone Encounter (Signed)
New message  Pt wife verbalized that she is calling to speak to the rn about the procedure that pt has scheduled for 03-26-16  He has had a lot of problems with his teeth hurting and he has not been told that he has an infection.

## 2016-03-25 NOTE — Telephone Encounter (Signed)
The pt's wife said the pt has been having issues with his teeth for a while now and he just wanted to make Dr Excell Seltzerooper prior to cardiac catheterization. She said the pt has not been diagnosed with an infection or seen a dentist.  I made her aware that the pt needs to continue with plans for procedure tomorrow.

## 2016-03-26 ENCOUNTER — Ambulatory Visit: Payer: Medicare Other | Admitting: Cardiovascular Disease

## 2016-03-26 ENCOUNTER — Encounter (HOSPITAL_COMMUNITY)
Admission: RE | Disposition: A | Discharge status: Home / Self Care | Payer: Self-pay | Source: Ambulatory Visit | Attending: Cardiovascular Disease

## 2016-03-26 ENCOUNTER — Other Ambulatory Visit: Payer: Self-pay | Admitting: Cardiovascular Disease

## 2016-03-26 ENCOUNTER — Ambulatory Visit (HOSPITAL_COMMUNITY)
Admission: RE | Admit: 2016-03-26 | Discharge: 2016-03-26 | Disposition: A | Discharge status: Home / Self Care | Payer: Medicare Other | Source: Ambulatory Visit | Attending: Cardiovascular Disease | Admitting: Cardiovascular Disease

## 2016-03-26 DIAGNOSIS — Z7982 Long term (current) use of aspirin: Secondary | ICD-10-CM | POA: Diagnosis not present

## 2016-03-26 DIAGNOSIS — I5023 Acute on chronic systolic (congestive) heart failure: Secondary | ICD-10-CM | POA: Diagnosis not present

## 2016-03-26 DIAGNOSIS — D519 Vitamin B12 deficiency anemia, unspecified: Secondary | ICD-10-CM | POA: Diagnosis not present

## 2016-03-26 DIAGNOSIS — I110 Hypertensive heart disease with heart failure: Secondary | ICD-10-CM | POA: Diagnosis not present

## 2016-03-26 DIAGNOSIS — R079 Chest pain, unspecified: Secondary | ICD-10-CM | POA: Diagnosis present

## 2016-03-26 DIAGNOSIS — I25119 Atherosclerotic heart disease of native coronary artery with unspecified angina pectoris: Secondary | ICD-10-CM

## 2016-03-26 DIAGNOSIS — I252 Old myocardial infarction: Secondary | ICD-10-CM | POA: Insufficient documentation

## 2016-03-26 DIAGNOSIS — H3530 Unspecified macular degeneration: Secondary | ICD-10-CM | POA: Insufficient documentation

## 2016-03-26 DIAGNOSIS — Z8249 Family history of ischemic heart disease and other diseases of the circulatory system: Secondary | ICD-10-CM | POA: Diagnosis not present

## 2016-03-26 DIAGNOSIS — I272 Other secondary pulmonary hypertension: Secondary | ICD-10-CM | POA: Insufficient documentation

## 2016-03-26 DIAGNOSIS — I4901 Ventricular fibrillation: Secondary | ICD-10-CM | POA: Diagnosis not present

## 2016-03-26 DIAGNOSIS — E785 Hyperlipidemia, unspecified: Secondary | ICD-10-CM | POA: Diagnosis not present

## 2016-03-26 DIAGNOSIS — I255 Ischemic cardiomyopathy: Secondary | ICD-10-CM | POA: Insufficient documentation

## 2016-03-26 DIAGNOSIS — G250 Essential tremor: Secondary | ICD-10-CM | POA: Diagnosis not present

## 2016-03-26 DIAGNOSIS — Z955 Presence of coronary angioplasty implant and graft: Secondary | ICD-10-CM | POA: Insufficient documentation

## 2016-03-26 DIAGNOSIS — Z95810 Presence of automatic (implantable) cardiac defibrillator: Secondary | ICD-10-CM | POA: Insufficient documentation

## 2016-03-26 DIAGNOSIS — G629 Polyneuropathy, unspecified: Secondary | ICD-10-CM | POA: Insufficient documentation

## 2016-03-26 DIAGNOSIS — Z87891 Personal history of nicotine dependence: Secondary | ICD-10-CM | POA: Insufficient documentation

## 2016-03-26 DIAGNOSIS — I2510 Atherosclerotic heart disease of native coronary artery without angina pectoris: Secondary | ICD-10-CM | POA: Diagnosis not present

## 2016-03-26 DIAGNOSIS — Z800 Family history of malignant neoplasm of digestive organs: Secondary | ICD-10-CM | POA: Insufficient documentation

## 2016-03-26 DIAGNOSIS — I10 Essential (primary) hypertension: Secondary | ICD-10-CM

## 2016-03-26 HISTORY — PX: CARDIAC CATHETERIZATION: SHX172

## 2016-03-26 LAB — POCT I-STAT 3, VENOUS BLOOD GAS (G3P V)
ACID-BASE DEFICIT: 3 mmol/L — AB (ref 0.0–2.0)
ACID-BASE DEFICIT: 4 mmol/L — AB (ref 0.0–2.0)
BICARBONATE: 22.1 meq/L (ref 20.0–24.0)
Bicarbonate: 22 mEq/L (ref 20.0–24.0)
O2 SAT: 63 %
O2 SAT: 64 %
PCO2 VEN: 42.6 mmHg — AB (ref 45.0–50.0)
PO2 VEN: 35 mmHg (ref 31.0–45.0)
PO2 VEN: 36 mmHg (ref 31.0–45.0)
TCO2: 23 mmol/L (ref 0–100)
TCO2: 23 mmol/L (ref 0–100)
pCO2, Ven: 40.1 mmHg — ABNORMAL LOW (ref 45.0–50.0)
pH, Ven: 7.322 — ABNORMAL HIGH (ref 7.250–7.300)
pH, Ven: 7.349 — ABNORMAL HIGH (ref 7.250–7.300)

## 2016-03-26 LAB — POCT I-STAT 3, ART BLOOD GAS (G3+)
ACID-BASE DEFICIT: 5 mmol/L — AB (ref 0.0–2.0)
Bicarbonate: 20.1 mEq/L (ref 20.0–24.0)
O2 SAT: 92 %
TCO2: 21 mmol/L (ref 0–100)
pCO2 arterial: 36.7 mmHg (ref 35.0–45.0)
pH, Arterial: 7.346 — ABNORMAL LOW (ref 7.350–7.450)
pO2, Arterial: 66 mmHg — ABNORMAL LOW (ref 80.0–100.0)

## 2016-03-26 SURGERY — RIGHT/LEFT HEART CATH AND CORONARY ANGIOGRAPHY
Anesthesia: LOCAL

## 2016-03-26 MED ORDER — HEPARIN SODIUM (PORCINE) 1000 UNIT/ML IJ SOLN
INTRAMUSCULAR | Status: AC
  Filled 2016-03-26: qty 1

## 2016-03-26 MED ORDER — SODIUM CHLORIDE 0.9 % IV SOLN: INTRAVENOUS | Status: DC

## 2016-03-26 MED ORDER — ACETAMINOPHEN 325 MG PO TABS: 650.0000 mg | ORAL_TABLET | ORAL | Status: DC | PRN

## 2016-03-26 MED ORDER — SODIUM CHLORIDE 0.9 % IV SOLN
INTRAVENOUS | Status: DC
  Administered 2016-03-26: 12:00:00 via INTRAVENOUS

## 2016-03-26 MED ORDER — LIDOCAINE HCL (PF) 1 % IJ SOLN
INTRAMUSCULAR | Status: DC | PRN
  Administered 2016-03-26 (×2): 2 mL

## 2016-03-26 MED ORDER — SODIUM CHLORIDE 0.9 % IV SOLN: 250.0000 mL | INTRAVENOUS | Status: DC | PRN

## 2016-03-26 MED ORDER — VERAPAMIL HCL 2.5 MG/ML IV SOLN
INTRAVENOUS | Status: DC | PRN
  Administered 2016-03-26: 10 mL via INTRA_ARTERIAL

## 2016-03-26 MED ORDER — SODIUM CHLORIDE 0.9% FLUSH: 3.0000 mL | Freq: Two times a day (BID) | INTRAVENOUS | Status: DC

## 2016-03-26 MED ORDER — SODIUM CHLORIDE 0.9% FLUSH: 3.0000 mL | INTRAVENOUS | Status: DC | PRN

## 2016-03-26 MED ORDER — HEPARIN SODIUM (PORCINE) 1000 UNIT/ML IJ SOLN
INTRAMUSCULAR | Status: DC | PRN
  Administered 2016-03-26: 4500 [IU] via INTRAVENOUS

## 2016-03-26 MED ORDER — LIDOCAINE HCL (PF) 1 % IJ SOLN
INTRAMUSCULAR | Status: AC
  Filled 2016-03-26: qty 30

## 2016-03-26 MED ORDER — FENTANYL CITRATE (PF) 100 MCG/2ML IJ SOLN
INTRAMUSCULAR | Status: AC
  Filled 2016-03-26: qty 2

## 2016-03-26 MED ORDER — MIDAZOLAM HCL 2 MG/2ML IJ SOLN
INTRAMUSCULAR | Status: AC
  Filled 2016-03-26: qty 2

## 2016-03-26 MED ORDER — ISOSORBIDE MONONITRATE ER 30 MG PO TB24: 30.0000 mg | ORAL_TABLET | Freq: Every day | ORAL | 11 refills | Status: DC

## 2016-03-26 MED ORDER — IOPAMIDOL (ISOVUE-370) INJECTION 76%
INTRAVENOUS | Status: AC
  Filled 2016-03-26: qty 100

## 2016-03-26 MED ORDER — FENTANYL CITRATE (PF) 100 MCG/2ML IJ SOLN
INTRAMUSCULAR | Status: DC | PRN
  Administered 2016-03-26: 50 ug via INTRAVENOUS

## 2016-03-26 MED ORDER — MIDAZOLAM HCL 2 MG/2ML IJ SOLN
INTRAMUSCULAR | Status: DC | PRN
  Administered 2016-03-26 (×2): 1 mg via INTRAVENOUS

## 2016-03-26 MED ORDER — HEPARIN (PORCINE) IN NACL 2-0.9 UNIT/ML-% IJ SOLN
INTRAMUSCULAR | Status: DC | PRN
  Administered 2016-03-26: 1000 mL

## 2016-03-26 MED ORDER — IOPAMIDOL (ISOVUE-370) INJECTION 76%
INTRAVENOUS | Status: DC | PRN
Start: 2016-03-26 — End: 2016-03-26
  Administered 2016-03-26: 45 mL via INTRAVENOUS

## 2016-03-26 MED ORDER — ASPIRIN 81 MG PO CHEW: 81.0000 mg | CHEWABLE_TABLET | ORAL | Status: DC

## 2016-03-26 MED ORDER — HEPARIN (PORCINE) IN NACL 2-0.9 UNIT/ML-% IJ SOLN
INTRAMUSCULAR | Status: AC
  Filled 2016-03-26: qty 1500

## 2016-03-26 MED ORDER — FUROSEMIDE 40 MG PO TABS: 40.0000 mg | ORAL_TABLET | Freq: Every day | ORAL | 5 refills | Status: DC

## 2016-03-26 MED ORDER — ONDANSETRON HCL 4 MG/2ML IJ SOLN: 4.0000 mg | Freq: Four times a day (QID) | INTRAMUSCULAR | Status: DC | PRN

## 2016-03-26 MED ORDER — ACETAMINOPHEN 325 MG PO TABS
ORAL_TABLET | ORAL | Status: AC
  Filled 2016-03-26: qty 2

## 2016-03-26 SURGICAL SUPPLY — 10 items
CATH BALLN WEDGE 5F 110CM (CATHETERS) ×2 IMPLANT
CATH IMPULSE 5F ANG/FL3.5 (CATHETERS) ×2 IMPLANT
DEVICE RAD COMP TR BAND LRG (VASCULAR PRODUCTS) ×2 IMPLANT
GLIDESHEATH SLEND SS 6F .021 (SHEATH) ×2 IMPLANT
KIT HEART LEFT (KITS) ×2 IMPLANT
PACK CARDIAC CATHETERIZATION (CUSTOM PROCEDURE TRAY) ×2 IMPLANT
SHEATH FAST CATH BRACH 5F 5CM (SHEATH) ×2 IMPLANT
TRANSDUCER W/STOPCOCK (MISCELLANEOUS) ×2 IMPLANT
TUBING CIL FLEX 10 FLL-RA (TUBING) ×2 IMPLANT
WIRE SAFE-T 1.5MM-J .035X260CM (WIRE) ×2 IMPLANT

## 2016-03-26 NOTE — Discharge Instructions (Signed)
Radial Site Care °Refer to this sheet in the next few weeks. These instructions provide you with information about caring for yourself after your procedure. Your health care provider may also give you more specific instructions. Your treatment has been planned according to current medical practices, but problems sometimes occur. Call your health care provider if you have any problems or questions after your procedure. °WHAT TO EXPECT AFTER THE PROCEDURE °After your procedure, it is typical to have the following: °· Bruising at the radial site that usually fades within 1-2 weeks. °· Blood collecting in the tissue (hematoma) that may be painful to the touch. It should usually decrease in size and tenderness within 1-2 weeks. °HOME CARE INSTRUCTIONS °· Take medicines only as directed by your health care provider. °· You may shower 24-48 hours after the procedure or as directed by your health care provider. Remove the bandage (dressing) and gently wash the site with plain soap and water. Pat the area dry with a clean towel. Do not rub the site, because this may cause bleeding. °· Do not take baths, swim, or use a hot tub until your health care provider approves. °· Check your insertion site every day for redness, swelling, or drainage. °· Do not apply powder or lotion to the site. °· Do not flex or bend the affected arm for 24 hours or as directed by your health care provider. °· Do not push or pull heavy objects with the affected arm for 24 hours or as directed by your health care provider. °· Do not lift over 10 lb (4.5 kg) for 5 days after your procedure or as directed by your health care provider. °· Ask your health care provider when it is okay to: °¨ Return to work or school. °¨ Resume usual physical activities or sports. °¨ Resume sexual activity. °· Do not drive home if you are discharged the same day as the procedure. Have someone else drive you. °· You may drive 24 hours after the procedure unless otherwise  instructed by your health care provider. °· Do not operate machinery or power tools for 24 hours after the procedure. °· If your procedure was done as an outpatient procedure, which means that you went home the same day as your procedure, a responsible adult should be with you for the first 24 hours after you arrive home. °· Keep all follow-up visits as directed by your health care provider. This is important. °SEEK MEDICAL CARE IF: °· You have a fever. °· You have chills. °· You have increased bleeding from the radial site. Hold pressure on the site. CALL 911 °SEEK IMMEDIATE MEDICAL CARE IF: °· You have unusual pain at the radial site. °· You have redness, warmth, or swelling at the radial site. °· You have drainage (other than a small amount of blood on the dressing) from the radial site. °· The radial site is bleeding, and the bleeding does not stop after 30 minutes of holding steady pressure on the site. °· Your arm or hand becomes pale, cool, tingly, or numb. °  °This information is not intended to replace advice given to you by your health care provider. Make sure you discuss any questions you have with your health care provider. °  °Document Released: 08/22/2010 Document Revised: 08/10/2014 Document Reviewed: 02/05/2014 °Elsevier Interactive Patient Education ©2016 [employer] ° °

## 2016-03-26 NOTE — Interval H&P Note (Signed)
History and Physical Interval Note:  03/26/2016 1:52 PM  [name] LucksHarvey R Oleski  has presented today for cardiac catheterization, with the diagnosis of cad with angina and sob  The various methods of treatment have been discussed with the patient and family. After consideration of risks, benefits and other options for treatment, the patient has consented to  Procedure(s): Right/Left Heart Cath and Coronary Angiography (N/A) as a surgical intervention .  The patient's history has been reviewed, patient examined, no change in status, stable for surgery.  I have reviewed the patient's chart and labs.  Questions were answered to the patient's satisfaction.   Cath Lab Visit (complete for each Cath Lab visit)  Clinical Evaluation Leading to the Procedure:   ACS: No.  Non-ACS:    Anginal Classification: CCS III  Anti-ischemic medical therapy: Maximal Therapy (2 or more classes of medications)  Non-Invasive Test Results: No non-invasive testing performed  Prior CABG: No previous CABG   Valary Manahan

## 2016-03-26 NOTE — H&P (View-Only) (Signed)
Cardiology Office Note Date:  03/25/2016   ID:  [name], DOB 02/07/1946, MRN [id number]  PCP:  Pcp Not In System  Cardiologist:  Tonny Bollman, MD    Chief Complaint  Patient presents with  . Coronary Artery Disease  . Shortness of Breath   History of Present Illness: [name] is a 70 y.o. male who presents for follow-up evaluation. He initially presented in 2009 with an anterior MI complicated by cardiac arrest. He underwent primary PCI of the proximal LAD. He has ultimately undergone ICD placement for severe ischemic cardiomyopathy. Since that time he's been followed for chronic systolic heart failure and has largely done well from a cardiac perspective.   He's here with his wife today. He hasn't been doing well of late. Over the past few weeks he has developed episodes of chest pressure, shortness of breath, and lots of belching. He has developed bendopnea as well. Chest pressure is nonexertional, but shortness of breath occurs both at rest and with activity. He 'gives out' with minimal activity. Denies any recent change in eating habits, but has gained weight.  Chest pressure is in center of chest, nonradiating. He also has had episodes of neck and shoulder pain associated with drops in his blood pressure. This is relieved when he sits down to rest.    Past Medical History:  Diagnosis Date  . Acute myocardial infarction of other anterior wall, initial episode of care   . Coronary atherosclerosis of native coronary artery    status post anterior MI 2008 w V. fib arrest  . Dyslipidemia   . Hypertension   . Macular degeneration   . Other and unspecified hyperlipidemia   . Other specified forms of chronic ischemic heart disease    w sever residual LV dysfunc., LVEF less than 30%  . Other vitamin B12 deficiency anemia 08/14/2013  . Polyneuropathy in other diseases classified elsewhere (HCC) 02/12/2014  . Tremor, essential 02/03/2016  . Ventricular fibrillation (HCC)      implantable cardiac defib ?    Past Surgical History:  Procedure Laterality Date  . CARDIAC CATHETERIZATION  09/12/2007  . CATARACT EXTRACTION Bilateral   . ICD medtronic implanted  07/06/08   Dr. Sharrell Ku    Current Outpatient Prescriptions  Medication Sig Dispense Refill  . aspirin (ASPIR-81) 81 MG EC tablet Take 81 mg by mouth daily.      . carvedilol (COREG) 25 MG tablet Take 1 tablet (25 mg total) by mouth 2 (two) times daily. 60 tablet 11  . digoxin (LANOXIN) 0.125 MG tablet TAKE 1 TABLET (0.125 MG TOTAL) BY MOUTH DAILY. 30 tablet 11  . gabapentin (NEURONTIN) 300 MG capsule One capsule in the morning, and at midday, 2 capsules at night 120 capsule 11  . hydrochlorothiazide (HYDRODIURIL) 25 MG tablet Take 1 tablet (25 mg total) by mouth daily. 30 tablet 11  . lisinopril (PRINIVIL,ZESTRIL) 40 MG tablet Take 0.5 tablets (20 mg total) by mouth daily. 30 tablet 11  . nitroGLYCERIN (NITROSTAT) 0.4 MG SL tablet Place 0.4 mg under the tongue every 5 (five) minutes as needed for chest pain (MAX 3 TABLETS).     Marland Kitchen simvastatin (ZOCOR) 40 MG tablet Take 1 tablet (40 mg total) by mouth at bedtime. 30 tablet 11  . vitamin B-12 (CYANOCOBALAMIN) 1000 MCG tablet Take 1,000 mcg by mouth daily.    . isosorbide mononitrate (IMDUR) 30 MG 24 hr tablet Take 0.5 tablets (15 mg total) by mouth daily. 30 tablet 11  .  pantoprazole (PROTONIX) 40 MG tablet Take 1 tablet (40 mg total) by mouth daily. 30 tablet 11   No current facility-administered medications for this visit.     Allergies:   Review of patient's allergies indicates no known allergies.   Social History:  The patient  reports that he has quit smoking. He has never used smokeless tobacco. He reports that he drinks about 4.2 oz of alcohol per week . He reports that he does not use drugs.   Family History:  The patient's  family history includes Cancer - Colon in his sister; Heart attack in his father; Liver disease in his brother; Neuropathy in  his sister and sister.    ROS:  Please see the history of present illness.  Otherwise, review of systems is positive for neuropathic pain in the feet, PND, orthopnea, cough, DOE, fatigue, snoring, balance problems.  All other systems are reviewed and negative.    PHYSICAL EXAM: VS:  BP 124/60   Pulse 72   Ht 5\' 11"  (1.803 m)   Wt 92 kg (202 lb 12.8 oz)   BMI 28.28 kg/m  , BMI Body mass index is 28.28 kg/m. GEN: Well nourished, well developed, in no acute distress  HEENT: normal  Neck: no JVD, no masses. No carotid bruits Cardiac: RRR without murmur or gallop                Respiratory:  clear to auscultation bilaterally, normal work of breathing GI: soft, nontender, nondistended, + BS MS: no deformity or atrophy  Ext: no pretibial edema, pedal pulses 2+= bilaterally Skin: warm and dry, no rash Neuro:  Strength and sensation are intact Psych: euthymic mood, full affect  EKG:  EKG is ordered today. The ekg ordered today shows NSR, nonspecific IVCD, St and T wave changes consider lateral ischemia  Recent Labs: 06/24/2015: ALT 29 03/24/2016: BUN 19; Creat 1.47; Hemoglobin 14.0; Platelets 213; Potassium 4.7; Sodium 138   Lipid Panel     Component Value Date/Time   CHOL 133 06/24/2015 0748   TRIG 155 (H) 06/24/2015 0748   HDL 41 06/24/2015 0748   CHOLHDL 3.2 06/24/2015 0748   VLDL 31 (H) 06/24/2015 0748   LDLCALC 61 06/24/2015 0748      Wt Readings from Last 3 Encounters:  03/24/16 92 kg (202 lb 12.8 oz)  02/03/16 87.8 kg (193 lb 8 oz)  10/23/15 89.8 kg (198 lb)     ASSESSMENT AND PLAN: 1.  Chest pain at rest: symptoms concerning for ischemic chest pain in patient with known CAD. He reports a significant change in symptoms over the past few weeks. With his history of large anterior infarct I don't think noninvasive testing will provide an accurate diagnosis. I've recommended cardiac catheterization and possible PCI. I have reviewed the risks, indications, and alternatives  to cardiac catheterization, possible angioplasty, and stenting with the patient. Risks include but are not limited to bleeding, infection, vascular injury, stroke, myocardial infection, arrhythmia, kidney injury, radiation-related injury in the case of prolonged fluoroscopy use, emergency cardiac surgery, and death. The patient understands the risks of serious complication is 1-2 in 1000 with diagnostic cardiac cath and 1-2% or less with angioplasty/stenting.   2. Acute on chronic systolic heart failure: NYHA IIIB. Will perform right heart cath at the time of his procedure. Continue current medical therapy for now.   3. Hyperlipidemia: continue statin. Most recent lipids reviewed. Dietary counseling done.   4. HTN: BP is in range today but has been elevated  at times. Continue same Rx.   Current medicines are reviewed with the patient today.  The patient does not have concerns regarding medicines.  Labs/ tests ordered today include:   Orders Placed This Encounter  Procedures  . Basic metabolic panel  . CBC  . INR/PT  . EKG 12-Lead    Disposition:   FU after cath  Signed, Tonny Bollmanooper, Kaylen Motl, MD  03/25/2016 10:33 PM    [facility] [address], [place], [place]  [address] Phone: [phone](336) [phone]; Fax: [phone]) [phone]

## 2016-03-27 ENCOUNTER — Encounter (HOSPITAL_COMMUNITY): Payer: Self-pay | Admitting: Cardiovascular Disease

## 2016-03-30 ENCOUNTER — Other Ambulatory Visit: Payer: Self-pay

## 2016-03-30 DIAGNOSIS — I5023 Acute on chronic systolic (congestive) heart failure: Secondary | ICD-10-CM

## 2016-03-30 DIAGNOSIS — R059 Cough, unspecified: Secondary | ICD-10-CM

## 2016-03-30 DIAGNOSIS — R05 Cough: Secondary | ICD-10-CM

## 2016-03-30 DIAGNOSIS — R0602 Shortness of breath: Secondary | ICD-10-CM

## 2016-03-31 ENCOUNTER — Encounter (HOSPITAL_COMMUNITY): Payer: Self-pay | Admitting: Internal Medicine

## 2016-04-09 ENCOUNTER — Other Ambulatory Visit: Payer: Self-pay

## 2016-04-09 ENCOUNTER — Ambulatory Visit (HOSPITAL_COMMUNITY): Payer: Medicare Other | Attending: Cardiology

## 2016-04-09 DIAGNOSIS — I255 Ischemic cardiomyopathy: Secondary | ICD-10-CM | POA: Insufficient documentation

## 2016-04-09 DIAGNOSIS — R29898 Other symptoms and signs involving the musculoskeletal system: Secondary | ICD-10-CM | POA: Insufficient documentation

## 2016-04-09 DIAGNOSIS — I5023 Acute on chronic systolic (congestive) heart failure: Secondary | ICD-10-CM | POA: Diagnosis not present

## 2016-04-09 DIAGNOSIS — I110 Hypertensive heart disease with heart failure: Secondary | ICD-10-CM | POA: Diagnosis not present

## 2016-04-09 DIAGNOSIS — I252 Old myocardial infarction: Secondary | ICD-10-CM | POA: Diagnosis not present

## 2016-04-09 DIAGNOSIS — I77810 Thoracic aortic ectasia: Secondary | ICD-10-CM | POA: Insufficient documentation

## 2016-04-09 DIAGNOSIS — R0600 Dyspnea, unspecified: Secondary | ICD-10-CM | POA: Diagnosis present

## 2016-04-09 DIAGNOSIS — E785 Hyperlipidemia, unspecified: Secondary | ICD-10-CM | POA: Diagnosis not present

## 2016-04-09 DIAGNOSIS — I2510 Atherosclerotic heart disease of native coronary artery without angina pectoris: Secondary | ICD-10-CM | POA: Diagnosis not present

## 2016-04-09 DIAGNOSIS — R0602 Shortness of breath: Secondary | ICD-10-CM | POA: Diagnosis not present

## 2016-04-09 DIAGNOSIS — I351 Nonrheumatic aortic (valve) insufficiency: Secondary | ICD-10-CM | POA: Diagnosis not present

## 2016-04-09 MED ORDER — PERFLUTREN LIPID MICROSPHERE
1.0000 mL | INTRAVENOUS | Status: AC | PRN
  Administered 2016-04-09: 3 mL via INTRAVENOUS

## 2016-04-10 ENCOUNTER — Telehealth: Payer: Self-pay | Admitting: Cardiovascular Disease

## 2016-04-10 NOTE — Telephone Encounter (Signed)
New Message  Pts wife verbalized echo wore pt out, wife wondering if pt needed to do the chest xray.  No C.P. Or SOB.  Please follow up with pts wife.

## 2016-04-10 NOTE — Telephone Encounter (Signed)
Spoke with wife and she states pt had echo yesterday and when [facility] it was a long wait so she took him home.  She just wanted to let us know that pt plans to go Monday and have chest xray done.  Provided number to [facility] to wife as she would like to call before going over on Monday.

## 2016-04-13 ENCOUNTER — Ambulatory Visit
Admission: RE | Admit: 2016-04-13 | Discharge: 2016-04-13 | Disposition: A | Discharge status: Home / Self Care | Payer: Medicare Other | Source: Ambulatory Visit | Attending: Cardiovascular Disease | Admitting: Cardiovascular Disease

## 2016-04-13 DIAGNOSIS — R0602 Shortness of breath: Secondary | ICD-10-CM | POA: Diagnosis not present

## 2016-04-13 DIAGNOSIS — R05 Cough: Secondary | ICD-10-CM

## 2016-04-13 DIAGNOSIS — I5023 Acute on chronic systolic (congestive) heart failure: Secondary | ICD-10-CM

## 2016-04-13 DIAGNOSIS — R059 Cough, unspecified: Secondary | ICD-10-CM

## 2016-04-20 ENCOUNTER — Telehealth: Payer: Self-pay | Admitting: Cardiovascular Disease

## 2016-04-20 NOTE — Telephone Encounter (Signed)
I spoke with the pt's wife and she called the office to make Dr Excell Seltzerooper aware of the pt's symptoms on Saturday.  She said the pt woke up and felt fine until mid morning. The pt then developed off and on episodes of CP, BP was up and down, O2 89-93% and pulse 80-140. Episodes lasted a few minutes each time. She tried to have the pt go to the ER but he refused and wanted to see how things progressed. By the end of Saturday the pt was tired and did not go to work.  Sunday the pt was fine. I made Mrs Talbot GrumblingWoodell aware of CXR and Echo results. The pt has a pending appointment with Dr Excell Seltzerooper on 04/27/2016.  I will forward this information to Dr Excell Seltzerooper to review and make further recommendations.

## 2016-04-20 NOTE — Telephone Encounter (Signed)
New Message  Pts wife voiced wants to speak nurse about pts heart that went on Saturday.  Pts wife voiced he's always having SOB but no CP but did have them on Saturday.  Pts wife voiced he decided not to go to [facility], but pts wife voiced he's doing okay but she wanted to let someone know.  Pts wife voiced she wanted to let Lauren know about the different episodes about Saturday.  Please f/u with pt

## 2016-04-20 NOTE — Telephone Encounter (Signed)
Studies have been reviewed. Will FU with him next week as scheduled unless further symptoms arise.

## 2016-04-22 ENCOUNTER — Ambulatory Visit (INDEPENDENT_AMBULATORY_CARE_PROVIDER_SITE_OTHER): Payer: Medicare Other | Admitting: *Deleted

## 2016-04-22 DIAGNOSIS — I255 Ischemic cardiomyopathy: Secondary | ICD-10-CM

## 2016-04-22 NOTE — Progress Notes (Signed)
Remote ICD transmission.   

## 2016-04-24 ENCOUNTER — Encounter: Payer: Self-pay | Admitting: Cardiology

## 2016-04-27 ENCOUNTER — Encounter (INDEPENDENT_AMBULATORY_CARE_PROVIDER_SITE_OTHER): Payer: Self-pay

## 2016-04-27 ENCOUNTER — Ambulatory Visit (INDEPENDENT_AMBULATORY_CARE_PROVIDER_SITE_OTHER): Payer: Medicare Other | Admitting: Cardiovascular Disease

## 2016-04-27 ENCOUNTER — Telehealth: Payer: Self-pay

## 2016-04-27 ENCOUNTER — Encounter: Payer: Self-pay | Admitting: Cardiovascular Disease

## 2016-04-27 VITALS — BP 90/40 | HR 72 | Ht 71.0 in | Wt 199.4 lb

## 2016-04-27 DIAGNOSIS — I2510 Atherosclerotic heart disease of native coronary artery without angina pectoris: Secondary | ICD-10-CM

## 2016-04-27 DIAGNOSIS — I959 Hypotension, unspecified: Secondary | ICD-10-CM | POA: Diagnosis not present

## 2016-04-27 DIAGNOSIS — I5022 Chronic systolic (congestive) heart failure: Secondary | ICD-10-CM

## 2016-04-27 DIAGNOSIS — I10 Essential (primary) hypertension: Secondary | ICD-10-CM

## 2016-04-27 DIAGNOSIS — I25119 Atherosclerotic heart disease of native coronary artery with unspecified angina pectoris: Secondary | ICD-10-CM

## 2016-04-27 NOTE — Telephone Encounter (Signed)
I spoke with the pt's wife and the pt's BP is now 104/53.  She said the pt does think that he took both HCTZ and Furosemide this morning.  The pt has thrown out his bottle of HCTZ.  I advised that the pt follow instructions from his appointment today with Dr Excell Seltzerooper. Hold Imdur until further notice, do not take evening dose of Carvedilol and push fluids.  I will contact the pt in the morning to see what his BP is running. Wife agreed with plan.

## 2016-04-27 NOTE — Progress Notes (Signed)
Cardiology Office Note Date:  04/27/2016   ID:  [name], DOB 04/06/1946, MRN [id number]  PCP:  No PCP Per Patient  Cardiologist:  Tonny Bollman, MD    Chief Complaint  Patient presents with  . Coronary Artery Disease    f/u from echo and CXR     History of Present Illness: [name] is a 70 y.o. male who presents for follow-up evaluation. He initially presented in 2009 with an anterior MI complicated by cardiac arrest. He underwent primary PCI of the proximal LAD. He has ultimately undergone ICD placement for severe ischemic cardiomyopathy.   He recently presented with chest pain and underwent cardiac catheterization. This demonstrated patency of his stent site in the proximal LAD and nonobstructive CAD. He continues to have exertional dyspnea, fatigue, and symptoms of 'giving out' with exertion. No syncope, orthopnea, or PND. Here with this wife, [name], today.   Past Medical History:  Diagnosis Date  . Acute myocardial infarction of other anterior wall, initial episode of care   . Coronary atherosclerosis of native coronary artery    status post anterior MI 2008 w V. fib arrest  . Dyslipidemia   . Hypertension   . Macular degeneration   . Other and unspecified hyperlipidemia   . Other specified forms of chronic ischemic heart disease    w sever residual LV dysfunc., LVEF less than 30%  . Other vitamin B12 deficiency anemia 08/14/2013  . Polyneuropathy in other diseases classified elsewhere (HCC) 02/12/2014  . Tremor, essential 02/03/2016  . Ventricular fibrillation (HCC)    implantable cardiac defib ?    Past Surgical History:  Procedure Laterality Date  . CARDIAC CATHETERIZATION  09/12/2007  . CARDIAC CATHETERIZATION N/A 03/26/2016   Procedure: Right/Left Heart Cath and Coronary Angiography;  Surgeon: Yvonne Kendall, MD;  Location: [facility] INVASIVE CV LAB;  Service: Cardiovascular;  Laterality: N/A;  . CATARACT EXTRACTION Bilateral   . ICD medtronic implanted   07/06/08   Dr. Sharrell Ku    Current Outpatient Prescriptions  Medication Sig Dispense Refill  . aspirin (ASPIR-81) 81 MG EC tablet Take 81 mg by mouth daily.      . carvedilol (COREG) 25 MG tablet Take 1 tablet (25 mg total) by mouth 2 (two) times daily. 60 tablet 11  . digoxin (LANOXIN) 0.125 MG tablet TAKE 1 TABLET (0.125 MG TOTAL) BY MOUTH DAILY. 30 tablet 11  . furosemide (LASIX) 40 MG tablet Take 1 tablet (40 mg total) by mouth daily. 30 tablet 5  . gabapentin (NEURONTIN) 300 MG capsule Take 300 mg by mouth as directed. Take 1 capsule by mouth in the morning and at lunch, take 2 capsules by mouth at night    . isosorbide mononitrate (IMDUR) 30 MG 24 hr tablet Take 1 tablet (30 mg total) by mouth daily. 30 tablet 11  . lisinopril (PRINIVIL,ZESTRIL) 40 MG tablet Take 0.5 tablets (20 mg total) by mouth daily. 30 tablet 11  . nitroGLYCERIN (NITROSTAT) 0.4 MG SL tablet Place 0.4 mg under the tongue every 5 (five) minutes as needed for chest pain (MAX 3 TABLETS).     . pantoprazole (PROTONIX) 40 MG tablet Take 1 tablet (40 mg total) by mouth daily. 30 tablet 11  . simvastatin (ZOCOR) 40 MG tablet Take 1 tablet (40 mg total) by mouth at bedtime. 30 tablet 11  . vitamin B-12 (CYANOCOBALAMIN) 1000 MCG tablet Take 1,000 mcg by mouth daily.     No current facility-administered medications for this visit.  Allergies:   Review of patient's allergies indicates no known allergies.   Social History:  The patient  reports that he has quit smoking. He has never used smokeless tobacco. He reports that he drinks about 4.2 oz of alcohol per week . He reports that he does not use drugs.   Family History:  The patient's  family history includes Cancer - Colon in his sister; Heart attack in his father; Liver disease in his brother; Neuropathy in his sister and sister.    ROS:  Please see the history of present illness.  All other systems are reviewed and negative.    PHYSICAL EXAM: VS:  BP (!) 90/40    Pulse 72   Ht 5\' 11"  (1.803 m)   Wt 199 lb 6.4 oz (90.4 kg)   SpO2 97%   BMI 27.81 kg/m  , BMI Body mass index is 27.81 kg/m. GEN: Well nourished, well developed, in no acute distress  HEENT: normal  Neck: no JVD, no masses. No carotid bruits Cardiac: RRR without murmur or gallop                Respiratory:  clear to auscultation bilaterally, normal work of breathing GI: soft, nontender, nondistended, + BS MS: no deformity or atrophy  Ext: no pretibial edema, pedal pulses 2+= bilaterally Skin: warm and dry, no rash Neuro:  Strength and sensation are intact Psych: euthymic mood, full affect  EKG:  EKG is not ordered today.  Recent Labs: 06/24/2015: ALT 29 03/24/2016: BUN 19; Creat 1.47; Hemoglobin 14.0; Platelets 213; Potassium 4.7; Sodium 138   Lipid Panel     Component Value Date/Time   CHOL 133 06/24/2015 0748   TRIG 155 (H) 06/24/2015 0748   HDL 41 06/24/2015 0748   CHOLHDL 3.2 06/24/2015 0748   VLDL 31 (H) 06/24/2015 0748   LDLCALC 61 06/24/2015 0748      Wt Readings from Last 3 Encounters:  04/27/16 199 lb 6.4 oz (90.4 kg)  03/26/16 202 lb (91.6 kg)  03/24/16 202 lb 12.8 oz (92 kg)     Cardiac Studies Reviewed: CXR: IMPRESSION: Probable COPD changes with LEFT upper lobe scarring.  Pericardial calcification at the RIGHT heart border which could reflect prior pericarditis, present at least in part since 01/15/2006.  No acute abnormalities.  Echo 04-09-2016: Study Conclusions  - Left ventricle: The cavity size was normal. Wall thickness was   increased in a pattern of mild LVH. Systolic function was   severely reduced. The estimated ejection fraction was in the   range of 25% to 30%. Anteroseptal, inferoseptal, apical akinesis.   Akinesis of the apical inferior wall. Severe anterior   hypokinesis. Doppler parameters are consistent with abnormal left   ventricular relaxation (grade 1 diastolic dysfunction). - Aortic valve: There was no stenosis.  There was trivial   regurgitation. - Aorta: Mildly dilated aortic root and ascending aorta. Ascending   aorta dimension: 38 mm. Aortic root dimension: 39 mm (ED). - Mitral valve: There was no significant regurgitation. - Right ventricle: The cavity size was normal. Pacer wire or   catheter noted in right ventricle. Systolic function was mildly   reduced. - Pulmonary arteries: No complete TR doppler jet so unable to   estimate PA systolic pressure. - Inferior vena cava: The vessel was normal in size. The   respirophasic diameter changes were in the normal range (= 50%),   consistent with normal central venous pressure.  Impressions:  - Normal LV size with mild LV  hypertrophy. EF 25-30%, wall motion   abnormalities as noted above. Normal RV size with mildly   decreased systolic function. No significant valvular   abnormalities.  Cath 03-26-2016: Conclusion    Widely patent stent in ostial/proximal LAD.  Mild to moderate, non-obstructive coronary artery disease involving proximal RCA and distal LMCA.  Mildly elevated left ventricular filling pressure, right ventricular filling pressure, and pulmonary artery pressure.  Normal Fick Cardiac output.  Plan:  Continue aggressive secondary prevention and medical therapy, including escalation of afterload reduction given systemic hypertension and elevated filling pressures.  Outpatient follow-up with Dr. Excell Seltzerooper as previously planned.    ASSESSMENT AND PLAN: 1.  Hypotension: BP is low today. On my check BP is 80/40. He feels ok and doesn't appear acutely ill. He thinks he may have taken his medications incorrectly overnight and this morning. He was asked to call when he gets home and read us the medications exactly as he is taking. Will hold antihypertensives until we hear back from him.   2. Chronic systolic HF, NYHA II: continues to have some limitations. No exam evidence of volume overload. On appropriate medical therapy. REcent  right heart cath hemodynamics reviewed with preserved cardiac output.   3. CAD, native vessel, with old MI: continue medical Rx of CAD. No angina.     Current medicines are reviewed with the patient today.  The patient does not have concerns regarding medicines.  Labs/ tests ordered today include:  No orders of the defined types were placed in this encounter.   Disposition:   FU one month APP  Signed, Tonny Bollmanooper, Zniya Cottone, MD  04/27/2016 10:57 AM    [facility] [address], [place], [place]  [address] Phone: [phone]) [phone]; Fax: [phone]) [phone]

## 2016-04-27 NOTE — Patient Instructions (Signed)
Medication Instructions:  Your physician has recommended you make the following change in your medication:  1. HOLD Imdur at this time 2. HOLD evening dose of Carvedilol today 3. Drink plenty of fluids today 4. Keep an eye on BP, I will call you later today to review your medications  Labwork: No new orders.   Testing/Procedures: No new orders.   Follow-Up: Your physician recommends that you schedule a follow-up appointment in: 1 MONTH with Tereso NewcomerScott Weaver PA-C   Any Other Special Instructions Will Be Listed Below (If Applicable).     If you need a refill on your cardiac medications before your next appointment, please call your pharmacy.

## 2016-04-28 NOTE — Telephone Encounter (Signed)
I spoke with the pt's wife and the pt's BP during the day has been 90/40 and 113/50.  I advised that the pt hold his evening dose of carvedilol.  The pt will check his BP and pulse in the morning and I will contact them by phone to give further instructions.

## 2016-04-28 NOTE — Telephone Encounter (Signed)
Agree thanks. Suspect his fatigue is at least partly due to hypotension. May have to add meds back slowly.

## 2016-04-28 NOTE — Telephone Encounter (Signed)
I spoke with the pt's wife and the pt's BP when he woke up was 126/60s, before he took his shower the systolic BP dropped to 99 and then after his shower was 110.   Pt has not taken any medications at this time.  I advised that the pt hold Imdur, lisinopril and Furosemide at this time.  [name] said the pt's pulse was okay but she could not remember the exact number.  I advised that the pt take Carvedilol 12.5mg  this morning (half of normal dosage) and continue to monitor BP and pulse.  I will forward this information to Dr Excell Seltzerooper for further review and recommendations.

## 2016-04-29 MED ORDER — ISOSORBIDE MONONITRATE ER 30 MG PO TB24: ORAL_TABLET | ORAL | 11 refills | Status: DC

## 2016-04-29 MED ORDER — CARVEDILOL 25 MG PO TABS: 12.5000 mg | ORAL_TABLET | Freq: Two times a day (BID) | ORAL | 11 refills | Status: DC

## 2016-04-29 MED ORDER — LISINOPRIL 40 MG PO TABS: ORAL_TABLET | ORAL | 11 refills | Status: DC

## 2016-04-29 NOTE — Telephone Encounter (Signed)
Hold lisinopril and Imdur. Start back on carvedilol 12.5 mg BID and lasix 40 mg daily

## 2016-04-29 NOTE — Telephone Encounter (Signed)
I spoke with the pt's wife and made her aware of recommendations from Dr Excell Seltzerooper. Medication list updated.  The pt's wife will contact the office with any other questions or concerns.

## 2016-04-29 NOTE — Telephone Encounter (Signed)
I spoke with the pt's wife and the pt has not taken his cardiac medications this morning, BP 133/68 and 126/63 pulse 69.  I will forward this information to Dr Excell Seltzerooper to review and give orders on how the pt should take his cardiac medications at this time (please address Carvedilol, Furosemide, and lisinopril doses, Imdur has been hold).

## 2016-05-11 ENCOUNTER — Encounter: Payer: Self-pay | Admitting: Physician Assistant

## 2016-05-14 LAB — CUP PACEART REMOTE DEVICE CHECK
Battery Voltage: 2.64 V
Date Time Interrogation Session: 20170920125500
HIGH POWER IMPEDANCE MEASURED VALUE: 47 Ohm
Implantable Lead Implant Date: 20091204
Implantable Lead Location: 753860
Lead Channel Impedance Value: 656 Ohm
Lead Channel Sensing Intrinsic Amplitude: 12.7 mV
Lead Channel Setting Sensing Sensitivity: 0.6 mV
MDC IDC SET LEADCHNL RV PACING AMPLITUDE: 2.5 V
MDC IDC SET LEADCHNL RV PACING PULSEWIDTH: 0.4 ms
MDC IDC STAT BRADY RV PERCENT PACED: 0 %

## 2016-05-25 ENCOUNTER — Encounter: Payer: Self-pay | Admitting: Physician Assistant

## 2016-05-25 ENCOUNTER — Ambulatory Visit (INDEPENDENT_AMBULATORY_CARE_PROVIDER_SITE_OTHER): Payer: Medicare Other | Admitting: Physician Assistant

## 2016-05-25 VITALS — BP 122/60 | HR 88 | Ht 71.0 in | Wt 196.8 lb

## 2016-05-25 DIAGNOSIS — I5022 Chronic systolic (congestive) heart failure: Secondary | ICD-10-CM

## 2016-05-25 DIAGNOSIS — I2510 Atherosclerotic heart disease of native coronary artery without angina pectoris: Secondary | ICD-10-CM

## 2016-05-25 DIAGNOSIS — Z95810 Presence of automatic (implantable) cardiac defibrillator: Secondary | ICD-10-CM

## 2016-05-25 DIAGNOSIS — R0602 Shortness of breath: Secondary | ICD-10-CM

## 2016-05-25 DIAGNOSIS — E7800 Pure hypercholesterolemia, unspecified: Secondary | ICD-10-CM | POA: Diagnosis not present

## 2016-05-25 MED ORDER — LOSARTAN POTASSIUM 25 MG PO TABS: 25.0000 mg | ORAL_TABLET | Freq: Every day | ORAL | 3 refills | Status: DC

## 2016-05-25 NOTE — Patient Instructions (Addendum)
Medication Instructions:  1. STOP IMDUR 2. STOP LISINOPRIL 3. START LOSARTAN 25 MG DAILY; RX HAS BEEN SENT IN  Labwork: BMET TO BE DONE IN 1 WEEK  Testing/Procedures: NONE  Follow-Up: DR. Excell SeltzerOOPER IN 3 MONTHS  Any Other Special Instructions Will Be Listed Below (If Applicable).  If you need a refill on your cardiac medications before your next appointment, please call your pharmacy.

## 2016-05-25 NOTE — Progress Notes (Signed)
Cardiology Office Note:    Date:  05/25/2016   ID:  Boneta Lucks, DOB 1945-08-07, MRN [id number]  PCP:  No PCP Per Patient  Cardiologist:  Dr. Tonny Bollman   Electrophysiologist: Dr. Lewayne Bunting   Referring MD: No ref. provider found   Chief Complaint  Patient presents with  . Follow-up    CHF, BP    History of Present Illness:    [name] KUMPF is a 70 y.o. male with a hx of CAD s/p anterior MI in 2009 c/b cardiac arrest tx with PCI of proximal LAD, ischemic CM, s/p ICD.  Last seen by Dr. Tonny Bollman in 9/17.  He was hypotensive with blood pressure of 80/40.  His ACE and Nitrates were placed on hold and he returns for FU today.  Here with his wife.  He is doing well. He actually feels good since stopping the ACE and Isosorbide.  He denies chest pain. He has chronic dyspnea on exertion.  His shortness of breath is unchanged.  He denies orthopnea, PND edema.  He denies syncope. He is mainly limited by his peripheral neuropathy.    Prior CV studies that were reviewed today include:    Echo 04/09/16 Mild LVH, EF 25-30, ant-septal, inf-septal, apical AK, apical inf AK, severe ant HK, Gr 1 DD, mildly dilated aortic root and ascending aorta (asc Ao 38 mm, Ao root 39 mm), mild reduced RVSF  LHC 03/26/16 LM 30 LAD proximal stent patent LCx no significant disease RCA proximal 10  Carotid US 12/13 Bilateral ICA 0-39%   Past Medical History:  Diagnosis Date  . CAD (coronary artery disease)    status post anterior MI 2008 w V. fib arrest // LHC 8/17: LM 30, pLAD stent ok, LCx ok, pRCA 10  . Carotid artery disease (HCC)    Carotid US 12/13: Bilateral ICA 0-39%  . Dyslipidemia   . History of acute anterior wall MI 2009   s/p stenting to LAD  . Hypertension   . Ischemic cardiomyopathy    a. Echo 9/17: Mild LVH, EF 25-30, ant-septal, inf-septal, apical AK, apical inf AK, severe ant HK, Gr 1 DD, mildly dilated aortic root and ascending aorta (asc Ao 38 mm, Ao root 39 mm),  mild reduced RVSF  . Macular degeneration   . Other vitamin B12 deficiency anemia 08/14/2013  . Polyneuropathy in other diseases classified elsewhere (HCC) 02/12/2014  . Tremor, essential 02/03/2016  . Ventricular fibrillation [facility])    s/p ICD    Past Surgical History:  Procedure Laterality Date  . CARDIAC CATHETERIZATION  09/12/2007  . CARDIAC CATHETERIZATION N/A 03/26/2016   Procedure: Right/Left Heart Cath and Coronary Angiography;  Surgeon: Yvonne Kendall, MD;  Location: [facility] INVASIVE CV LAB;  Service: Cardiovascular;  Laterality: N/A;  . CATARACT EXTRACTION Bilateral   . ICD medtronic implanted  07/06/08   Dr. Sharrell Ku    Current Medications: Current Meds  Medication Sig  . aspirin (ASPIR-81) 81 MG EC tablet Take 81 mg by mouth daily.    . carvedilol (COREG) 25 MG tablet Take 0.5 tablets (12.5 mg total) by mouth 2 (two) times daily.  . digoxin (LANOXIN) 0.125 MG tablet TAKE 1 TABLET (0.125 MG TOTAL) BY MOUTH DAILY.  . furosemide (LASIX) 40 MG tablet Take 1 tablet (40 mg total) by mouth daily.  Marland Kitchen gabapentin (NEURONTIN) 300 MG capsule Take 300 mg by mouth as directed. Take 1 capsule by mouth in the morning and at lunch, take 2 capsules by mouth  at night  . nitroGLYCERIN (NITROSTAT) 0.4 MG SL tablet Place 0.4 mg under the tongue every 5 (five) minutes as needed for chest pain (MAX 3 TABLETS).   . pantoprazole (PROTONIX) 40 MG tablet Take 1 tablet (40 mg total) by mouth daily.  . simvastatin (ZOCOR) 40 MG tablet Take 1 tablet (40 mg total) by mouth at bedtime.  . vitamin B-12 (CYANOCOBALAMIN) 1000 MCG tablet Take 1,000 mcg by mouth daily.  . [DISCONTINUED] isosorbide mononitrate (IMDUR) 30 MG 24 hr tablet ON HOLD  . [DISCONTINUED] lisinopril (PRINIVIL,ZESTRIL) 40 MG tablet ON HOLD     Allergies:   Review of patient's allergies indicates no known allergies.   Social History   Social History  . Marital status: Married    Spouse name: [name]  . Number of children: 4  . Years of  education: HS   Occupational History  . Retired [employer]   Social History Main Topics  . Smoking status: Former Games developermoker  . Smokeless tobacco: Never Used     Comment: QUIT 09/2007  . Alcohol use 4.2 oz/week    7 Standard drinks or equivalent per week     Comment: beer  . Drug use: No  . Sexual activity: Not Asked   Other Topics Concern  . None   Social History Narrative   Patient lives at home with his wife [name](Rhonda).   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   Education high school.   Left handed.   Caffeine one cup of coffee daily.      Family History:  The patient's family history includes Cancer - Colon in his sister; Heart attack in his father; Liver disease in his brother; Neuropathy in his sister and sister.   ROS:   Please see the history of present illness.    ROS All other systems reviewed and are negative.   EKGs/Labs/Other Test Reviewed:    EKG:  EKG is  ordered today.  The ekg ordered today demonstrates NSR, HR 88, LAD, IVCD, TWI V5-6, no significant change since 03/24/16  Recent Labs: 06/24/2015: ALT 29 03/24/2016: BUN 19; Creat 1.47; Hemoglobin 14.0; Platelets 213; Potassium 4.7; Sodium 138   Recent Lipid Panel    Component Value Date/Time   CHOL 133 06/24/2015 0748   TRIG 155 (H) 06/24/2015 0748   HDL 41 06/24/2015 0748   CHOLHDL 3.2 06/24/2015 0748   VLDL 31 (H) 06/24/2015 0748   LDLCALC 61 06/24/2015 0748     Physical Exam:    VS:  BP 122/60   Pulse 88   Ht 5\' 11"  (1.803 m)   Wt 196 lb 12.8 oz (89.3 kg)   BMI 27.45 kg/m     Wt Readings from Last 3 Encounters:  05/25/16 196 lb 12.8 oz (89.3 kg)  04/27/16 199 lb 6.4 oz (90.4 kg)  03/26/16 202 lb (91.6 kg)     Physical Exam  Constitutional: He is oriented to person, place, and time. He appears well-developed and well-nourished. No distress.  HENT:  Head: Normocephalic and atraumatic.  Eyes: No scleral icterus.  Neck: No JVD present.  Cardiovascular: Normal rate,  regular rhythm and normal heart sounds.   No murmur heard. Pulmonary/Chest: He has decreased breath sounds. He has no wheezes. He has no rales.  Abdominal: Soft. There is no tenderness.  Musculoskeletal: He exhibits no edema.  Neurological: He is alert and oriented to person, place, and time.  Skin: Skin is warm and dry.  Psychiatric: He has a normal mood  and affect.    ASSESSMENT:    1. Chronic systolic heart failure (HCC)   2. Coronary artery disease involving native coronary artery of native heart without angina pectoris   3. Implantable cardioverter-defibrillator (ICD) in situ   4. Shortness of breath   5. Pure hypercholesterolemia    PLAN:    In order of problems listed above:  1. Chronic Systolic CHF - NYHA 2-2b.  His volume is stable.  He recently had his ACE inhibitor and Imdur held due to low BP. He does feel much better.  I think his blood pressure could tol a low dose of an angiotensin receptor blocker. He does note a hx of cough that is better since the ACE inhibitor was stopped.  -  Continue current dose of beta-blocker, Lasix.  -  Add Losartan 25 mg QD  -  BMET 1 week  2. CAD - Hx of anterior MI tx with PCI to LAD.  LHC in 8/17 with patent LAD stent and no significant disease elsewhere.  No angina.  Continue beta-blocker, statin, ASA.  Start angiotensin receptor blocker as noted.   3. S/p ICD - FU with EP as planned.  4. Dyspnea - He has chronic dyspnea and a long hx of smoking. CXR in the past was c/w COPD.  He denies significant cough or wheezing. I have suggested he establish with PCP to further address potential COPD.  5. HL - Continue statin.  LDL in 11/16 was 61.   Medication Adjustments/Labs and Tests Ordered: Current medicines are reviewed at length with the patient today.  Concerns regarding medicines are outlined above.  Medication changes, Labs and Tests ordered today are outlined in the Patient Instructions noted below. Patient Instructions  Medication  Instructions:  1. STOP IMDUR 2. STOP LISINOPRIL 3. START LOSARTAN 25 MG DAILY; RX HAS BEEN SENT IN  Labwork: BMET TO BE DONE IN 1 WEEK  Testing/Procedures: NONE  Follow-Up: DR. Excell Seltzer IN 3 MONTHS  Any Other Special Instructions Will Be Listed Below (If Applicable).  If you need a refill on your cardiac medications before your next appointment, please call your pharmacy.  Signed, Tereso Newcomer, PA-C  05/25/2016 9:18 PM    [facility] [address] [place], [place], [place]  [address] Phone: [phone]; Fax: [phone]

## 2016-06-01 ENCOUNTER — Other Ambulatory Visit: Payer: Medicare Other | Admitting: *Deleted

## 2016-06-01 ENCOUNTER — Telehealth: Payer: Self-pay | Admitting: *Deleted

## 2016-06-01 DIAGNOSIS — E7800 Pure hypercholesterolemia, unspecified: Secondary | ICD-10-CM

## 2016-06-01 LAB — BASIC METABOLIC PANEL
BUN: 15 mg/dL (ref 7–25)
CALCIUM: 9.2 mg/dL (ref 8.6–10.3)
CO2: 28 mmol/L (ref 20–31)
CREATININE: 1.3 mg/dL — AB (ref 0.70–1.25)
Chloride: 101 mmol/L (ref 98–110)
Glucose, Bld: 115 mg/dL — ABNORMAL HIGH (ref 65–99)
Potassium: 4.1 mmol/L (ref 3.5–5.3)
SODIUM: 139 mmol/L (ref 135–146)

## 2016-06-01 NOTE — Telephone Encounter (Signed)
Lmtcb to go over lab results 

## 2016-06-02 ENCOUNTER — Telehealth: Payer: Self-pay | Admitting: Physician Assistant

## 2016-06-02 NOTE — Telephone Encounter (Signed)
Follow Up:; ° ° °Returning your call. °

## 2016-06-02 NOTE — Telephone Encounter (Signed)
DPR ok to s/w pt's wife. Pt's wife has been notified of lab results by phone with verbal understanding.  

## 2016-07-14 ENCOUNTER — Other Ambulatory Visit: Payer: Self-pay | Admitting: Cardiovascular Disease

## 2016-07-14 DIAGNOSIS — I5022 Chronic systolic (congestive) heart failure: Secondary | ICD-10-CM

## 2016-07-14 DIAGNOSIS — I10 Essential (primary) hypertension: Secondary | ICD-10-CM

## 2016-07-14 DIAGNOSIS — I2510 Atherosclerotic heart disease of native coronary artery without angina pectoris: Secondary | ICD-10-CM

## 2016-07-18 ENCOUNTER — Other Ambulatory Visit: Payer: Self-pay | Admitting: Cardiovascular Disease

## 2016-07-18 DIAGNOSIS — I5022 Chronic systolic (congestive) heart failure: Secondary | ICD-10-CM

## 2016-07-18 DIAGNOSIS — I2510 Atherosclerotic heart disease of native coronary artery without angina pectoris: Secondary | ICD-10-CM

## 2016-07-18 DIAGNOSIS — I10 Essential (primary) hypertension: Secondary | ICD-10-CM

## 2016-07-22 ENCOUNTER — Telehealth: Payer: Self-pay | Admitting: Cardiology

## 2016-07-22 ENCOUNTER — Ambulatory Visit (INDEPENDENT_AMBULATORY_CARE_PROVIDER_SITE_OTHER): Payer: Medicare Other | Admitting: *Deleted

## 2016-07-22 DIAGNOSIS — I255 Ischemic cardiomyopathy: Secondary | ICD-10-CM | POA: Diagnosis not present

## 2016-07-22 NOTE — Progress Notes (Signed)
Remote ICD transmission.   

## 2016-07-22 NOTE — Telephone Encounter (Signed)
LMOVM reminding pt to send remote transmission.   

## 2016-07-24 LAB — CUP PACEART REMOTE DEVICE CHECK
Brady Statistic RV Percent Paced: 0 %
Date Time Interrogation Session: 20171220175100
HIGH POWER IMPEDANCE MEASURED VALUE: 47 Ohm
Implantable Lead Implant Date: 20091204
Implantable Lead Location: 753860
Implantable Lead Model: 6947
MDC IDC MSMT BATTERY VOLTAGE: 2.64 V
MDC IDC MSMT LEADCHNL RV IMPEDANCE VALUE: 512 Ohm
MDC IDC MSMT LEADCHNL RV SENSING INTR AMPL: 10.1 mV
MDC IDC PG IMPLANT DT: 20091204
MDC IDC SET LEADCHNL RV PACING AMPLITUDE: 2.5 V
MDC IDC SET LEADCHNL RV PACING PULSEWIDTH: 0.4 ms
MDC IDC SET LEADCHNL RV SENSING SENSITIVITY: 0.6 mV

## 2016-08-05 ENCOUNTER — Ambulatory Visit: Payer: Medicare Other | Admitting: Neurology

## 2016-08-06 ENCOUNTER — Ambulatory Visit (INDEPENDENT_AMBULATORY_CARE_PROVIDER_SITE_OTHER): Payer: Medicare Other | Admitting: Neurology

## 2016-08-06 ENCOUNTER — Encounter: Payer: Self-pay | Admitting: Neurology

## 2016-08-06 VITALS — BP 138/68 | HR 74 | Ht 71.0 in | Wt 206.0 lb

## 2016-08-06 DIAGNOSIS — G63 Polyneuropathy in diseases classified elsewhere: Secondary | ICD-10-CM | POA: Diagnosis not present

## 2016-08-06 MED ORDER — GABAPENTIN 300 MG PO CAPS: 300.0000 mg | ORAL_CAPSULE | ORAL | 5 refills | Status: DC

## 2016-08-06 NOTE — Progress Notes (Signed)
Reason for visit: Peripheral neuropathy  [name] is an 71 y.o. male  History of present illness:  [name] is a 71 year old left-handed white male with a history of a peripheral neuropathy. The patient also has an essential tremor. 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 He denies that he has to work at heights. The patient does have some low back pain off and on as well. He has not had any falls, he denies any difficulty with driving in terms of feeling the brake pedal and accelerator. The patient does have 2 out of 7 nights a week where he does have increased discomfort in the evening hours. He currently is on gabapentin taking 300 mg in the morning, 1 at midday, and 2 in the evening. He returns for an evaluation.  Past Medical History:  Diagnosis Date  . CAD (coronary artery disease)    status post anterior MI 2008 w V. fib arrest // LHC 8/17: LM 30, pLAD stent ok, LCx ok, pRCA 10  . Carotid artery disease (HCC)    Carotid US 12/13: Bilateral ICA 0-39%  . Dyslipidemia   . History of acute anterior wall MI 2009   s/p stenting to LAD  . Hypertension   . Ischemic cardiomyopathy    a. Echo 9/17: Mild LVH, EF 25-30, ant-septal, inf-septal, apical AK, apical inf AK, severe ant HK, Gr 1 DD, mildly dilated aortic root and ascending aorta (asc Ao 38 mm, Ao root 39 mm), mild reduced RVSF  . Macular degeneration   . Other vitamin B12 deficiency anemia 08/14/2013  . Polyneuropathy in other diseases classified elsewhere (HCC) 02/12/2014  . Tremor, essential 02/03/2016  . 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 CARDIAC CATHETERIZATION  09/12/2007  . CARDIAC CATHETERIZATION N/A 03/26/2016   Procedure: Right/Left Heart Cath and Coronary Angiography;  Surgeon: Yvonne Kendallhristopher End, MD;  [facility];  Service: Cardiovascular;  Laterality: N/A;  .  CATARACT EXTRACTION Bilateral   . ICD medtronic implanted  07/06/08   Dr. Sharrell KuGreg Taylor    Family History  Problem Relation Age of Onset  . Heart attack Father   . Cancer - Colon Sister   . Liver disease Brother   . Neuropathy Sister   . Neuropathy Sister     Social history:  reports that he has quit smoking. He has never used smokeless tobacco. He reports that he drinks about 4.2 oz of alcohol per week . He reports that he does not use drugs.   No Known Allergies  Medications:  Prior to Admission medications   Medication Sig Start Date End Date Taking? Authorizing Provider  aspirin (ASPIR-81) 81 MG EC tablet Take 81 mg by mouth daily.     Yes Historical Provider, MD  carvedilol (COREG) 25 MG tablet Take 0.5 tablets (12.5 mg total) by mouth 2 (two) times daily. 04/29/16  Yes Tonny BollmanMichael Cooper, MD  digoxin (LANOXIN) 0.125 MG tablet TAKE 1 TABLET BY MOUTH DAILY. 07/20/16  Yes Tonny BollmanMichael Cooper, MD  furosemide (LASIX) 40 MG tablet Take 1 tablet (40 mg total) by mouth daily. 03/26/16  Yes Tonny BollmanMichael Cooper, MD  gabapentin (NEURONTIN) 300 MG capsule Take 1 capsule (300 mg total) by mouth as directed. Take 1 capsule by mouth in the morning and at lunch, take 3 capsules by mouth at night  08/06/16  Yes York Spaniel, MD  losartan (COZAAR) 25 MG tablet Take 1 tablet (25 mg total) by mouth daily. 05/25/16 08/23/16 Yes Scott T Weaver, PA-C  nitroGLYCERIN (NITROSTAT) 0.4 MG SL tablet Place 0.4 mg under the tongue every 5 (five) minutes as needed for chest pain (MAX 3 TABLETS).    Yes Historical Provider, MD  pantoprazole (PROTONIX) 40 MG tablet Take 1 tablet (40 mg total) by mouth daily. 03/24/16  Yes Tonny Bollman, MD  simvastatin (ZOCOR) 40 MG tablet TAKE 1 TABLET (40 MG TOTAL) BY MOUTH AT BEDTIME. 07/14/16  Yes Tonny Bollman, MD  vitamin B-12 (CYANOCOBALAMIN) 1000 MCG tablet Take 1,000 mcg by mouth daily.   Yes Historical Provider, MD    ROS:  Out of a complete 14 system review of symptoms, the patient  complains only of the following symptoms, and all other reviewed systems are negative.  Back pain  Blood pressure 138/68, pulse 74, height 5\' 11"  (1.803 m), weight 206 lb (93.4 kg).  Physical Exam  General: The patient is alert and cooperative at the time of the examination.  Skin: No significant peripheral edema is noted.   Neurologic Exam  Mental status: The patient is alert and oriented x 3 at the time of the examination. The patient has apparent normal recent and remote memory, with an apparently normal attention span and concentration ability.   Cranial nerves: Facial symmetry is present. Speech is normal, no aphasia or dysarthria is noted. Extraocular movements are full. Visual fields are full. The patient has a head and neck tremor.  Motor: The patient has good strength in all 4 extremities.  Sensory examination: Soft touch sensation is symmetric on the face, arms, and legs.  Coordination: The patient has good finger-nose-finger and heel-to-shin bilaterally. An intention tremor seen with finger-nose-finger bilaterally.  Gait and station: The patient has a normal gait. Tandem gait is unsteady. Romberg is negative. No drift is seen.  Reflexes: Deep tendon reflexes are symmetric.   Assessment/Plan:  1. Peripheral neuropathy  2. Essential tremor  3. Low back pain  The patient will have an increase in the gabapentin dosing to help the neuropathy pain, essential tremor, and the low back pain. The patient will take 1 capsule in the morning, one midday, and 3 in the evening. He will follow-up in 6 months, sooner if needed.  Marlan Palau MD 08/06/2016 3:20 PM  [facility] [address] [place], [place] [address]  Phone [phone] Fax [phone]

## 2016-08-26 ENCOUNTER — Encounter: Payer: Self-pay | Admitting: Cardiovascular Disease

## 2016-08-26 ENCOUNTER — Ambulatory Visit (INDEPENDENT_AMBULATORY_CARE_PROVIDER_SITE_OTHER): Payer: Medicare Other | Admitting: Cardiovascular Disease

## 2016-08-26 VITALS — BP 132/68 | HR 80 | Ht 71.0 in | Wt 205.4 lb

## 2016-08-26 DIAGNOSIS — I5022 Chronic systolic (congestive) heart failure: Secondary | ICD-10-CM | POA: Diagnosis not present

## 2016-08-26 DIAGNOSIS — E7800 Pure hypercholesterolemia, unspecified: Secondary | ICD-10-CM | POA: Diagnosis not present

## 2016-08-26 DIAGNOSIS — I2510 Atherosclerotic heart disease of native coronary artery without angina pectoris: Secondary | ICD-10-CM

## 2016-08-26 NOTE — Progress Notes (Signed)
Cardiology Office Note Date:  08/26/2016   ID:  [name], DOB [birth year], MRN [id number]  PCP:  No PCP Per Patient  Cardiologist:  Sherren Mocha, MD    Chief Complaint  Patient presents with  . Coronary Artery Disease    3 month follow up     History of Present Illness: [name] is a 71 y.o. male who presents for follow-up evaluation. He initially presented in 2009 with an anterior MI complicated by cardiac arrest. He underwent primary PCI of the proximal LAD. He has ultimately undergone ICD placement for severe ischemic cardiomyopathy.  The patient is here with his wife today. He is actually doing quite well from a cardiac perspective. After undergoing cardiac catheterization last year he had some issues related to hypotension after titration of his heart failure regimen. Medicines were held and added back at lower dose and he has tolerated this well and is now feeling better. He is most limited by problems with peripheral neuropathy. He is not able to exercise much walking because of his legs and feet. He denies chest pain, chest pressure, lightheadedness, or syncope. He has chronic dyspnea with exertion and is a long time former smoker. He denies edema, orthopnea, PND, or heart palpitations.   Past Medical History:  Diagnosis Date  . CAD (coronary artery disease)    status post anterior MI 2008 w V. fib arrest // LHC 8/17: LM 30, pLAD stent ok, LCx ok, pRCA 10  . Carotid artery disease ([place])    Carotid US 12/13: Bilateral ICA 0-39%  . Dyslipidemia   . History of acute anterior wall MI 2009   s/p stenting to LAD  . Hypertension   . Ischemic cardiomyopathy    a. Echo 9/17: Mild LVH, EF 25-30, ant-septal, inf-septal, apical AK, apical inf AK, severe ant HK, Gr 1 DD, mildly dilated aortic root and ascending aorta (asc Ao 38 mm, Ao root 39 mm), mild reduced RVSF  . Macular degeneration   . Other vitamin B12 deficiency anemia 08/14/2013  . Polyneuropathy in other  diseases classified elsewhere ([place]) 02/12/2014  . Tremor, essential 02/03/2016  . Ventricular fibrillation [facility])    s/p ICD    Past Surgical History:  Procedure Laterality Date  . CARDIAC CATHETERIZATION  09/12/2007  . CARDIAC CATHETERIZATION N/A 03/26/2016   Procedure: Right/Left Heart Cath and Coronary Angiography;  Surgeon: Nelva Bush, MD;  Location: [place] CV LAB;  Service: Cardiovascular;  Laterality: N/A;  . CATARACT EXTRACTION Bilateral   . ICD medtronic implanted  07/06/08   Dr. Crissie Sickles    Current Outpatient Prescriptions  Medication Sig Dispense Refill  . aspirin (ASPIR-81) 81 MG EC tablet Take 81 mg by mouth daily.      . carvedilol (COREG) 25 MG tablet Take 0.5 tablets (12.5 mg total) by mouth 2 (two) times daily. 60 tablet 11  . digoxin (LANOXIN) 0.125 MG tablet TAKE 1 TABLET BY MOUTH DAILY. 30 tablet 11  . furosemide (LASIX) 40 MG tablet Take 1 tablet (40 mg total) by mouth daily. 30 tablet 5  . gabapentin (NEURONTIN) 300 MG capsule Take 1 capsule (300 mg total) by mouth as directed. Take 1 capsule by mouth in the morning and at lunch, take 3 capsules by mouth at night 150 capsule 5  . nitroGLYCERIN (NITROSTAT) 0.4 MG SL tablet Place 0.4 mg under the tongue every 5 (five) minutes as needed for chest pain (MAX 3 TABLETS).     . pantoprazole (PROTONIX) 40  MG tablet Take 1 tablet (40 mg total) by mouth daily. 30 tablet 11  . simvastatin (ZOCOR) 40 MG tablet TAKE 1 TABLET (40 MG TOTAL) BY MOUTH AT BEDTIME. 30 tablet 10  . vitamin B-12 (CYANOCOBALAMIN) 1000 MCG tablet Take 1,000 mcg by mouth daily.    Marland Kitchen losartan (COZAAR) 25 MG tablet Take 1 tablet (25 mg total) by mouth daily. 90 tablet 3   No current facility-administered medications for this visit.     Allergies:   Patient has no known allergies.   Social History:  The patient  reports that he has quit smoking. He has never used smokeless tobacco. He reports that he drinks about 4.2 oz of alcohol per week . He  reports that he does not use drugs.   Family History:  The patient's  family history includes Cancer - Colon in his sister; Heart attack in his father; Liver disease in his brother; Neuropathy in his sister and sister.    ROS:  Please see the history of present illness.   All other systems are reviewed and negative.    PHYSICAL EXAM: VS:  BP 132/68   Pulse 80   Ht 5' 11"  (1.803 m)   Wt 205 lb 6.4 oz (93.2 kg)   SpO2 95%   BMI 28.65 kg/m  , BMI Body mass index is 28.65 kg/m. GEN: Well nourished, well developed, in no acute distress  HEENT: normal  Neck: no JVD, no masses. No carotid bruits Cardiac: RRR without murmur or gallop                Respiratory:  clear to auscultation bilaterally, normal work of breathing GI: soft, nontender, nondistended, + BS MS: no deformity or atrophy  Ext: no pretibial edema, pedal pulses 2+= bilaterally Skin: warm and dry, no rash Neuro:  Strength and sensation are intact Psych: euthymic mood, full affect  EKG:  EKG is not ordered today.  Recent Labs: 03/24/2016: Hemoglobin 14.0; Platelets 213 06/01/2016: BUN 15; Creat 1.30; Potassium 4.1; Sodium 139   Lipid Panel     Component Value Date/Time   CHOL 133 06/24/2015 0748   TRIG 155 (H) 06/24/2015 0748   HDL 41 06/24/2015 0748   CHOLHDL 3.2 06/24/2015 0748   VLDL 31 (H) 06/24/2015 0748   LDLCALC 61 06/24/2015 0748      Wt Readings from Last 3 Encounters:  08/26/16 205 lb 6.4 oz (93.2 kg)  08/06/16 206 lb (93.4 kg)  05/25/16 196 lb 12.8 oz (89.3 kg)     Cardiac Studies Reviewed: Cardiac Cath 03-26-2016: Procedures   Right/Left Heart Cath and Coronary Angiography  Conclusion    Widely patent stent in ostial/proximal LAD.  Mild to moderate, non-obstructive coronary artery disease involving proximal RCA and distal LMCA.  Mildly elevated left ventricular filling pressure, right ventricular filling pressure, and pulmonary artery pressure.  Normal Fick Cardiac  output.  Plan:  Continue aggressive secondary prevention and medical therapy, including escalation of afterload reduction given systemic hypertension and elevated filling pressures.  Outpatient follow-up with Dr. Burt Knack as previously planned.   2D Echo 04-09-2016: Study Conclusions  - Left ventricle: The cavity size was normal. Wall thickness was   increased in a pattern of mild LVH. Systolic function was   severely reduced. The estimated ejection fraction was in the   range of 25% to 30%. Anteroseptal, inferoseptal, apical akinesis.   Akinesis of the apical inferior wall. Severe anterior   hypokinesis. Doppler parameters are consistent with abnormal left  ventricular relaxation (grade 1 diastolic dysfunction). - Aortic valve: There was no stenosis. There was trivial   regurgitation. - Aorta: Mildly dilated aortic root and ascending aorta. Ascending   aorta dimension: 38 mm. Aortic root dimension: 39 mm (ED). - Mitral valve: There was no significant regurgitation. - Right ventricle: The cavity size was normal. Pacer wire or   catheter noted in right ventricle. Systolic function was mildly   reduced. - Pulmonary arteries: No complete TR doppler jet so unable to   estimate PA systolic pressure. - Inferior vena cava: The vessel was normal in size. The   respirophasic diameter changes were in the normal range (= 50%),   consistent with normal central venous pressure.  Impressions:  - Normal LV size with mild LV hypertrophy. EF 25-30%, wall motion   abnormalities as noted above. Normal RV size with mildly   decreased systolic function. No significant valvular   abnormalities.   ASSESSMENT AND PLAN: 1.  Chronic systolic heart failure, NYHA functional class II: The patient's medications are reviewed and he is tolerating losartan and carvedilol at current doses. He's feeling much better with reduction in his medications. We'll continue his same program.  2. Coronary artery  disease, native vessel, with old MI: The patient has no anginal symptoms. Last use cardiac catheterization reviewed with no areas of high-grade stenosis. Continue medical management.  3. Hyperlipidemia: Lifestyle modification reviewed at length with the patient. He will need to focus on dietary changes as he is really not able to exercise much because of his neuropathy. He continues on a statin drug. Will update lipids and LFTs. Last labs from 2016 reviewed.  4. Ischemic cardiomyopathy: s/p ICD, LVEF <30%. No ICD discharges. Followed by EP team.   Current medicines are reviewed with the patient today.  The patient does not have concerns regarding medicines.  Labs/ tests ordered today include:   Orders Placed This Encounter  Procedures  . Lipid panel  . Comp Met (CMET)    Disposition:   FU 6 months. Will update labs.   Deatra James, MD  08/26/2016 10:08 AM    [facility] [place], [place], [place]  [address] Phone: [phone]; Fax: [phone]

## 2016-08-26 NOTE — Patient Instructions (Addendum)
Medication Instructions:  Your physician recommends that you continue on your current medications as directed. Please refer to the Current Medication list given to you today.  Labwork: Your physician recommends that you have lab work today: CMP and LIPID  Testing/Procedures: No new orders.   Follow-Up: Your physician wants you to follow-up in: 6 MONTHS with Dr Cooper.  You will receive a reminder letter in the mail two months in advance. If you don't receive a letter, please call our office to schedule the follow-up appointment.   Any Other Special Instructions Will Be Listed Below (If Applicable).     If you need a refill on your cardiac medications before your next appointment, please call your pharmacy.   

## 2016-08-27 LAB — LIPID PANEL
CHOLESTEROL TOTAL: 146 mg/dL (ref 100–199)
Chol/HDL Ratio: 4.3 ratio units (ref 0.0–5.0)
HDL: 34 mg/dL — ABNORMAL LOW (ref >39)
LDL Calculated: 70 mg/dL (ref 0–99)
Triglycerides: 208 mg/dL — ABNORMAL HIGH (ref 0–149)
VLDL Cholesterol Cal: 42 mg/dL — ABNORMAL HIGH (ref 5–40)

## 2016-08-27 LAB — COMPREHENSIVE METABOLIC PANEL
ALBUMIN: 3.7 g/dL (ref 3.6–4.8)
ALT: 26 IU/L (ref 0–44)
AST: 22 IU/L (ref 0–40)
Albumin/Globulin Ratio: 1.2 (ref 1.2–2.2)
Alkaline Phosphatase: 90 IU/L (ref 39–117)
BUN / CREAT RATIO: 12 (ref 10–24)
BUN: 18 mg/dL (ref 8–27)
Bilirubin Total: 0.5 mg/dL (ref 0.0–1.2)
CALCIUM: 8.9 mg/dL (ref 8.6–10.2)
CO2: 25 mmol/L (ref 18–29)
CREATININE: 1.48 mg/dL — AB (ref 0.76–1.27)
Chloride: 100 mmol/L (ref 96–106)
GFR calc Af Amer: 55 mL/min/{1.73_m2} — ABNORMAL LOW (ref >59)
GFR, EST NON AFRICAN AMERICAN: 48 mL/min/{1.73_m2} — AB (ref >59)
GLOBULIN, TOTAL: 3 g/dL (ref 1.5–4.5)
GLUCOSE: 111 mg/dL — AB (ref 65–99)
Potassium: 5 mmol/L (ref 3.5–5.2)
SODIUM: 141 mmol/L (ref 134–144)
Total Protein: 6.7 g/dL (ref 6.0–8.5)

## 2016-09-20 ENCOUNTER — Other Ambulatory Visit: Payer: Self-pay | Admitting: Cardiovascular Disease

## 2016-09-23 ENCOUNTER — Telehealth: Payer: Self-pay | Admitting: Neurology

## 2016-09-23 MED ORDER — GABAPENTIN 400 MG PO CAPS: ORAL_CAPSULE | ORAL | 1 refills | Status: DC

## 2016-09-23 NOTE — Telephone Encounter (Signed)
Patients wife (listed on DPR) called office in reference to gabapentin (NEURONTIN) 300 MG capsule.  Patient would like to see about getting the strength increased.  [facility] [address]  Please call

## 2016-09-23 NOTE — Telephone Encounter (Signed)
Dr Anne HahnWillis- please advise. Patient last seen 08/06/16.

## 2016-09-23 NOTE — Addendum Note (Signed)
Addended by: Stephanie AcreWILLIS, CHARLES on: 09/23/2016 01:38 PM   Modules accepted: Orders

## 2016-09-23 NOTE — Telephone Encounter (Signed)
I called the wife. The patient is having ongoing discomfort on the gabapentin, he believes that he can take a higher dose. I will send and up her prescription for the 400 mg gabapentin capsules, he'll take 5 daily.

## 2016-10-02 ENCOUNTER — Telehealth: Payer: Self-pay | Admitting: Internal Medicine

## 2016-10-02 ENCOUNTER — Ambulatory Visit (INDEPENDENT_AMBULATORY_CARE_PROVIDER_SITE_OTHER): Payer: Medicare Other | Admitting: Internal Medicine

## 2016-10-02 ENCOUNTER — Encounter: Payer: Self-pay | Admitting: Internal Medicine

## 2016-10-02 VITALS — BP 126/58 | HR 79 | Ht 71.0 in | Wt 208.4 lb

## 2016-10-02 DIAGNOSIS — I255 Ischemic cardiomyopathy: Secondary | ICD-10-CM | POA: Diagnosis not present

## 2016-10-02 DIAGNOSIS — I5022 Chronic systolic (congestive) heart failure: Secondary | ICD-10-CM | POA: Diagnosis not present

## 2016-10-02 DIAGNOSIS — Z95810 Presence of automatic (implantable) cardiac defibrillator: Secondary | ICD-10-CM

## 2016-10-02 DIAGNOSIS — Z01812 Encounter for preprocedural laboratory examination: Secondary | ICD-10-CM

## 2016-10-02 LAB — CUP PACEART INCLINIC DEVICE CHECK
Brady Statistic RV Percent Paced: 0 %
HIGH POWER IMPEDANCE MEASURED VALUE: 45 Ohm
HIGH POWER IMPEDANCE MEASURED VALUE: 46 Ohm
HIGH POWER IMPEDANCE MEASURED VALUE: 48 Ohm
HIGH POWER IMPEDANCE MEASURED VALUE: 48 Ohm
HIGH POWER IMPEDANCE MEASURED VALUE: 49 Ohm
HIGH POWER IMPEDANCE MEASURED VALUE: 50 Ohm
HIGH POWER IMPEDANCE MEASURED VALUE: 50 Ohm
HIGH POWER IMPEDANCE MEASURED VALUE: 50 Ohm
HIGH POWER IMPEDANCE MEASURED VALUE: 52 Ohm
HIGH POWER IMPEDANCE MEASURED VALUE: 54 Ohm
HIGH POWER IMPEDANCE MEASURED VALUE: 56 Ohm
HIGH POWER IMPEDANCE MEASURED VALUE: 60 Ohm
HIGH POWER IMPEDANCE MEASURED VALUE: 60 Ohm
HIGH POWER IMPEDANCE MEASURED VALUE: 61 Ohm
HIGH POWER IMPEDANCE MEASURED VALUE: 68 Ohm
HIGH POWER IMPEDANCE MEASURED VALUE: 70 Ohm
HIGH POWER IMPEDANCE MEASURED VALUE: 71 Ohm
HIGH POWER IMPEDANCE MEASURED VALUE: 74 Ohm
HighPow Impedance: 45 Ohm
HighPow Impedance: 47 Ohm
HighPow Impedance: 48 Ohm
HighPow Impedance: 52 Ohm
HighPow Impedance: 52 Ohm
HighPow Impedance: 54 Ohm
HighPow Impedance: 56 Ohm
HighPow Impedance: 58 Ohm
HighPow Impedance: 61 Ohm
HighPow Impedance: 63 Ohm
HighPow Impedance: 64 Ohm
HighPow Impedance: 65 Ohm
HighPow Impedance: 67 Ohm
Implantable Lead Implant Date: 20091204
Implantable Lead Location: 753860
Implantable Pulse Generator Implant Date: 20091204
Lead Channel Impedance Value: 464 Ohm
Lead Channel Impedance Value: 480 Ohm
Lead Channel Impedance Value: 504 Ohm
Lead Channel Impedance Value: 528 Ohm
Lead Channel Impedance Value: 544 Ohm
Lead Channel Impedance Value: 576 Ohm
Lead Channel Sensing Intrinsic Amplitude: 10.3 mV
Lead Channel Sensing Intrinsic Amplitude: 10.5 mV
Lead Channel Sensing Intrinsic Amplitude: 11.5 mV
Lead Channel Sensing Intrinsic Amplitude: 12.7 mV
Lead Channel Sensing Intrinsic Amplitude: 9.6 mV
Lead Channel Setting Pacing Pulse Width: 0.4 ms
Lead Channel Setting Sensing Sensitivity: 0.6 mV
MDC IDC MSMT BATTERY VOLTAGE: 2.62 V
MDC IDC MSMT LEADCHNL RV IMPEDANCE VALUE: 496 Ohm
MDC IDC MSMT LEADCHNL RV IMPEDANCE VALUE: 504 Ohm
MDC IDC MSMT LEADCHNL RV IMPEDANCE VALUE: 504 Ohm
MDC IDC MSMT LEADCHNL RV IMPEDANCE VALUE: 512 Ohm
MDC IDC MSMT LEADCHNL RV IMPEDANCE VALUE: 512 Ohm
MDC IDC MSMT LEADCHNL RV IMPEDANCE VALUE: 512 Ohm
MDC IDC MSMT LEADCHNL RV IMPEDANCE VALUE: 528 Ohm
MDC IDC MSMT LEADCHNL RV IMPEDANCE VALUE: 536 Ohm
MDC IDC MSMT LEADCHNL RV IMPEDANCE VALUE: 584 Ohm
MDC IDC MSMT LEADCHNL RV PACING THRESHOLD AMPLITUDE: 1 V
MDC IDC MSMT LEADCHNL RV PACING THRESHOLD PULSEWIDTH: 0.4 ms
MDC IDC MSMT LEADCHNL RV SENSING INTR AMPL: 10.3 mV
MDC IDC MSMT LEADCHNL RV SENSING INTR AMPL: 10.3 mV
MDC IDC MSMT LEADCHNL RV SENSING INTR AMPL: 10.5 mV
MDC IDC MSMT LEADCHNL RV SENSING INTR AMPL: 10.6 mV
MDC IDC MSMT LEADCHNL RV SENSING INTR AMPL: 10.8 mV
MDC IDC MSMT LEADCHNL RV SENSING INTR AMPL: 11 mV
MDC IDC MSMT LEADCHNL RV SENSING INTR AMPL: 11.1 mV
MDC IDC MSMT LEADCHNL RV SENSING INTR AMPL: 11.3 mV
MDC IDC MSMT LEADCHNL RV SENSING INTR AMPL: 9.6 mV
MDC IDC MSMT LEADCHNL RV SENSING INTR AMPL: 9.9 mV
MDC IDC SESS DTM: 20180302131753
MDC IDC SET LEADCHNL RV PACING AMPLITUDE: 2.5 V

## 2016-10-02 NOTE — Telephone Encounter (Signed)
New Message    Please try to schedule his procedure on 4/13 or 4/20 at 930a if possible, this would be more convenient for them

## 2016-10-02 NOTE — Patient Instructions (Addendum)
Medication Instructions:  Your physician recommends that you continue on your current medications as directed. Please refer to the Current Medication list given to you today.   Labwork: BMET / CBC w/ diff - 1-2 weeks prior to procedure   Testing/Procedures: ICD Generator Change  Possible Dates: 4/10, 4/13, 4/16, 4/20, 4/25, 4/27, 5/2, 5/4, 5/9, 5/11, 5/21, 5/24, 5/29  Please call to schedule - [phone]    Follow-Up: To be determined  Any Other Special Instructions Will Be Listed Below (If Applicable).     If you need a refill on your cardiac medications before your next appointment, please call your pharmacy.

## 2016-10-02 NOTE — Progress Notes (Signed)
HPI Mr. Paradiso returns today for followup. He is a very pleasant 71 year old man with an ischemic cardiomyopathy, chronic systolic heart failure, status post ventricular fibrillation arrest, status post ICD implantation. He also is a history of hypertension. The patient has been stable over the last several months. He denies syncope, chest pain, peripheral edema, or ICD shock. 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 He feels well. He has reached ERI on his device. Last EF was 25%. He has a secondary prevention device. No Known Allergies   Current Outpatient Prescriptions  Medication Sig Dispense Refill  . aspirin (ASPIR-81) 81 MG EC tablet Take 81 mg by mouth daily.      . carvedilol (COREG) 25 MG tablet Take 0.5 tablets (12.5 mg total) by mouth 2 (two) times daily. 60 tablet 11  . digoxin (LANOXIN) 0.125 MG tablet TAKE 1 TABLET BY MOUTH DAILY. 30 tablet 11  . furosemide (LASIX) 40 MG tablet TAKE 1 TABLET (40 MG TOTAL) BY MOUTH DAILY. 30 tablet 11  . gabapentin (NEURONTIN) 400 MG capsule 2 in the morning, 1 at midday, 2 in the evening 450 capsule 1  . losartan (COZAAR) 25 MG tablet Take 1 tablet (25 mg total) by mouth daily. 90 tablet 3  . nitroGLYCERIN (NITROSTAT) 0.4 MG SL tablet Place 0.4 mg under the tongue every 5 (five) minutes as needed for chest pain (MAX 3 TABLETS).     . pantoprazole (PROTONIX) 40 MG tablet Take 1 tablet (40 mg total) by mouth daily. 30 tablet 11  . simvastatin (ZOCOR) 40 MG tablet TAKE 1 TABLET (40 MG TOTAL) BY MOUTH AT BEDTIME. 30 tablet 10  . vitamin B-12 (CYANOCOBALAMIN) 1000 MCG tablet Take 1,000 mcg by mouth daily.     No current facility-administered medications for this visit.      Past Medical History:  Diagnosis Date  . CAD (coronary artery disease)    status post anterior MI 2008 w V. fib arrest // LHC 8/17: LM 30, pLAD stent ok, LCx ok, pRCA 10  . Carotid artery disease (HCC)    Carotid US 12/13: Bilateral ICA 0-39%  . Dyslipidemia   . History of  acute anterior wall MI 2009   s/p stenting to LAD  . Hypertension   . Ischemic cardiomyopathy    a. Echo 9/17: Mild LVH, EF 25-30, ant-septal, inf-septal, apical AK, apical inf AK, severe ant HK, Gr 1 DD, mildly dilated aortic root and ascending aorta (asc Ao 38 mm, Ao root 39 mm), mild reduced RVSF  . Macular degeneration   . Other vitamin B12 deficiency anemia 08/14/2013  . Polyneuropathy in other diseases classified elsewhere (HCC) 02/12/2014  . Tremor, essential 02/03/2016  . Ventricular fibrillation (HCC)    s/p ICD    ROS:   All systems reviewed and negative except as noted in the HPI.   Past Surgical History:  Procedure Laterality Date  . CARDIAC CATHETERIZATION  09/12/2007  . CARDIAC CATHETERIZATION N/A 03/26/2016   Procedure: Right/Left Heart Cath and Coronary Angiography;  Surgeon: Yvonne Kendallhristopher End, MD;  [facility];  Service: Cardiovascular;  Laterality: N/A;  . CATARACT EXTRACTION Bilateral   . ICD medtronic implanted  07/06/08   Dr. Sharrell KuGreg Yigit Norkus     Family History  Problem Relation Age of Onset  . Heart attack Father   . Cancer - Colon Sister   . Liver disease Brother   . Neuropathy Sister   . Neuropathy Sister      Social History  Social History  . Marital status: Married    Spouse name: [name]  . Number of children: 4  . Years of education: HS   Occupational History  . Retired [employer]   Social History Main Topics  . Smoking status: Former Games developer  . Smokeless tobacco: Never Used     Comment: QUIT 09/2007  . Alcohol use 4.2 oz/week    7 Standard drinks or equivalent per week     Comment: beer  . Drug use: No  . Sexual activity: Not on file   Other Topics Concern  . Not on file   Social History Narrative   Patient lives at home with his wife [name]).   Retired patient works part Scientific laboratory technician work.   Education high school.   Left handed.   Caffeine one cup of coffee daily.      BP (!) 126/58   Pulse 79   Ht 5\' 11"   (1.803 m)   Wt 208 lb 6.4 oz (94.5 kg)   BMI 29.07 kg/m  130/82 by me Physical Exam:  Well appearing 71 year old man,NAD HEENT: Unremarkable Neck:  6 cm JVD, no thyromegally Lungs:  Clear with no wheezes, rales, or rhonchi. HEART:  Regular rate rhythm, no murmurs, no rubs, no clicks Abd:  soft, positive bowel sounds, no organomegally, no rebound, no guarding Ext:  2 plus pulses, no edema, no cyanosis, no clubbing Skin:  No rashes no nodules Neuro:  CN II through XII intact, motor grossly intact  DEVICE  Normal device function.  See PaceArt for details.   Assess/Plan: 1. VF arrest - he has had no recurrent ventricular arrhythmias. No change in meds. 2. Chronic systolic heart failure - his symptoms are class 2A. He will continue his current meds. He is encouraged to maintain a low sodium diet. EF was 25% by echo about 7 months ago 3. ICD - his medtronic device is working normally. Will recheck in several months. He has reached ERI. Will schedule for ICD generator change out.  Leonia Reeves.D.

## 2016-10-05 NOTE — Telephone Encounter (Signed)
Called, spoke with pt's wife, [name]. Scheduled pt for ICD Generator Change on 11/13/16 [facility]:30 [facility]. Reviewed ALL information on patient information letter r/t generator change. Mailed letter today. Pt [facility]05/[facility]. Informed pt will need to be scheduled for wound check 10-14 days after procedure and 91 days from procedure with Dr. Ladona Ridgelaylor. Wife verbalized understanding.

## 2016-10-06 ENCOUNTER — Telehealth: Payer: Self-pay | Admitting: *Deleted

## 2016-10-06 NOTE — Telephone Encounter (Signed)
Called and spoke with Adela LankJacqueline (pharmacist) from [facility]. Received fax wanting clarification on whether patient is taking 300mg  capsule or 400mg  capsule or both. Clarified per CW,MD note that new rx called in for 400mg , 2 capsules in the morning, 1 capsule midday, 2 caps in the evening. She will d/c 300mg  capsule. She verified she has 400mg  capsule on file.

## 2016-10-29 NOTE — Telephone Encounter (Signed)
Informed wife (DPR on file) that pt needs to arrive at the hospital at 7:30 on 4/13 for ICD generator change. Wife verbalized understanding and agreeable to plan.

## 2016-11-05 ENCOUNTER — Other Ambulatory Visit: Payer: Medicare Other

## 2016-11-06 ENCOUNTER — Other Ambulatory Visit: Payer: Medicare Other | Admitting: *Deleted

## 2016-11-06 DIAGNOSIS — I4901 Ventricular fibrillation: Secondary | ICD-10-CM

## 2016-11-06 DIAGNOSIS — I10 Essential (primary) hypertension: Secondary | ICD-10-CM

## 2016-11-06 LAB — BASIC METABOLIC PANEL
BUN / CREAT RATIO: 10 (ref 10–24)
BUN: 13 mg/dL (ref 8–27)
CHLORIDE: 100 mmol/L (ref 96–106)
CO2: 23 mmol/L (ref 18–29)
Calcium: 8.9 mg/dL (ref 8.6–10.2)
Creatinine, Ser: 1.28 mg/dL — ABNORMAL HIGH (ref 0.76–1.27)
GFR calc Af Amer: 66 mL/min/{1.73_m2} (ref >59)
GFR calc non Af Amer: 57 mL/min/{1.73_m2} — ABNORMAL LOW (ref >59)
GLUCOSE: 116 mg/dL — AB (ref 65–99)
POTASSIUM: 4.7 mmol/L (ref 3.5–5.2)
SODIUM: 141 mmol/L (ref 134–144)

## 2016-11-06 LAB — CBC WITH DIFFERENTIAL/PLATELET
Basophils Absolute: 0 10*3/uL (ref 0.0–0.2)
Basos: 0 %
EOS (ABSOLUTE): 0.1 10*3/uL (ref 0.0–0.4)
Eos: 1 %
Hematocrit: 42.3 % (ref 37.5–51.0)
Hemoglobin: 14.7 g/dL (ref 13.0–17.7)
IMMATURE GRANULOCYTES: 0 %
Immature Grans (Abs): 0 10*3/uL (ref 0.0–0.1)
LYMPHS ABS: 2.1 10*3/uL (ref 0.7–3.1)
Lymphs: 29 %
MCH: 31.7 pg (ref 26.6–33.0)
MCHC: 34.8 g/dL (ref 31.5–35.7)
MCV: 91 fL (ref 79–97)
MONOS ABS: 0.6 10*3/uL (ref 0.1–0.9)
Monocytes: 8 %
Neutrophils Absolute: 4.4 10*3/uL (ref 1.4–7.0)
Neutrophils: 62 %
PLATELETS: 189 10*3/uL (ref 150–379)
RBC: 4.63 x10E6/uL (ref 4.14–5.80)
RDW: 12.8 % (ref 12.3–15.4)
WBC: 7.1 10*3/uL (ref 3.4–10.8)

## 2016-11-06 NOTE — Telephone Encounter (Signed)
Updated patient and advised to arrive at 8:00 on 4/13 for his procedure. Patient verbalized understanding and agreeable to plan.

## 2016-11-13 ENCOUNTER — Ambulatory Visit (HOSPITAL_COMMUNITY)
Admission: RE | Admit: 2016-11-13 | Discharge: 2016-11-13 | Disposition: A | Discharge status: Home / Self Care | Payer: Medicare Other | Source: Ambulatory Visit | Attending: Internal Medicine | Admitting: Internal Medicine

## 2016-11-13 ENCOUNTER — Telehealth: Payer: Self-pay | Admitting: Cardiology

## 2016-11-13 ENCOUNTER — Encounter (HOSPITAL_COMMUNITY): Payer: Self-pay | Admitting: Internal Medicine

## 2016-11-13 ENCOUNTER — Encounter (HOSPITAL_COMMUNITY)
Admission: RE | Disposition: A | Discharge status: Home / Self Care | Payer: Self-pay | Source: Ambulatory Visit | Attending: Internal Medicine

## 2016-11-13 DIAGNOSIS — I110 Hypertensive heart disease with heart failure: Secondary | ICD-10-CM | POA: Insufficient documentation

## 2016-11-13 DIAGNOSIS — I255 Ischemic cardiomyopathy: Secondary | ICD-10-CM | POA: Insufficient documentation

## 2016-11-13 DIAGNOSIS — Z7982 Long term (current) use of aspirin: Secondary | ICD-10-CM | POA: Insufficient documentation

## 2016-11-13 DIAGNOSIS — I2510 Atherosclerotic heart disease of native coronary artery without angina pectoris: Secondary | ICD-10-CM | POA: Insufficient documentation

## 2016-11-13 DIAGNOSIS — E785 Hyperlipidemia, unspecified: Secondary | ICD-10-CM | POA: Insufficient documentation

## 2016-11-13 DIAGNOSIS — I4901 Ventricular fibrillation: Secondary | ICD-10-CM | POA: Diagnosis not present

## 2016-11-13 DIAGNOSIS — I5022 Chronic systolic (congestive) heart failure: Secondary | ICD-10-CM | POA: Insufficient documentation

## 2016-11-13 DIAGNOSIS — Z4502 Encounter for adjustment and management of automatic implantable cardiac defibrillator: Secondary | ICD-10-CM | POA: Insufficient documentation

## 2016-11-13 DIAGNOSIS — H3530 Unspecified macular degeneration: Secondary | ICD-10-CM | POA: Diagnosis not present

## 2016-11-13 DIAGNOSIS — Z955 Presence of coronary angioplasty implant and graft: Secondary | ICD-10-CM | POA: Diagnosis not present

## 2016-11-13 DIAGNOSIS — Z87891 Personal history of nicotine dependence: Secondary | ICD-10-CM | POA: Insufficient documentation

## 2016-11-13 DIAGNOSIS — I252 Old myocardial infarction: Secondary | ICD-10-CM | POA: Insufficient documentation

## 2016-11-13 DIAGNOSIS — G250 Essential tremor: Secondary | ICD-10-CM | POA: Insufficient documentation

## 2016-11-13 HISTORY — PX: ICD GENERATOR CHANGEOUT: EP1231

## 2016-11-13 LAB — SURGICAL PCR SCREEN
MRSA, PCR: NEGATIVE
STAPHYLOCOCCUS AUREUS: NEGATIVE

## 2016-11-13 SURGERY — ICD GENERATOR CHANGEOUT

## 2016-11-13 MED ORDER — CHLORHEXIDINE GLUCONATE 4 % EX LIQD
60.0000 mL | Freq: Once | CUTANEOUS | Status: DC
  Filled 2016-11-13: qty 60

## 2016-11-13 MED ORDER — MIDAZOLAM HCL 5 MG/5ML IJ SOLN
INTRAMUSCULAR | Status: DC | PRN
  Administered 2016-11-13 (×3): 1 mg via INTRAVENOUS

## 2016-11-13 MED ORDER — SODIUM CHLORIDE 0.9 % IR SOLN
80.0000 mg | Status: AC
  Administered 2016-11-13: 80 mg
  Filled 2016-11-13: qty 2

## 2016-11-13 MED ORDER — SODIUM CHLORIDE 0.9 % IR SOLN
Status: AC
  Filled 2016-11-13: qty 2

## 2016-11-13 MED ORDER — MUPIROCIN 2 % EX OINT
TOPICAL_OINTMENT | CUTANEOUS | Status: AC
  Administered 2016-11-13: 1 via TOPICAL
  Filled 2016-11-13: qty 22

## 2016-11-13 MED ORDER — LIDOCAINE HCL (PF) 1 % IJ SOLN
INTRAMUSCULAR | Status: DC | PRN
  Administered 2016-11-13: 45 mL via INTRADERMAL

## 2016-11-13 MED ORDER — MUPIROCIN 2 % EX OINT
1.0000 "application " | TOPICAL_OINTMENT | Freq: Once | CUTANEOUS | Status: AC
  Administered 2016-11-13: 1 via TOPICAL
  Filled 2016-11-13: qty 22

## 2016-11-13 MED ORDER — ONDANSETRON HCL 4 MG/2ML IJ SOLN: 4.0000 mg | Freq: Four times a day (QID) | INTRAMUSCULAR | Status: DC | PRN

## 2016-11-13 MED ORDER — LIDOCAINE HCL (PF) 1 % IJ SOLN
INTRAMUSCULAR | Status: AC
  Filled 2016-11-13: qty 30

## 2016-11-13 MED ORDER — FENTANYL CITRATE (PF) 100 MCG/2ML IJ SOLN
INTRAMUSCULAR | Status: DC | PRN
  Administered 2016-11-13: 12.5 ug via INTRAVENOUS
  Administered 2016-11-13: 25 ug via INTRAVENOUS

## 2016-11-13 MED ORDER — ACETAMINOPHEN 325 MG PO TABS
325.0000 mg | ORAL_TABLET | ORAL | Status: DC | PRN
  Filled 2016-11-13: qty 2

## 2016-11-13 MED ORDER — MIDAZOLAM HCL 5 MG/5ML IJ SOLN
INTRAMUSCULAR | Status: AC
  Filled 2016-11-13: qty 5

## 2016-11-13 MED ORDER — FENTANYL CITRATE (PF) 100 MCG/2ML IJ SOLN
INTRAMUSCULAR | Status: AC
  Filled 2016-11-13: qty 2

## 2016-11-13 MED ORDER — CEFAZOLIN SODIUM-DEXTROSE 2-4 GM/100ML-% IV SOLN
INTRAVENOUS | Status: AC
  Filled 2016-11-13: qty 100

## 2016-11-13 MED ORDER — CEFAZOLIN SODIUM-DEXTROSE 2-4 GM/100ML-% IV SOLN
2.0000 g | INTRAVENOUS | Status: AC
  Administered 2016-11-13: 2 g via INTRAVENOUS
  Filled 2016-11-13: qty 100

## 2016-11-13 MED ORDER — SODIUM CHLORIDE 0.9 % IV SOLN
INTRAVENOUS | Status: DC
  Administered 2016-11-13: 09:00:00 via INTRAVENOUS

## 2016-11-13 SURGICAL SUPPLY — 5 items
CABLE SURGICAL S-101-97-12 (CABLE) ×3 IMPLANT
ICD VISIA AF VR DVAB1D1 (ICD Generator) ×1 IMPLANT
PAD DEFIB LIFELINK (PAD) ×3 IMPLANT
TRAY PACEMAKER INSERTION (PACKS) ×3 IMPLANT
VISIA AF VR DVAB1D1 (ICD Generator) ×3 IMPLANT

## 2016-11-13 NOTE — Telephone Encounter (Signed)
Opened in error

## 2016-11-13 NOTE — H&P (Signed)
HPI [name] returns today for followup. He is a very pleasant 71 year old man with an ischemic cardiomyopathy, chronic systolic heart failure, status post ventricular fibrillation arrest, status post ICD implantation. He also is a history of hypertension. The patient has been stable over the last several months. He denies syncope, chest pain, peripheral edema, or ICD shock. 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 He has reached ERI on his device. Last EF was 25%. He has a secondary prevention device. No Known Allergies         Current Outpatient Prescriptions  Medication Sig Dispense Refill  . aspirin (ASPIR-81) 81 MG EC tablet Take 81 mg by mouth daily.      . carvedilol (COREG) 25 MG tablet Take 0.5 tablets (12.5 mg total) by mouth 2 (two) times daily. 60 tablet 11  . digoxin (LANOXIN) 0.125 MG tablet TAKE 1 TABLET BY MOUTH DAILY. 30 tablet 11  . furosemide (LASIX) 40 MG tablet TAKE 1 TABLET (40 MG TOTAL) BY MOUTH DAILY. 30 tablet 11  . gabapentin (NEURONTIN) 400 MG capsule 2 in the morning, 1 at midday, 2 in the evening 450 capsule 1  . losartan (COZAAR) 25 MG tablet Take 1 tablet (25 mg total) by mouth daily. 90 tablet 3  . nitroGLYCERIN (NITROSTAT) 0.4 MG SL tablet Place 0.4 mg under the tongue every 5 (five) minutes as needed for chest pain (MAX 3 TABLETS).     . pantoprazole (PROTONIX) 40 MG tablet Take 1 tablet (40 mg total) by mouth daily. 30 tablet 11  . simvastatin (ZOCOR) 40 MG tablet TAKE 1 TABLET (40 MG TOTAL) BY MOUTH AT BEDTIME. 30 tablet 10  . vitamin B-12 (CYANOCOBALAMIN) 1000 MCG tablet Take 1,000 mcg by mouth daily.     No current facility-administered medications for this visit.          Past Medical History:  Diagnosis Date  . CAD (coronary artery disease)    status post anterior MI 2008 w V. fib arrest // LHC 8/17: LM 30, pLAD stent ok, LCx ok, pRCA 10  . Carotid artery disease (HCC)    Carotid US 12/13: Bilateral ICA 0-39%   . Dyslipidemia   . History of acute anterior wall MI 2009   s/p stenting to LAD  . Hypertension   . Ischemic cardiomyopathy    a. Echo 9/17: Mild LVH, EF 25-30, ant-septal, inf-septal, apical AK, apical inf AK, severe ant HK, Gr 1 DD, mildly dilated aortic root and ascending aorta (asc Ao 38 mm, Ao root 39 mm), mild reduced RVSF  . Macular degeneration   . Other vitamin B12 deficiency anemia 08/14/2013  . Polyneuropathy in other diseases classified elsewhere (HCC) 02/12/2014  . Tremor, essential 02/03/2016  . Ventricular fibrillation (HCC)    s/p ICD    ROS:   All systems reviewed and negative except as noted in the HPI.        Past Surgical History:  Procedure Laterality Date  . CARDIAC CATHETERIZATION  09/12/2007  . CARDIAC CATHETERIZATION N/A 03/26/2016   Procedure: Right/Left Heart Cath and Coronary Angiography;  Surgeon: Yvonne Kendall, MD;  Location: [facility] INVASIVE CV LAB;  Service: Cardiovascular;  Laterality: N/A;  . CATARACT EXTRACTION Bilateral   . ICD medtronic implanted  07/06/08   Dr. Sharrell Ku          Family History  Problem Relation Age of Onset  . Heart attack Father   . Cancer - Colon Sister   . Liver  disease Brother   . Neuropathy Sister   . Neuropathy Sister      Social History        Social History  . Marital status: Married    Spouse name: [name]  . Number of children: 4  . Years of education: HS       Occupational History  . Retired [employer]         Social History Main Topics  . Smoking status: Former Games developer  . Smokeless tobacco: Never Used     Comment: QUIT 09/2007  . Alcohol use 4.2 oz/week    7 Standard drinks or equivalent per week     Comment: beer  . Drug use: No  . Sexual activity: Not on file       Other Topics Concern  . Not on file      Social History Narrative   Patient lives at home with his wife [name]).   Retired patient works part Scientific laboratory technician work.    Education high school.   Left handed.   Caffeine one cup of coffee daily.      BP (!) 126/58   Pulse 79   Ht  (1.803 m)   Wt 208 lb 6.4 oz (94.5 kg)   BMI 29.07 kg/m  130/82 by me Physical Exam:  Well appearing 71 year old man,NAD HEENT: Unremarkable Neck:  6 cm JVD, no thyromegally Lungs:  Clear with no wheezes, rales, or rhonchi. HEART:  Regular rate rhythm, no murmurs, no rubs, no clicks Abd:  soft, positive bowel sounds, no organomegally, no rebound, no guarding Ext:  2 plus pulses, no edema, no cyanosis, no clubbing Skin:  No rashes no nodules Neuro:  CN II through XII intact, motor grossly intact  DEVICE  Normal device function.  See PaceArt for details.   Assess/Plan: 1. VF arrest - he has had no recurrent ventricular arrhythmias. No change in meds. 2. Chronic systolic heart failure - his symptoms are class 2A. He will continue his current meds. He is encouraged to maintain a low sodium diet. EF was 25% by echo about 7 months ago 3. ICD - his medtronic device is working normally. Will recheck in several months. He has reached ERI. Will schedule for ICD generator change out.  [name].  EP Attending  Patient seen and examined. Agree with above. Will plan to proceed with ICD generator removal and insertion of a new device.  [name].D.

## 2016-11-13 NOTE — Progress Notes (Signed)
Pt ambulated with cane to bathroom, voided and ambulated without difficulty.

## 2016-11-13 NOTE — Discharge Instructions (Signed)

## 2016-11-16 ENCOUNTER — Telehealth: Payer: Self-pay | Admitting: Internal Medicine

## 2016-11-16 NOTE — Telephone Encounter (Signed)
Patient wife calling states that her husband had double bandage over the incision, where the device was installed. Patient took off the outer layer of the bandage but states that there was a smaller bandage underneath. Patient wife would like to know if they should remove the smaller bandage? Please call to discuss,thanks.

## 2016-11-18 NOTE — Telephone Encounter (Signed)
Spoke to wife about patient's device site. I explained to her that the outer covering (tegaderm dressing) should have been removed by this point, but that the steri strips needed to remain in place until patient's ov on 4/23. Wife verbalized understanding.

## 2016-11-23 ENCOUNTER — Ambulatory Visit (INDEPENDENT_AMBULATORY_CARE_PROVIDER_SITE_OTHER): Payer: Medicare Other | Admitting: *Deleted

## 2016-11-23 DIAGNOSIS — I255 Ischemic cardiomyopathy: Secondary | ICD-10-CM | POA: Diagnosis not present

## 2016-11-23 DIAGNOSIS — I4901 Ventricular fibrillation: Secondary | ICD-10-CM | POA: Diagnosis not present

## 2016-11-23 DIAGNOSIS — I5022 Chronic systolic (congestive) heart failure: Secondary | ICD-10-CM

## 2016-11-23 LAB — CUP PACEART INCLINIC DEVICE CHECK
Battery Remaining Longevity: 137 mo
Battery Voltage: 3.13 V
Date Time Interrogation Session: 20180423095549
HIGH POWER IMPEDANCE MEASURED VALUE: 61 Ohm
HighPow Impedance: 48 Ohm
Implantable Lead Implant Date: 20091204
Implantable Lead Location: 753860
Implantable Pulse Generator Implant Date: 20180413
Lead Channel Impedance Value: 418 Ohm
Lead Channel Sensing Intrinsic Amplitude: 10.875 mV
Lead Channel Sensing Intrinsic Amplitude: 11.375 mV
MDC IDC MSMT LEADCHNL RV IMPEDANCE VALUE: 399 Ohm
MDC IDC MSMT LEADCHNL RV PACING THRESHOLD AMPLITUDE: 0.75 V
MDC IDC MSMT LEADCHNL RV PACING THRESHOLD PULSEWIDTH: 0.4 ms
MDC IDC SET LEADCHNL RV PACING AMPLITUDE: 2.5 V
MDC IDC SET LEADCHNL RV PACING PULSEWIDTH: 0.4 ms
MDC IDC SET LEADCHNL RV SENSING SENSITIVITY: 0.6 mV
MDC IDC STAT BRADY RV PERCENT PACED: 0 %

## 2016-11-23 NOTE — Progress Notes (Signed)
Wound check appointment s/p ICD gen change 11/13/16. Steri-strips previously removed by patient. Wound without redness or edema. Incision edges approximated, wound well healed. Normal device function. Thresholds, sensing, and impedances consistent with implant measurements. Histogram distribution appropriate for patient and level of activity. No ventricular arrhythmias noted. Patient educated about wound care, shock plan and carelink monitoring. ROV 02/15/17 with GT.

## 2016-11-23 NOTE — Telephone Encounter (Signed)
This encounter was created in error - please disregard.

## 2016-12-03 NOTE — H&P (Signed)
ICD Criteria  Current LVEF:25%. Within 12 months prior to implant: Yes   Heart failure history: Yes, Class II  Cardiomyopathy history: Yes, Non-Ischemic Cardiomyopathy.  Atrial Fibrillation/Atrial Flutter: No.  Ventricular tachycardia history: No.  Cardiac arrest history: Yes, Ventricular Fibrillation.  History of syndromes with risk of sudden death: No.  Previous ICD: Yes, Reason for ICD:  Secondary prevention.  Current ICD indication: Secondary  PPM indication: No.   Class I or II Bradycardia indication present: No  Beta Blocker therapy for 3 or more months: Yes, prescribed.   Ace Inhibitor/ARB therapy for 3 or more months: Yes, prescribed.

## 2016-12-04 ENCOUNTER — Telehealth: Payer: Self-pay

## 2016-12-04 NOTE — Telephone Encounter (Signed)
ICM clinic referral and wife requested enrollment.  Call to wife (DPR on file) and provided ICM intro.  She agreed to monthly ICM follow up.  Patient has monitor by beside and explained transmission should send automatically.  1st ICM remote transmission scheduled for 12/14/2016.

## 2016-12-14 ENCOUNTER — Telehealth: Payer: Self-pay | Admitting: Cardiology

## 2016-12-14 ENCOUNTER — Ambulatory Visit (INDEPENDENT_AMBULATORY_CARE_PROVIDER_SITE_OTHER): Payer: Self-pay

## 2016-12-14 DIAGNOSIS — Z95810 Presence of automatic (implantable) cardiac defibrillator: Secondary | ICD-10-CM

## 2016-12-14 DIAGNOSIS — I5022 Chronic systolic (congestive) heart failure: Secondary | ICD-10-CM

## 2016-12-14 NOTE — Telephone Encounter (Signed)
Spoke with pt and reminded pt of remote transmission that is due today. Pt verbalized understanding.   

## 2016-12-14 NOTE — Progress Notes (Signed)
EPIC Encounter for ICM Monitoring  Patient Name: Boneta LucksHarvey R Dietze is a 71 y.o. male Date: 12/14/2016 Primary Care Physican: Patient, No Pcp Per Primary Cardiologist: Excell Seltzerooper Electrophysiologist: Ladona Ridgelaylor Dry Weight: 205 lbs         1st ICM encounter and spoke with wife. Heart Failure questions reviewed, pt symptomatic with chronic ankle swelling and cough but has not got any worse than usual.   Thoracic impedance baseline still developing.   Prescribed dosage: Furosemide 40 mg 1 tablet daily  Recommendations: No changes. Discussed to limit salt intake to 2000 mg/day and fluid intake to < 2 liters/day.  Encouraged to call for fluid symptoms or use local ER for any urgent symptoms.  Follow-up plan: ICM clinic phone appointment on 01/14/2017.  Office appointment scheduled on 02/15/2017 with Dr Ladona Ridgelaylor and 03/01/2017 with Dr Excell Seltzerooper.  Copy of ICM check sent to device physician.   3 month ICM trend: 12/14/2016   1 Year ICM trend:      Karie SodaLaurie S Virgene Tirone, RN 12/14/2016 1:58 PM

## 2017-01-14 ENCOUNTER — Telehealth: Payer: Self-pay

## 2017-01-14 ENCOUNTER — Ambulatory Visit (INDEPENDENT_AMBULATORY_CARE_PROVIDER_SITE_OTHER): Payer: Medicare Other

## 2017-01-14 DIAGNOSIS — I5022 Chronic systolic (congestive) heart failure: Secondary | ICD-10-CM

## 2017-01-14 DIAGNOSIS — Z95810 Presence of automatic (implantable) cardiac defibrillator: Secondary | ICD-10-CM | POA: Diagnosis not present

## 2017-01-14 NOTE — Progress Notes (Addendum)
EPIC Encounter for ICM Monitoring  Patient Name: [name] is a 71 y.o. male Date: 01/14/2017 Primary Care Physican: Patient, No Pcp Per Primary Cardiologist: Excell Seltzerooper Electrophysiologist: Ladona Ridgelaylor Dry Weight:   previous weight 205 lbs       Attempted call to wife. Left message for return call.  Transmission reviewed.   Thoracic impedance abnormal from approximately 01/04/2017 until today, 01/14/2017, which is back to baseline.   Prescribed dosage: Furosemide 40 mg 1 tablet daily  Recommendations: NONE - Unable to reach patient   Follow-up plan: ICM clinic phone appointment on 01/28/2017 since patient has office appointment scheduled on 02/15/2017 with Dr Ladona Ridgelaylor and 03/01/2017 with Dr Excell Seltzerooper.  Copy of ICM check sent to device physician.   3 month ICM trend: 01/14/2017   1 Year ICM trend:      Karie SodaLaurie S Short, RN 01/14/2017 1:20 PM

## 2017-01-14 NOTE — Telephone Encounter (Signed)
Remote ICM transmission received.  Attempted patient call and left message to return call.   

## 2017-01-14 NOTE — Progress Notes (Signed)
Received call back from wife.  She stated patient has chronic ankle swelling but it has not been any worse than usual.  She asked questions regarding worsening of HF.  Advised that a healthy lifestyle such as following low salt diet, limiting fluid intake and exercise, if it has been approved by physician, may help from disease progressing.  She asked if patient should have alcohol and explained physicians normally advised against it.  She stated patient drinks a beer and he is very sedentary lifestyle.  Advised would recheck fluid levels on 01/28/2017.  Explained will send copy of ICM note to Dr Excell Seltzerooper and Dr Ladona Ridgelaylor and if any recommendations will call back.

## 2017-01-28 ENCOUNTER — Telehealth: Payer: Self-pay

## 2017-01-28 ENCOUNTER — Ambulatory Visit (INDEPENDENT_AMBULATORY_CARE_PROVIDER_SITE_OTHER): Payer: Self-pay

## 2017-01-28 DIAGNOSIS — Z95810 Presence of automatic (implantable) cardiac defibrillator: Secondary | ICD-10-CM

## 2017-01-28 DIAGNOSIS — I5022 Chronic systolic (congestive) heart failure: Secondary | ICD-10-CM

## 2017-01-28 NOTE — Telephone Encounter (Signed)
Remote ICM transmission received.  Attempted call to wife and left no answer.

## 2017-01-28 NOTE — Progress Notes (Signed)
EPIC Encounter for ICM Monitoring  Patient Name: [name] is a 72 y.o. male Date: 01/28/2017 Primary Care Physican: Patient, No Pcp Per Primary Cardiologist: Burt Knack Electrophysiologist: Druscilla Brownie Weight:Last known weight205lbs           Attempted call to wife and unable to reach.   Transmission reviewed.    Thoracic impedance abnormal suggesting fluid accumulation and starting to trend back toward baseline today.   Prescribed dosage: Furosemide 40 mg 1 tablet daily  Labs: 11/06/2016 Creatinine 1.28, BUN 12, Potassium 4.7, Sodium 141, EGFR 57-66  08/26/2016 Creatinine 1.48, BUN 18, Potassium 5.0, Sodium 141, EGFR 48-55   Recommendations: NONE - Unable to reach patient   Follow-up plan: ICM clinic phone appointment on 02/04/2017 to recheck fluid levels.  Office appointment scheduled 02/15/2017 with Dr. Lovena Le.  Copy of ICM check sent to primary cardiologist and device physician.   3 month ICM trend: 01/28/2017   1 Year ICM trend:      Rosalene Billings, RN 01/28/2017 12:16 PM

## 2017-01-29 NOTE — Progress Notes (Signed)
Wife returned call.  She reported patient is feeling fine and occasionally has ankle swelling.  No changes in weight and fluctuates within about 2 lbs of 205 lbs.  Asked if he could be eating more than 2000 mg salt a day and she said that could be causing the fluid retention.  Confirmed he is taking Furosemide 40 mg 1 tablet daily.   No changes today but would send copy to physicians and if any recommendations will call her back.  Recheck fluid levels on 02/04/2017.

## 2017-02-04 ENCOUNTER — Ambulatory Visit: Payer: Medicare Other | Admitting: Neurology

## 2017-02-04 ENCOUNTER — Ambulatory Visit (INDEPENDENT_AMBULATORY_CARE_PROVIDER_SITE_OTHER): Payer: Medicare Other

## 2017-02-04 DIAGNOSIS — Z95810 Presence of automatic (implantable) cardiac defibrillator: Secondary | ICD-10-CM

## 2017-02-04 DIAGNOSIS — I5022 Chronic systolic (congestive) heart failure: Secondary | ICD-10-CM

## 2017-02-04 NOTE — Progress Notes (Signed)
EPIC Encounter for ICM Monitoring  Patient Name: [name] is a 71 y.o. male Date: 02/04/2017 Primary Care Physican: Patient, No Pcp Per Primary Cardiologist: Burt Knack Electrophysiologist: Druscilla Brownie Weight:205lbs                                            Call to wife.  Heart Failure questions reviewed, pt asymptomatic.   Thoracic impedance returned to normal  Prescribed dosage: Furosemide 40 mg 1 tablet daily  Labs: 11/06/2016 Creatinine 1.28, BUN 12, Potassium 4.7, Sodium 141, EGFR 57-66  08/26/2016 Creatinine 1.48, BUN 18, Potassium 5.0, Sodium 141, EGFR 48-55   Recommendations: No changes.  Encouraged to call for fluid symptoms.  Follow-up plan: ICM clinic phone appointment on 03/18/2017.  Office appointment scheduled 02/15/2017 with Dr. Lovena Le and Dr Burt Knack on 03/01/2017.  Copy of ICM check sent to device physician.   3 month ICM trend: 02/04/2017   1 Year ICM trend:      Rosalene Billings, RN 02/04/2017 11:13 AM

## 2017-02-11 ENCOUNTER — Other Ambulatory Visit: Payer: Self-pay | Admitting: Neurology

## 2017-02-15 ENCOUNTER — Telehealth: Payer: Self-pay | Admitting: Neurology

## 2017-02-15 ENCOUNTER — Encounter: Payer: Self-pay | Admitting: Internal Medicine

## 2017-02-15 ENCOUNTER — Ambulatory Visit (INDEPENDENT_AMBULATORY_CARE_PROVIDER_SITE_OTHER): Payer: Medicare Other | Admitting: Internal Medicine

## 2017-02-15 VITALS — BP 132/74 | HR 71 | Ht 71.0 in | Wt 213.4 lb

## 2017-02-15 DIAGNOSIS — I5022 Chronic systolic (congestive) heart failure: Secondary | ICD-10-CM | POA: Diagnosis not present

## 2017-02-15 DIAGNOSIS — Z95810 Presence of automatic (implantable) cardiac defibrillator: Secondary | ICD-10-CM

## 2017-02-15 LAB — CUP PACEART INCLINIC DEVICE CHECK
Battery Remaining Longevity: 135 mo
HighPow Impedance: 64 Ohm
Implantable Lead Model: 6947
Lead Channel Impedance Value: 456 Ohm
Lead Channel Setting Pacing Pulse Width: 0.4 ms
Lead Channel Setting Sensing Sensitivity: 0.6 mV
MDC IDC LEAD IMPLANT DT: 20091204
MDC IDC LEAD LOCATION: 753860
MDC IDC PG IMPLANT DT: 20180413
MDC IDC SESS DTM: 20180716102454
MDC IDC SET LEADCHNL RV PACING AMPLITUDE: 2.5 V
MDC IDC STAT BRADY RV PERCENT PACED: 0.1 % — AB

## 2017-02-15 NOTE — Progress Notes (Signed)
HPI [name] returns today for followup. He is a very pleasant 71 year old man with an ischemic cardiomyopathy, chronic systolic heart failure, status post ventricular fibrillation arrest, status post ICD implantation. He also is a history of hypertension. The patient has been stable over the last several months. He denies syncope, chest pain, peripheral edema, or ICD shock. 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 He has not had any trouble healing since his device generator change. He admits to being sedentary. No Known Allergies   Current Outpatient Prescriptions  Medication Sig Dispense Refill  . acetaminophen (TYLENOL) 500 MG tablet Take 500-1,000 mg by mouth every 6 (six) hours as needed for moderate pain or headache.    Marland Kitchen aspirin (ASPIR-81) 81 MG EC tablet Take 81 mg by mouth daily.      . carvedilol (COREG) 25 MG tablet Take 0.5 tablets (12.5 mg total) by mouth 2 (two) times daily. 60 tablet 11  . digoxin (LANOXIN) 0.125 MG tablet TAKE 1 TABLET BY MOUTH DAILY. 30 tablet 11  . furosemide (LASIX) 40 MG tablet TAKE 1 TABLET (40 MG TOTAL) BY MOUTH DAILY. 30 tablet 11  . gabapentin (NEURONTIN) 400 MG capsule TAKE 2 CAPSULES BY MOUTH IN THE MORNING, 1 CAP AT MIDDAY, AND 2 CAPS IN THE EVENING 450 capsule 1  . hydrocortisone cream 1 % Apply 1 application topically daily as needed for itching.    . nitroGLYCERIN (NITROSTAT) 0.4 MG SL tablet Place 0.4 mg under the tongue every 5 (five) minutes as needed for chest pain (MAX 3 TABLETS).     . pantoprazole (PROTONIX) 40 MG tablet Take 1 tablet (40 mg total) by mouth daily. 30 tablet 11  . simvastatin (ZOCOR) 40 MG tablet TAKE 1 TABLET (40 MG TOTAL) BY MOUTH AT BEDTIME. 30 tablet 10  . vitamin B-12 (CYANOCOBALAMIN) 1000 MCG tablet Take 1,000 mcg by mouth daily.     No current facility-administered medications for this visit.      Past Medical History:  Diagnosis Date  . CAD (coronary artery disease)    status post anterior MI  2008 w V. fib arrest // LHC 8/17: LM 30, pLAD stent ok, LCx ok, pRCA 10  . Carotid artery disease (HCC)    Carotid US 12/13: Bilateral ICA 0-39%  . Dyslipidemia   . History of acute anterior wall MI 2009   s/p stenting to LAD  . Hypertension   . Ischemic cardiomyopathy    a. Echo 9/17: Mild LVH, EF 25-30, ant-septal, inf-septal, apical AK, apical inf AK, severe ant HK, Gr 1 DD, mildly dilated aortic root and ascending aorta (asc Ao 38 mm, Ao root 39 mm), mild reduced RVSF  . Macular degeneration   . Other vitamin B12 deficiency anemia 08/14/2013  . Polyneuropathy in other diseases classified elsewhere (HCC) 02/12/2014  . Tremor, essential 02/03/2016  . Ventricular fibrillation (HCC)    s/p ICD    ROS:   All systems reviewed and negative except as noted in the HPI.   Past Surgical History:  Procedure Laterality Date  . CARDIAC CATHETERIZATION  09/12/2007  . CARDIAC CATHETERIZATION N/A 03/26/2016   Procedure: Right/Left Heart Cath and Coronary Angiography;  Surgeon: Yvonne Kendall, MD;  Location: [facility] INVASIVE CV LAB;  Service: Cardiovascular;  Laterality: N/A;  . CATARACT EXTRACTION Bilateral   . ICD GENERATOR CHANGEOUT N/A 11/13/2016   Procedure: ICD Generator Changeout;  Surgeon: Marinus Maw, MD;  Location: [facility] INVASIVE CV LAB;  Service: Cardiovascular;  Laterality:  N/A;  . ICD medtronic implanted  07/06/08   Dr. Sharrell KuGreg Aliani Caccavale     Family History  Problem Relation Age of Onset  . Heart attack Father   . Cancer - Colon Sister   . Liver disease Brother   . Neuropathy Sister   . Neuropathy Sister      Social History   Social History  . Marital status: Married    Spouse name: [name]  . Number of children: 4  . Years of education: HS   Occupational History  . Retired [employer]   Social History Main Topics  . Smoking status: Former Games developermoker  . Smokeless tobacco: Never Used     Comment: QUIT 09/2007  . Alcohol use 4.2 oz/week    7 Standard drinks or equivalent per  week     Comment: beer  . Drug use: No  . Sexual activity: Not on file   Other Topics Concern  . Not on file   Social History Narrative   Patient lives at home with his wife [name]([name]).   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   Education high school.   Left handed.   Caffeine one cup of coffee daily.      BP 132/74   Pulse 71   Ht 5\' 11"  (1.803 m)   Wt 213 lb 6.4 oz (96.8 kg)   SpO2 93%   BMI 29.76 kg/m  130/82 by me Physical Exam:  Well appearing 71 year old man,NAD HEENT: Unremarkable Neck:  6 cm JVD, no thyromegally Lungs:  Clear with no wheezes, rales, or rhonchi. HEART:  Regular rate rhythm, no murmurs, no rubs, no clicks Abd:  soft, positive bowel sounds, no organomegally, no rebound, no guarding Ext:  2 plus pulses, no edema, no cyanosis, no clubbing Skin:  No rashes no nodules Neuro:  CN II through XII intact, motor grossly intact  DEVICE  Normal device function.  See PaceArt for details.   Assess/Plan: 1. VF arrest - he has had no recurrent ventricular arrhythmias. No change in meds. 2. Chronic systolic heart failure - his symptoms are class 2A. He will continue his current meds. He is encouraged to maintain a low sodium diet.  3. ICD - his medtronic device is working normally. Will recheck in several months.  Leonia ReevesGregg Tracy Kinner,M.D.

## 2017-02-15 NOTE — Patient Instructions (Addendum)
Medication Instructions:  Your physician recommends that you continue on your current medications as directed. Please refer to the Current Medication list given to you today.   Labwork: None Ordered   Testing/Procedures: None Ordered   Follow-Up: Your physician wants you to follow-up in: 1 year with Dr. Ladona Ridgelaylor. You will receive a reminder letter in the mail two months in advance. If you don't receive a letter, please call our office to schedule the follow-up appointment.  Remote monitoring is used to monitor your ICD from home. This monitoring reduces the number of office visits required to check your device to one time per year. It allows us to keep an eye on the functioning of your device to ensure it is working properly. You are scheduled for a device check from home on 03/18/17 . You may send your transmission at any time that day. If you have a wireless device, the transmission will be sent automatically. After your physician reviews your transmission, you will receive a postcard with your next transmission date.    Any Other Special Instructions Will Be Listed Below (If Applicable).     If you need a refill on your cardiac medications before your next appointment, please call your pharmacy.

## 2017-02-15 NOTE — Telephone Encounter (Signed)
Error ta

## 2017-02-16 ENCOUNTER — Telehealth: Payer: Self-pay | Admitting: Neurology

## 2017-02-16 MED ORDER — GABAPENTIN 400 MG PO CAPS: ORAL_CAPSULE | ORAL | 5 refills | Status: DC

## 2017-02-16 NOTE — Addendum Note (Signed)
Addended by: York SpanielWILLIS, Jakaiden Fill K on: 02/16/2017 04:23 PM   Modules accepted: Orders

## 2017-02-16 NOTE — Telephone Encounter (Signed)
I called patient. The wife indicated that they do not have enough medication to last, the pharmacy may have forgotten to give them 1 bottle of 100 capsules.  I will send in a prescription for a one-month supply with 5 refills of the gabapentin.

## 2017-02-16 NOTE — Telephone Encounter (Signed)
Called and spoke with patients wife. She stated she last got rx filled in May per pharmacy for 90 day supply. Qty 100 on bottle.  She states they only have a little over 40 capsules left and being told she cannot get refill until next month. She is concerned because patient will run out of med before they can get next refill. Advised I will call pharmacy to find out more information and call back to advise.

## 2017-02-16 NOTE — Telephone Encounter (Signed)
Pt wife calling re: the gabapentin (NEURONTIN) 400 MG capsule, pt wife said he was getting a 90 day supply and on last refill it was only enough for a week, please call

## 2017-02-16 NOTE — Telephone Encounter (Signed)
[facility] pt got rx gabapentin 400mg  capsule filled on 12/14/16 qty 450 for 90 day supply. They sent 4 bottles with qty 100 and [facility]  Advised I will have to send to CW,MD for review.

## 2017-03-01 ENCOUNTER — Ambulatory Visit: Payer: Medicare Other | Admitting: Cardiovascular Disease

## 2017-03-04 ENCOUNTER — Encounter: Payer: Self-pay | Admitting: Cardiovascular Disease

## 2017-03-04 ENCOUNTER — Encounter (INDEPENDENT_AMBULATORY_CARE_PROVIDER_SITE_OTHER): Payer: Self-pay

## 2017-03-04 ENCOUNTER — Ambulatory Visit (INDEPENDENT_AMBULATORY_CARE_PROVIDER_SITE_OTHER): Payer: Medicare Other | Admitting: Cardiovascular Disease

## 2017-03-04 VITALS — BP 144/60 | HR 80 | Ht 71.0 in | Wt 209.8 lb

## 2017-03-04 DIAGNOSIS — I255 Ischemic cardiomyopathy: Secondary | ICD-10-CM

## 2017-03-04 DIAGNOSIS — I2510 Atherosclerotic heart disease of native coronary artery without angina pectoris: Secondary | ICD-10-CM

## 2017-03-04 DIAGNOSIS — E7800 Pure hypercholesterolemia, unspecified: Secondary | ICD-10-CM

## 2017-03-04 DIAGNOSIS — I5022 Chronic systolic (congestive) heart failure: Secondary | ICD-10-CM | POA: Diagnosis not present

## 2017-03-04 MED ORDER — NITROGLYCERIN 0.4 MG SL SUBL: 0.4000 mg | SUBLINGUAL_TABLET | SUBLINGUAL | 3 refills | Status: AC | PRN

## 2017-03-04 NOTE — Patient Instructions (Signed)
Medication Instructions:  Your physician recommends that you continue on your current medications as directed. Please refer to the Current Medication list given to you today.  Labwork: Your physician recommends that you return for a FASTING LIPID and CMP in 6 MONTHS--nothing to eat or drink after midnight, lab opens at 7:30 AM  Testing/Procedures: No new orders.   Follow-Up: Your physician wants you to follow-up in: 6 MONTHS with Dr Excell Seltzerooper.  You will receive a reminder letter in the mail two months in advance. If you don't receive a letter, please call our office to schedule the follow-up appointment.   Any Other Special Instructions Will Be Listed Below (If Applicable).     If you need a refill on your cardiac medications before your next appointment, please call your pharmacy.

## 2017-03-04 NOTE — Progress Notes (Signed)
Cardiology Office Note Date:  03/06/2017   ID:  [name], DOB 28-Aug-1945, MRN [id number]  PCP:  Patient, No Pcp Per  Cardiologist:  Sherren Mocha, MD    Chief Complaint  Patient presents with  . Follow-up     History of Present Illness: [name] is a 71 y.o. male who presents for follow-up of CAD and heart failure.   He initially presented in 2009 with an anterior MI complicated by cardiac arrest. He underwent primary PCI of the proximal LAD but experienced poor recovery of LV function with a severe cardiomyopathy. He has undergone ICD implantation for primary prevention of sudden cardiac death. He returns today with his wife for follow-up evaluation.  The patient complains of mild shortness of breath with activity. He is most limited by problems with neuropathy. He has pain and weakness in his legs. He denies orthopnea, PND, chest pain, or chest pressure. He denies palpitations, lightheadedness, or syncope.   Past Medical History:  Diagnosis Date  . CAD (coronary artery disease)    status post anterior MI 2008 w V. fib arrest // LHC 8/17: LM 30, pLAD stent ok, LCx ok, pRCA 10  . Carotid artery disease ([facility])    Carotid US 12/13: Bilateral ICA 0-39%  . Dyslipidemia   . History of acute anterior wall MI 2009   s/p stenting to LAD  . Hypertension   . Ischemic cardiomyopathy    a. Echo 9/17: Mild LVH, EF 25-30, ant-septal, inf-septal, apical AK, apical inf AK, severe ant HK, Gr 1 DD, mildly dilated aortic root and ascending aorta (asc Ao 38 mm, Ao root 39 mm), mild reduced RVSF  . Macular degeneration   . Other vitamin B12 deficiency anemia 08/14/2013  . Polyneuropathy in other diseases classified elsewhere ([place]) 02/12/2014  . Tremor, essential 02/03/2016  . 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 CARDIAC CATHETERIZATION  09/12/2007  . CARDIAC CATHETERIZATION N/A 03/26/2016   Procedure: Right/Left Heart  Cath and Coronary Angiography;  Surgeon: Nelva Bush, MD;  Location: [facility];  Service: Cardiovascular;  Laterality: N/A;  . CATARACT EXTRACTION Bilateral   . ICD GENERATOR CHANGEOUT N/A 11/13/2016   Procedure: ICD Generator Changeout;  Surgeon: Evans Lance, MD;  Location: [facility];  Service: Cardiovascular;  Laterality: N/A;  . ICD medtronic implanted  07/06/08   Dr. Crissie Sickles    Current Outpatient Prescriptions  Medication Sig Dispense Refill  . acetaminophen (TYLENOL) 500 MG tablet Take 500-1,000 mg by mouth every 6 (six) hours as needed for moderate pain or headache.    Marland Kitchen aspirin (ASPIR-81) 81 MG EC tablet Take 81 mg by mouth daily.      . carvedilol (COREG) 25 MG tablet Take 0.5 tablets (12.5 mg total) by mouth 2 (two) times daily. 60 tablet 11  . digoxin (LANOXIN) 0.125 MG tablet TAKE 1 TABLET BY MOUTH DAILY. 30 tablet 11  . furosemide (LASIX) 40 MG tablet TAKE 1 TABLET (40 MG TOTAL) BY MOUTH DAILY. 30 tablet 11  . gabapentin (NEURONTIN) 400 MG capsule TAKE 2 CAPSULES BY MOUTH IN THE MORNING, 1 CAP AT MIDDAY, AND 2 CAPS IN THE EVENING 150 capsule 5  . hydrocortisone cream 1 % Apply 1 application topically daily as needed for itching.    . nitroGLYCERIN (NITROSTAT) 0.4 MG SL tablet Place 1 tablet (0.4 mg total) under the tongue every 5 (five) minutes as  needed for chest pain (MAX 3 TABLETS). 25 tablet 3  . pantoprazole (PROTONIX) 40 MG tablet Take 1 tablet (40 mg total) by mouth daily. 30 tablet 11  . simvastatin (ZOCOR) 40 MG tablet TAKE 1 TABLET (40 MG TOTAL) BY MOUTH AT BEDTIME. 30 tablet 10  . vitamin B-12 (CYANOCOBALAMIN) 1000 MCG tablet Take 1,000 mcg by mouth daily.     No current facility-administered medications for this visit.     Allergies:   Patient has no known allergies.   Social History:  The patient  reports that he has quit smoking. He has never used smokeless tobacco. He reports that he drinks about 4.2 oz of alcohol per week . He reports  that he does not use drugs.   Family History:  The patient's  family history includes Cancer - Colon in his sister; Heart attack in his father; Liver disease in his brother; Neuropathy in his sister and sister.    ROS:  Please see the history of present illness.  Otherwise, review of systems is positive for Exertional dyspnea, numbness and tingling in his legs.  All other systems are reviewed and negative.    PHYSICAL EXAM: VS:  BP (!) 144/60   Pulse 80   Ht 5' 11"  (1.803 m)   Wt 209 lb 12.8 oz (95.2 kg)   SpO2 94%   BMI 29.26 kg/m  , BMI Body mass index is 29.26 kg/m. GEN: Well nourished, well developed, in no acute distress  HEENT: normal  Neck: no JVD, no masses. No carotid bruits Cardiac: RRR without murmur or gallop                Respiratory:  clear to auscultation bilaterally, normal work of breathing GI: soft, nontender, nondistended, + BS MS: no deformity or atrophy  Ext: no pretibial edema, pedal pulses 2+= bilaterally Skin: warm and dry, no rash Neuro:  Strength and sensation are intact Psych: euthymic mood, full affect  EKG:  EKG is not ordered today.  Recent Labs: 08/26/2016: ALT 26 11/06/2016: BUN 13; Creatinine, Ser 1.28; Hemoglobin 14.7; Platelets 189; Potassium 4.7; Sodium 141   Lipid Panel     Component Value Date/Time   CHOL 146 08/26/2016 0922   TRIG 208 (H) 08/26/2016 0922   HDL 34 (L) 08/26/2016 0922   CHOLHDL 4.3 08/26/2016 0922   CHOLHDL 3.2 06/24/2015 0748   VLDL 31 (H) 06/24/2015 0748   LDLCALC 70 08/26/2016 0922      Wt Readings from Last 3 Encounters:  03/04/17 209 lb 12.8 oz (95.2 kg)  02/15/17 213 lb 6.4 oz (96.8 kg)  11/13/16 205 lb (93 kg)     Cardiac Studies Reviewed: 2-D echocardiogram 04/09/2016: Study Conclusions  - Left ventricle: The cavity size was normal. Wall thickness was   increased in a pattern of mild LVH. Systolic function was   severely reduced. The estimated ejection fraction was in the   range of 25% to 30%.  Anteroseptal, inferoseptal, apical akinesis.   Akinesis of the apical inferior wall. Severe anterior   hypokinesis. Doppler parameters are consistent with abnormal left   ventricular relaxation (grade 1 diastolic dysfunction). - Aortic valve: There was no stenosis. There was trivial   regurgitation. - Aorta: Mildly dilated aortic root and ascending aorta. Ascending   aorta dimension: 38 mm. Aortic root dimension: 39 mm (ED). - Mitral valve: There was no significant regurgitation. - Right ventricle: The cavity size was normal. Pacer wire or   catheter noted in right ventricle.  Systolic function was mildly   reduced. - Pulmonary arteries: No complete TR doppler jet so unable to   estimate PA systolic pressure. - Inferior vena cava: The vessel was normal in size. The   respirophasic diameter changes were in the normal range (= 50%),   consistent with normal central venous pressure.  Impressions:  - Normal LV size with mild LV hypertrophy. EF 25-30%, wall motion   abnormalities as noted above. Normal RV size with mildly   decreased systolic function. No significant valvular   abnormalities.  Cardiac catheterization 03/26/2016: Conclusion    Widely patent stent in ostial/proximal LAD.  Mild to moderate, non-obstructive coronary artery disease involving proximal RCA and distal LMCA.  Mildly elevated left ventricular filling pressure, right ventricular filling pressure, and pulmonary artery pressure.  Normal Fick Cardiac output.  Plan:  Continue aggressive secondary prevention and medical therapy, including escalation of afterload reduction given systemic hypertension and elevated filling pressures.  Outpatient follow-up with Dr. Burt Knack as previously planned.   ASSESSMENT AND PLAN: 1.  Chronic systolic heart failure: Patient with NYHA functional class IIB symptoms. Lifestyle modification is discussed at length with the patient including need for increasing activity, specific  dietary recommendations, and following a low-sodium diet. The patient will be continued on a combination of carvedilol, digoxin, furosemide, and simvastatin. He was unable to tolerate an ACE/ARB even at low dose because of symptomatic hypotension.  2. Coronary artery disease, native vessel: The patient has no symptoms of angina. His most recent heart catheterization study results are reviewed and outlined above. He continues on aspirin and a beta blocker.  3. Hyperlipidemia: We again discussed lifestyle modification at length. He continues on simvastatin. Last labs are reviewed with an LDL cholesterol of 70 mg/dL.  4. Ischemic cardiomyopathy status post ICD: Patient recently seen by Dr. Lovena Le. No interval device discharges.  Current medicines are reviewed with the patient today.  The patient does not have concerns regarding medicines.  Labs/ tests ordered today include:   Orders Placed This Encounter  Procedures  . Lipid panel  . Comp Met (CMET)    Disposition:   FU 6 months  Signed, Sherren Mocha, MD  03/06/2017 12:32 PM    [facility] [place], [place], [place]  [address] Phone: [phone]; Fax: [phone]

## 2017-03-05 ENCOUNTER — Other Ambulatory Visit: Payer: Self-pay | Admitting: Cardiovascular Disease

## 2017-03-05 DIAGNOSIS — E7800 Pure hypercholesterolemia, unspecified: Secondary | ICD-10-CM

## 2017-03-08 ENCOUNTER — Telehealth: Payer: Self-pay | Admitting: Cardiovascular Disease

## 2017-03-08 MED ORDER — LOSARTAN POTASSIUM 25 MG PO TABS: 25.0000 mg | ORAL_TABLET | Freq: Every day | ORAL | 8 refills | Status: DC

## 2017-03-08 NOTE — Telephone Encounter (Signed)
New Message    Pt has been taken this medication daily he is out of the medicine it is not on his medication list   *STAT* If patient is at the pharmacy, call can be transferred to refill team.   1. Which medications need to be refilled? (please list name of each medication and dose if known)  Losartan 25mg   2. Which pharmacy/location (including street and city if local pharmacy) is medication to be sent to? [facility]   3. Do they need a 30 day or 90 day supply?  30

## 2017-03-08 NOTE — Telephone Encounter (Signed)
Reviewed with Dr Excell Seltzerooper and if the pt is tolerating losartan then he should continue medication.  Previously the pt had issues with hypotension. I spoke with the pt's wife and the pt has been taking this medication without any issues.  Rx sent to the pharmacy. I spoke with her about monitoring the pt's BP and notifying the office is BP becomes low.

## 2017-03-18 ENCOUNTER — Ambulatory Visit (INDEPENDENT_AMBULATORY_CARE_PROVIDER_SITE_OTHER): Payer: Medicare Other

## 2017-03-18 DIAGNOSIS — Z95810 Presence of automatic (implantable) cardiac defibrillator: Secondary | ICD-10-CM

## 2017-03-18 DIAGNOSIS — I5022 Chronic systolic (congestive) heart failure: Secondary | ICD-10-CM

## 2017-03-18 NOTE — Progress Notes (Signed)
EPIC Encounter for ICM Monitoring  Patient Name: [name] is a 71 y.o. male Date: 03/18/2017 Primary Care Physican: Patient, No Pcp Per Primary Cardiologist: Burt Knack Electrophysiologist: Druscilla Brownie Weight:206lbs   Call to wife.   Heart Failure questions reviewed, pt asymptomatic.   Thoracic impedance normal .  Prescribed dosage: Furosemide 40 mg 1 tablet daily  Labs: 11/06/2016 Creatinine 1.28, BUN 12, Potassium 4.7, Sodium 141, EGFR 57-66  08/26/2016 Creatinine 1.48, BUN 18, Potassium 5.0, Sodium 141, EGFR 48-55   Recommendations: No changes.  Wife stated he does not follow low salt diet.  Encouraged to call for fluid symptoms.  Follow-up plan: ICM clinic phone appointment on 04/19/2017.    Copy of ICM check sent to device physician.   3 month ICM trend: 03/18/2017   1 Year ICM trend:      Rosalene Billings, RN 03/18/2017 1:52 PM

## 2017-03-23 ENCOUNTER — Other Ambulatory Visit: Payer: Self-pay | Admitting: Cardiovascular Disease

## 2017-03-23 DIAGNOSIS — I25119 Atherosclerotic heart disease of native coronary artery with unspecified angina pectoris: Secondary | ICD-10-CM

## 2017-03-23 DIAGNOSIS — I10 Essential (primary) hypertension: Secondary | ICD-10-CM

## 2017-04-19 ENCOUNTER — Telehealth: Payer: Self-pay | Admitting: Cardiology

## 2017-04-19 ENCOUNTER — Ambulatory Visit (INDEPENDENT_AMBULATORY_CARE_PROVIDER_SITE_OTHER): Payer: Medicare Other

## 2017-04-19 DIAGNOSIS — Z95810 Presence of automatic (implantable) cardiac defibrillator: Secondary | ICD-10-CM | POA: Diagnosis not present

## 2017-04-19 DIAGNOSIS — I5022 Chronic systolic (congestive) heart failure: Secondary | ICD-10-CM | POA: Diagnosis not present

## 2017-04-19 NOTE — Telephone Encounter (Signed)
LMOVM reminding pt to send remote transmission.   

## 2017-04-20 NOTE — Progress Notes (Signed)
EPIC Encounter for ICM Monitoring  Patient Name: [name] is a 71 y.o. male Date: 04/20/2017 Primary Care Physican: Patient, No Pcp Per Primary Cardiologist: Burt Knack Electrophysiologist: Druscilla Brownie Weight:206lbs   Call to wife.  Heart Failure questions reviewed, pt asymptomatic.   Thoracic impedance normal.  Prescribed dosage: Furosemide 40 mg 1 tablet daily  Labs: 11/06/2016 Creatinine 1.28, BUN 12, Potassium 4.7, Sodium 141, EGFR 57-66  08/26/2016 Creatinine 1.48, BUN 18, Potassium 5.0, Sodium 141, EGFR 48-55  Recommendations: No changes.   Encouraged to call for fluid symptoms.  Follow-up plan: ICM clinic phone appointment on 05/20/2017.    Copy of ICM check sent to Dr. Lovena Le.   3 month ICM trend: 04/20/2017   1 Year ICM trend:      Rosalene Billings, RN 04/20/2017 11:24 AM

## 2017-05-19 ENCOUNTER — Ambulatory Visit: Payer: Medicare Other | Admitting: Neurology

## 2017-05-20 ENCOUNTER — Ambulatory Visit (INDEPENDENT_AMBULATORY_CARE_PROVIDER_SITE_OTHER): Payer: Medicare Other

## 2017-05-20 DIAGNOSIS — Z95810 Presence of automatic (implantable) cardiac defibrillator: Secondary | ICD-10-CM | POA: Diagnosis not present

## 2017-05-20 DIAGNOSIS — I5022 Chronic systolic (congestive) heart failure: Secondary | ICD-10-CM

## 2017-05-20 NOTE — Progress Notes (Signed)
EPIC Encounter for ICM Monitoring  Patient Name: [name] is a 71 y.o. male Date: 05/20/2017 Primary Care Physican: Patient, No Pcp Per Primary Cardiologist: Burt Knack Electrophysiologist: Druscilla Brownie Weight:210lbs   Call to wife.   She says patient has chronic chest congestion has some shortness of breath at times and weight fluctuates by 2-3 lbs  Patient used to be a smoker.  Wife thinks he may have some COPD but has not been diagnosed.    Thoracic impedance normal.  Impedance has pattern of fluid accumulation for about 2 days and then returns to baseline.  Prescribed dosage: Furosemide 40 mg 1 tablet daily.  Not taking as prescribed.  He is taking Furosemide every other day because he has difficulty walking to the bathroom due to neuropathy.  Encouraged to take as prescribed.    Labs: 11/06/2016 Creatinine 1.28, BUN 12, Potassium 4.7, Sodium 141, EGFR 57-66  08/26/2016 Creatinine 1.48, BUN 18, Potassium 5.0, Sodium 141, EGFR 48-55  Recommendations: No changes.   Encouraged to call for fluid symptoms.  Follow-up plan: ICM clinic phone appointment on 06/21/2017.   Copy of ICM check sent to Dr. Lovena Le.   3 month ICM trend: 05/20/2017   1 Year ICM trend:      Rosalene Billings, RN 05/20/2017 4:00 PM

## 2017-06-03 ENCOUNTER — Encounter: Payer: Self-pay | Admitting: Neurology

## 2017-06-03 ENCOUNTER — Ambulatory Visit (INDEPENDENT_AMBULATORY_CARE_PROVIDER_SITE_OTHER): Payer: Medicare Other | Admitting: Neurology

## 2017-06-03 VITALS — BP 122/63 | HR 80 | Ht 71.0 in | Wt 212.0 lb

## 2017-06-03 DIAGNOSIS — G63 Polyneuropathy in diseases classified elsewhere: Secondary | ICD-10-CM | POA: Diagnosis not present

## 2017-06-03 MED ORDER — DULOXETINE HCL 30 MG PO CPEP: 30.0000 mg | ORAL_CAPSULE | Freq: Every day | ORAL | 3 refills | Status: DC

## 2017-06-03 MED ORDER — GABAPENTIN 800 MG PO TABS: 800.0000 mg | ORAL_TABLET | Freq: Three times a day (TID) | ORAL | 3 refills | Status: DC

## 2017-06-03 NOTE — Progress Notes (Signed)
Reason for visit: Peripheral neuropathy  [name] is an [age] y.o. male  History of present illness:  [name] is a 71 year old left-handed white male with a history of a peripheral neuropathy and a mild gait disturbance.  The patient is on gabapentin taking 800 mg in the morning and evening and 400 mg at midday.  The patient has some difficulty in the evening hours with discomfort, and particularly in the morning that he may have hypersensitivity of the toes.  The patient does have some mild gait instability, he may use a cane while outside of the house.  He reports no falls.  He has difficulty when walking in dimly lit rooms.  He returns to the office today for an evaluation.  He is tolerating the gabapentin fairly well.  Many days he has to take an extra tablet to help control the pain.  Past Medical History:  Diagnosis Date  . CAD (coronary artery disease)    status post anterior MI 2008 w V. fib arrest // LHC 8/17: LM 30, pLAD stent ok, LCx ok, pRCA 10  . Carotid artery disease ([facility])    Carotid US 12/13: Bilateral ICA 0-39%  . Dyslipidemia   . History of acute anterior wall MI 2009   s/p stenting to LAD  . Hypertension   . Ischemic cardiomyopathy    a. Echo 9/17: Mild LVH, EF 25-30, ant-septal, inf-septal, apical AK, apical inf AK, severe ant HK, Gr 1 DD, mildly dilated aortic root and ascending aorta (asc Ao 38 mm, Ao root 39 mm), mild reduced RVSF  . Macular degeneration   . Other vitamin B12 deficiency anemia 08/14/2013  . Polyneuropathy in other diseases classified elsewhere ([facility]) 02/12/2014  . Tremor, essential 02/03/2016  . Ventricular fibrillation [facility])    s/p ICD    Past Surgical History:  Procedure Laterality Date  . CARDIAC CATHETERIZATION  09/12/2007  . CARDIAC CATHETERIZATION N/A 03/26/2016   Procedure: Right/Left Heart Cath and Coronary Angiography;  Surgeon: Yvonne Kendallhristopher End, MD;  [facility];  Service: Cardiovascular;  Laterality: N/A;  .  CATARACT EXTRACTION Bilateral   . ICD GENERATOR CHANGEOUT N/A 11/13/2016   Procedure: ICD Generator Changeout;  Surgeon: Marinus MawGregg W Taylor, MD;  [facility];  Service: Cardiovascular;  Laterality: N/A;  . ICD medtronic implanted  07/06/08   Dr. Sharrell KuGreg Taylor    Family History  Problem Relation Age of Onset  . Heart attack Father   . Cancer - Colon Sister   . Liver disease Brother   . Neuropathy Sister   . Neuropathy Sister     Social history:  reports that he has quit smoking. He has never used smokeless tobacco. He reports that he drinks about 4.2 oz of alcohol per week . He reports that he does not use drugs.   No Known Allergies  Medications:  Prior to Admission medications   Medication Sig Start Date End Date Taking? Authorizing Provider  acetaminophen (TYLENOL) 500 MG tablet Take 500-1,000 mg by mouth every 6 (six) hours as needed for moderate pain or headache.   Yes [provider]  aspirin (ASPIR-81) 81 MG EC tablet Take 81 mg by mouth daily.     Yes [provider]  carvedilol (COREG) 25 MG tablet Take 0.5 tablets (12.5 mg total) by mouth 2 (two) times daily. 04/29/16  Yes Tonny Bollmanooper, Michael, MD  digoxin (LANOXIN) 0.125 MG tablet TAKE 1 TABLET BY MOUTH DAILY. 07/20/16  Yes Excell Seltzerooper,  Casimiro Needle, MD  furosemide (LASIX) 40 MG tablet TAKE 1 TABLET (40 MG TOTAL) BY MOUTH DAILY. 09/22/16  Yes Tonny Bollman, MD  gabapentin (NEURONTIN) 400 MG capsule TAKE 2 CAPSULES BY MOUTH IN THE MORNING, 1 CAP AT MIDDAY, AND 2 CAPS IN THE EVENING 02/16/17  Yes York Spaniel, MD  losartan (COZAAR) 25 MG tablet Take 1 tablet (25 mg total) by mouth daily. 03/08/17 06/06/17 Yes Tonny Bollman, MD  nitroGLYCERIN (NITROSTAT) 0.4 MG SL tablet Place 1 tablet (0.4 mg total) under the tongue every 5 (five) minutes as needed for chest pain (MAX 3 TABLETS). 03/04/17  Yes Tonny Bollman, MD  pantoprazole (PROTONIX) 40 MG tablet TAKE 1 TABLET (40 MG TOTAL) BY MOUTH DAILY. 03/23/17  Yes Tonny Bollman, MD  simvastatin (ZOCOR) 40 MG tablet TAKE 1 TABLET (40 MG TOTAL) BY MOUTH AT BEDTIME. 07/14/16  Yes Tonny Bollman, MD  vitamin B-12 (CYANOCOBALAMIN) 1000 MCG tablet Take 1,000 mcg by mouth daily.   Yes [provider]    ROS:  Out of a complete 14 system review of symptoms, the patient complains only of the following symptoms, and all other reviewed systems are negative.  Gait disturbance  Blood pressure 122/63, pulse 80, height 5\' 11"  (1.803 m), weight 212 lb (96.2 kg).  Physical Exam  General: The patient is alert and cooperative at the time of the examination.  Skin: 1+ edema below the knees is seen bilaterally.   Neurologic Exam  Mental status: The patient is alert and oriented x 3 at the time of the examination. The patient has apparent normal recent and remote memory, with an apparently normal attention span and concentration ability.   Cranial nerves: Facial symmetry is present. Speech is normal, no aphasia or dysarthria is noted. Extraocular movements are full. Visual fields are full.  A head nod tremor is seen.  Motor: The patient has good strength in all 4 extremities.  Sensory examination: Soft touch sensation is symmetric on the face, arms, and legs.  Coordination: The patient has good finger-nose-finger and heel-to-shin bilaterally.  Gait and station: The patient has a normal gait. Tandem gait is unsteady. Romberg is negative. No drift is seen.  The patient is able to walk on heels and the toes bilaterally.  Reflexes: Deep tendon reflexes are symmetric, but are depressed.   Assessment/Plan:  1.  Peripheral neuropathy  2.  Mild gait disturbance  The patient appears to need more pain control at this point, he is having some issues at nighttime and first thing in the morning.  The patient will go up on the gabapentin taking 800 mg 3 times daily, Cymbalta in low-dose will be added at night taking 30 mg.  A prescription was called in for these  medications.  The patient will follow-up in 6 months, he will call for any dose adjustments.  Marlan Palau MD 06/03/2017 11:56 AM  [facility] [address] [place], [place] [address]  Phone [phone] Fax [phone]

## 2017-06-03 NOTE — Patient Instructions (Signed)
   We will go up to 800 mg three times a day on the gabapentin.  Add 30 mg at night of the Cymbalta.  Cymbalta (duloxetine) is an antidepressant medication that is commonly used for peripheral neuropathy pain or for fibromyalgia pain. As with any antidepressant medication, worsening depression can be seen. This medication can potentially cause headache, dizziness, sexual dysfunction, or nausea. If any problems are noted on this medication, please contact our office.   Neurontin (gabapentin) may result in drowsiness, ankle swelling, gait instability, or possibly dizziness. Please contact our office if significant side effects occur with this medication.

## 2017-06-05 ENCOUNTER — Other Ambulatory Visit: Payer: Self-pay | Admitting: Cardiovascular Disease

## 2017-06-05 DIAGNOSIS — I5022 Chronic systolic (congestive) heart failure: Secondary | ICD-10-CM

## 2017-06-05 DIAGNOSIS — I10 Essential (primary) hypertension: Secondary | ICD-10-CM

## 2017-06-05 DIAGNOSIS — I2510 Atherosclerotic heart disease of native coronary artery without angina pectoris: Secondary | ICD-10-CM

## 2017-06-21 ENCOUNTER — Ambulatory Visit (INDEPENDENT_AMBULATORY_CARE_PROVIDER_SITE_OTHER): Payer: Medicare Other

## 2017-06-21 DIAGNOSIS — Z95810 Presence of automatic (implantable) cardiac defibrillator: Secondary | ICD-10-CM | POA: Diagnosis not present

## 2017-06-21 DIAGNOSIS — I5022 Chronic systolic (congestive) heart failure: Secondary | ICD-10-CM | POA: Diagnosis not present

## 2017-06-21 NOTE — Progress Notes (Signed)
EPIC Encounter for ICM Monitoring  Patient Name: [name] is a 71 y.o. male Date: 06/21/2017 Primary Care Physican: Patient, No Pcp Per Primary Cardiologist: Burt Knack Electrophysiologist: Druscilla Brownie Weight:Previous weight 543ETU        Attempted call to wife and unable to reach.  Left detailed message regarding transmission.  Transmission reviewed.    Thoracic impedance abnormal suggesting fluid accumulation since 11/4 but almost at baseline today.  Prescribed dosage: Furosemide 40 mg 1 tablet daily.  Not taking as prescribed.  He is taking Furosemide every other day because he has difficulty walking to the bathroom due to neuropathy.  Encouraged to take as prescribed.    Labs: 11/06/2016 Creatinine 1.28, BUN 12, Potassium 4.7, Sodium 141, EGFR 57-66  08/26/2016 Creatinine 1.48, BUN 18, Potassium 5.0, Sodium 141, EGFR 48-55  Recommendations: Left voice mail with ICM number and encouraged to call if experiencing any fluid symptoms.  Follow-up plan: ICM clinic phone appointment on 07/22/2017.    Copy of ICM check sent to Dr. Lovena Le.   3 month ICM trend: 06/21/2017    1 Year ICM trend:       Rosalene Billings, RN 06/21/2017 9:29 AM

## 2017-06-22 ENCOUNTER — Telehealth: Payer: Self-pay

## 2017-06-22 NOTE — Telephone Encounter (Signed)
Remote ICM transmission received.  Attempted call to wife and left detailed message per DPR regarding transmission and next ICM scheduled for 07/22/2017.  Advised to return call for any fluid symptoms or questions.

## 2017-07-21 ENCOUNTER — Other Ambulatory Visit: Payer: Self-pay | Admitting: Cardiovascular Disease

## 2017-07-21 DIAGNOSIS — I2510 Atherosclerotic heart disease of native coronary artery without angina pectoris: Secondary | ICD-10-CM

## 2017-07-21 DIAGNOSIS — I5022 Chronic systolic (congestive) heart failure: Secondary | ICD-10-CM

## 2017-07-21 DIAGNOSIS — I10 Essential (primary) hypertension: Secondary | ICD-10-CM

## 2017-07-22 ENCOUNTER — Other Ambulatory Visit: Payer: Self-pay | Admitting: Cardiovascular Disease

## 2017-07-22 ENCOUNTER — Ambulatory Visit (INDEPENDENT_AMBULATORY_CARE_PROVIDER_SITE_OTHER): Payer: Medicare Other

## 2017-07-22 DIAGNOSIS — Z95810 Presence of automatic (implantable) cardiac defibrillator: Secondary | ICD-10-CM | POA: Diagnosis not present

## 2017-07-22 DIAGNOSIS — I5022 Chronic systolic (congestive) heart failure: Secondary | ICD-10-CM

## 2017-07-22 DIAGNOSIS — I2510 Atherosclerotic heart disease of native coronary artery without angina pectoris: Secondary | ICD-10-CM

## 2017-07-22 DIAGNOSIS — I10 Essential (primary) hypertension: Secondary | ICD-10-CM

## 2017-07-23 NOTE — Progress Notes (Signed)
EPIC Encounter for ICM Monitoring  Patient Name: [name] is a 71 y.o. male Date: 07/23/2017 Primary Care Physican: Patient, No Pcp Per Primary Cardiologist: Burt Knack Electrophysiologist: Lovena Le Dry Weight:Previous weight 034KBT          Transmission received.   Thoracic impedance normal.  Prescribed dosage: Furosemide 40 mg 1 tablet daily. Not taking as prescribed. He is taking Furosemide every other day because he has difficulty walking to the bathroom due to neuropathy. Encouraged to take as prescribed.   Labs: 11/06/2016 Creatinine 1.28, BUN 12, Potassium 4.7, Sodium 141, EGFR 57-66  08/26/2016 Creatinine 1.48, BUN 18, Potassium 5.0, Sodium 141, EGFR 48-55  Recommendations: None.  Follow-up plan: ICM clinic phone appointment on 08/23/2017.    Copy of ICM check sent to Dr. Lovena Le.   3 month ICM trend: 07/22/2017    1 Year ICM trend:       Rosalene Billings, RN 07/23/2017 2:55 PM

## 2017-08-05 ENCOUNTER — Telehealth: Payer: Self-pay | Admitting: Neurology

## 2017-08-05 MED ORDER — GABAPENTIN 400 MG PO CAPS: 800.0000 mg | ORAL_CAPSULE | Freq: Three times a day (TID) | ORAL | 1 refills | Status: AC

## 2017-08-05 NOTE — Telephone Encounter (Signed)
Received VO from Dr. Terrace ArabiaYan: Molli Knockkay to switch patient to gabapentin 400mg  capsule (2 capsules TID) to equal current dose pt is on.  Would like rx sent to:[id number] [place],  - [address]  I called and spoke with wife. Relayed above information. She verbalized understanding and appreciation for call.  She states patient having a hard time swallowing tablets. Denies aspiration/choking. Recommendation taking with bite of apple sauce or pudding if he can if they would like to use up the tablets.

## 2017-08-05 NOTE — Telephone Encounter (Signed)
Pts wife called wanting to know if pt could be switched from   gabapentin (NEURONTIN) 800 MG tablet  To the capsules. She said pt was having a hard time getting them down.

## 2017-08-23 ENCOUNTER — Ambulatory Visit (INDEPENDENT_AMBULATORY_CARE_PROVIDER_SITE_OTHER): Payer: Commercial Managed Care - HMO

## 2017-08-23 DIAGNOSIS — I5022 Chronic systolic (congestive) heart failure: Secondary | ICD-10-CM

## 2017-08-23 DIAGNOSIS — Z95810 Presence of automatic (implantable) cardiac defibrillator: Secondary | ICD-10-CM

## 2017-08-23 NOTE — Progress Notes (Signed)
EPIC Encounter for ICM Monitoring  Patient Name: [name] is a 72 y.o. male Date: 08/23/2017 Primary Care Physican: Patient, No Pcp Per Primary Cardiologist: Burt Knack Electrophysiologist: Druscilla Brownie Weight:207lbs (weight is stable)       Spoke with wife.  Heart Failure questions reviewed, pt tires easily and coughing spells but no worse than usual.  Wife is concerned patients conditioning is worsening and wanted to know if Dr Burt Knack will order tests at next visit to check his heart.      Thoracic impedance abnormal suggesting fluid accumulation last 2 days.  Prescribed dosage: Furosemide 40 mg 1 tablet daily. Not taking as prescribed.  He is taking Furosemide every other day because he has difficulty walking to the bathroom due to neuropathy. Encouraged to take as prescribed.   Labs: 11/06/2016 Creatinine 1.28, BUN 12, Potassium 4.7, Sodium 141, EGFR 57-66  08/26/2016 Creatinine 1.48, BUN 18, Potassium 5.0, Sodium 141, EGFR 48-55  Recommendations:  Advised Dr Burt Knack will determine at the office appointment if any testing is needed for evaluation.   Encouraged to call for fluid symptoms.  Follow-up plan: ICM clinic phone appointment on 09/23/2017.  Office appointment scheduled 09/27/2017 with Dr. Burt Knack.  Copy of ICM check sent to Dr. Lovena Le and Dr. Burt Knack.   3 month ICM trend: 08/23/2017    1 Year ICM trend:       Rosalene Billings, RN 08/23/2017 12:10 PM

## 2017-09-23 ENCOUNTER — Ambulatory Visit (INDEPENDENT_AMBULATORY_CARE_PROVIDER_SITE_OTHER): Payer: Commercial Managed Care - HMO

## 2017-09-23 DIAGNOSIS — Z95810 Presence of automatic (implantable) cardiac defibrillator: Secondary | ICD-10-CM

## 2017-09-23 DIAGNOSIS — I5022 Chronic systolic (congestive) heart failure: Secondary | ICD-10-CM | POA: Diagnosis not present

## 2017-09-24 NOTE — Progress Notes (Signed)
EPIC Encounter for ICM Monitoring  Patient Name: [name] is a 72 y.o. male Date: 09/24/2017 Primary Care Physican: Patient, No Pcp Per Primary Cardiologist: Burt Knack Electrophysiologist: Druscilla Brownie Weight:214lbs        Spoke with wife.   Heart Failure questions reviewed, pt gained 3-4 lbs in the last week and can tell he has some fluid.   Thoracic impedance abnormal suggesting fluid accumulation since 09/14/2017.  Prescribed dosage: Furosemide 40 mg 1 tablet daily. Not taking as prescribed.  He skips Furosemide dosages due to difficulty walking to the bathroom due to neuropathy. Encouraged to take as prescribed.   Labs: 11/06/2016 Creatinine 1.28, BUN 12, Potassium 4.7, Sodium 141, EGFR 57-66  08/26/2016 Creatinine 1.48, BUN 18, Potassium 5.0, Sodium 141, EGFR 48-55  Recommendations: Advised Dr Burt Knack will provide recommendations if needed at office appointment 09/27/2017.  Follow-up plan: ICM clinic phone appointment on 09/30/2017 to recheck fluid levels.  Office appointment scheduled 09/27/2017 with Dr. Burt Knack.  Copy of ICM check sent to Dr. Lovena Le and Dr. Burt Knack for review..   3 month ICM trend: 09/23/2017    1 Year ICM trend:       Rosalene Billings, RN 09/24/2017 12:25 PM

## 2017-09-27 ENCOUNTER — Encounter: Payer: Self-pay | Admitting: Cardiovascular Disease

## 2017-09-27 ENCOUNTER — Ambulatory Visit: Payer: Medicare HMO | Admitting: Cardiovascular Disease

## 2017-09-27 VITALS — BP 128/54 | HR 72 | Ht 71.0 in | Wt 215.8 lb

## 2017-09-27 DIAGNOSIS — I5022 Chronic systolic (congestive) heart failure: Secondary | ICD-10-CM

## 2017-09-27 DIAGNOSIS — I2510 Atherosclerotic heart disease of native coronary artery without angina pectoris: Secondary | ICD-10-CM

## 2017-09-27 DIAGNOSIS — I10 Essential (primary) hypertension: Secondary | ICD-10-CM | POA: Diagnosis not present

## 2017-09-27 LAB — CBC WITH DIFFERENTIAL/PLATELET
Basophils Absolute: 0 10*3/uL (ref 0.0–0.2)
Basos: 0 %
EOS (ABSOLUTE): 0.1 10*3/uL (ref 0.0–0.4)
Eos: 2 %
Hematocrit: 42.8 % (ref 37.5–51.0)
Hemoglobin: 14.7 g/dL (ref 13.0–17.7)
Immature Grans (Abs): 0 10*3/uL (ref 0.0–0.1)
Immature Granulocytes: 0 %
LYMPHS ABS: 1.7 10*3/uL (ref 0.7–3.1)
Lymphs: 26 %
MCH: 30.8 pg (ref 26.6–33.0)
MCHC: 34.3 g/dL (ref 31.5–35.7)
MCV: 90 fL (ref 79–97)
MONOS ABS: 0.5 10*3/uL (ref 0.1–0.9)
Monocytes: 8 %
Neutrophils Absolute: 4.3 10*3/uL (ref 1.4–7.0)
Neutrophils: 64 %
PLATELETS: 227 10*3/uL (ref 150–379)
RBC: 4.78 x10E6/uL (ref 4.14–5.80)
RDW: 13.3 % (ref 12.3–15.4)
WBC: 6.7 10*3/uL (ref 3.4–10.8)

## 2017-09-27 LAB — LIPID PANEL
CHOL/HDL RATIO: 3.8 ratio (ref 0.0–5.0)
Cholesterol, Total: 139 mg/dL (ref 100–199)
HDL: 37 mg/dL — AB (ref >39)
LDL CALC: 70 mg/dL (ref 0–99)
Triglycerides: 160 mg/dL — ABNORMAL HIGH (ref 0–149)
VLDL CHOLESTEROL CAL: 32 mg/dL (ref 5–40)

## 2017-09-27 LAB — COMPREHENSIVE METABOLIC PANEL
ALT: 34 IU/L (ref 0–44)
AST: 31 IU/L (ref 0–40)
Albumin/Globulin Ratio: 1.4 (ref 1.2–2.2)
Albumin: 4 g/dL (ref 3.5–4.8)
Alkaline Phosphatase: 105 IU/L (ref 39–117)
BUN/Creatinine Ratio: 7 — ABNORMAL LOW (ref 10–24)
BUN: 10 mg/dL (ref 8–27)
Bilirubin Total: 0.8 mg/dL (ref 0.0–1.2)
CALCIUM: 9.2 mg/dL (ref 8.6–10.2)
CO2: 24 mmol/L (ref 20–29)
Chloride: 100 mmol/L (ref 96–106)
Creatinine, Ser: 1.38 mg/dL — ABNORMAL HIGH (ref 0.76–1.27)
GFR, EST AFRICAN AMERICAN: 59 mL/min/{1.73_m2} — AB (ref >59)
GFR, EST NON AFRICAN AMERICAN: 51 mL/min/{1.73_m2} — AB (ref >59)
GLUCOSE: 120 mg/dL — AB (ref 65–99)
Globulin, Total: 2.8 g/dL (ref 1.5–4.5)
Potassium: 4.8 mmol/L (ref 3.5–5.2)
Sodium: 139 mmol/L (ref 134–144)
TOTAL PROTEIN: 6.8 g/dL (ref 6.0–8.5)

## 2017-09-27 LAB — PRO B NATRIURETIC PEPTIDE: NT-PRO BNP: 336 pg/mL (ref 0–376)

## 2017-09-27 NOTE — Progress Notes (Signed)
Cardiology Office Note Date:  09/27/2017   ID:  [name], DOB 12-09-1945, MRN [id number]  PCP:  Patient, No Pcp Per  Cardiologist:  Tonny BollmanMichael Kielee Care, MD    Chief Complaint  Patient presents with  . Chronic systolic heart failure     History of Present Illness: [name] LucksHarvey R [name] is a 72 y.o. male who presents for follow-up of chronic systolic failure.  The patient initially presented in 2009 with an anterior wall MI complicated by out of hospital cardiac arrest.  He underwent primary PCI of the proximal LAD and has severe residual LV systolic dysfunction.  He ultimately underwent ICD implantation.  He is here with his wife today for follow-up evaluation.  He continues to have mild shortness of breath with activity and when he first lies down at night.  He is able to sleep flat or on his left side.  He denies any chest pain or pressure.  Denies leg swelling, orthopnea, or PND.  He is continued to gain weight and is not following a prudent diet.  He says that most days at lunch eats 2 sandwiches with mayonnaise, a bag of chips, and a small Coke.  He is compliant with his medications.  He has developed progressive problems related to neuropathy and really has a difficult time doing much walking.   Past Medical History:  Diagnosis Date  . CAD (coronary artery disease)    status post anterior MI 2008 w V. fib arrest // LHC 8/17: LM 30, pLAD stent ok, LCx ok, pRCA 10  . Carotid artery disease (HCC)    Carotid US 12/13: Bilateral ICA 0-39%  . Dyslipidemia   . History of acute anterior wall MI 2009   s/p stenting to LAD  . Hypertension   . Ischemic cardiomyopathy    a. Echo 9/17: Mild LVH, EF 25-30, ant-septal, inf-septal, apical AK, apical inf AK, severe ant HK, Gr 1 DD, mildly dilated aortic root and ascending aorta (asc Ao 38 mm, Ao root 39 mm), mild reduced RVSF  . Macular degeneration   . Other vitamin B12 deficiency anemia 08/14/2013  . Polyneuropathy in other diseases  classified elsewhere (HCC) 02/12/2014  . Tremor, essential 02/03/2016  . 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 CARDIAC CATHETERIZATION  09/12/2007  . CARDIAC CATHETERIZATION N/A 03/26/2016   Procedure: Right/Left Heart Cath and Coronary Angiography;  Surgeon: Yvonne Kendallhristopher End, MD;  [facility];  Service: Cardiovascular;  Laterality: N/A;  . CATARACT EXTRACTION Bilateral   . ICD GENERATOR CHANGEOUT N/A 11/13/2016   Procedure: ICD Generator Changeout;  Surgeon: Marinus MawGregg W Taylor, MD;  [facility];  Service: Cardiovascular;  Laterality: N/A;  . ICD medtronic implanted  07/06/08   Dr. Sharrell KuGreg Taylor    Current Outpatient Medications  Medication Sig Dispense Refill  . acetaminophen (TYLENOL) 500 MG tablet Take 500-1,000 mg by mouth every 6 (six) hours as needed for moderate pain or headache.    Marland Kitchen. aspirin (ASPIR-81) 81 MG EC tablet Take 81 mg by mouth daily.      . carvedilol (COREG) 25 MG tablet TAKE 0.5 TABLETS (12.5 MG TOTAL) BY MOUTH 2 (TWO) TIMES DAILY. 30 tablet 7  . digoxin (LANOXIN) 0.125 MG tablet TAKE 1 TABLET BY MOUTH DAILY. 30 tablet 6  . furosemide (LASIX) 40 MG tablet TAKE 1 TABLET (40 MG TOTAL) BY MOUTH DAILY. 30 tablet 11  . gabapentin (NEURONTIN)  400 MG capsule Take 2 capsules (800 mg total) by mouth 3 (three) times daily. 180 capsule 1  . losartan (COZAAR) 25 MG tablet Take 25 mg by mouth daily.  8  . nitroGLYCERIN (NITROSTAT) 0.4 MG SL tablet Place 1 tablet (0.4 mg total) under the tongue every 5 (five) minutes as needed for chest pain (MAX 3 TABLETS). 25 tablet 3  . pantoprazole (PROTONIX) 40 MG tablet TAKE 1 TABLET (40 MG TOTAL) BY MOUTH DAILY. 30 tablet 11  . simvastatin (ZOCOR) 40 MG tablet TAKE 1 TABLET (40 MG TOTAL) BY MOUTH AT BEDTIME. 30 tablet 7  . vitamin B-12 (CYANOCOBALAMIN) 1000 MCG tablet Take 1,000 mcg by mouth daily.     No current facility-administered medications for this  visit.     Allergies:   Cymbalta [duloxetine hcl]   Social History:  The patient  reports that he has quit smoking. he has never used smokeless tobacco. He reports that he drinks about 4.2 oz of alcohol per week. He reports that he does not use drugs.   Family History:  The patient's family history includes Cancer - Colon in his sister; Heart attack in his father; Liver disease in his brother; Neuropathy in his sister and sister.    ROS:  Please see the history of present illness.  Otherwise, review of systems is positive for back pain, leg pain, wheezing, balance problems.  All other systems are reviewed and negative.    PHYSICAL EXAM: VS:  BP (!) 128/54   Pulse 72   Ht 5\' 11"  (1.803 m)   Wt 215 lb 12.8 oz (97.9 kg)   BMI 30.10 kg/m  , BMI Body mass index is 30.1 kg/m. GEN: Well nourished, well developed, in no acute distress  HEENT: normal  Neck: no JVD, no masses. No carotid bruits Cardiac: RRR without murmur or gallop      Respiratory:  clear to auscultation bilaterally, normal work of breathing GI: soft, nontender, nondistended, + BS MS: no deformity or atrophy  Ext: no pretibial edema, pedal pulses 2+= bilaterally Skin: warm and dry, no rash Neuro:  Strength and sensation are intact Psych: euthymic mood, full affect  EKG:  EKG is ordered today. The ekg ordered today shows *normal sinus rhythm 72 bpm, age-indeterminate anteroseptal infarct, ST and T wave abnormality consider anterolateral ischemia  Recent Labs: 11/06/2016: BUN 13; Creatinine, Ser 1.28; Hemoglobin 14.7; Platelets 189; Potassium 4.7; Sodium 141   Lipid Panel     Component Value Date/Time   CHOL 146 08/26/2016 0922   TRIG 208 (H) 08/26/2016 0922   HDL 34 (L) 08/26/2016 0922   CHOLHDL 4.3 08/26/2016 0922   CHOLHDL 3.2 06/24/2015 0748   VLDL 31 (H) 06/24/2015 0748   LDLCALC 70 08/26/2016 0922      Wt Readings from Last 3 Encounters:  09/27/17 215 lb 12.8 oz (97.9 kg)  06/03/17 212 lb (96.2 kg)    03/04/17 209 lb 12.8 oz (95.2 kg)     Cardiac Studies Reviewed: Cardiac Cath 03-26-2016: Conclusion    Widely patent stent in ostial/proximal LAD.  Mild to moderate, non-obstructive coronary artery disease involving proximal RCA and distal LMCA.  Mildly elevated left ventricular filling pressure, right ventricular filling pressure, and pulmonary artery pressure.  Normal Fick Cardiac output.  Plan:  Continue aggressive secondary prevention and medical therapy, including escalation of afterload reduction given systemic hypertension and elevated filling pressures.  Outpatient follow-up with Dr. Excell Seltzer as previously planned.   ASSESSMENT AND PLAN: 1.  Chronic  systolic heart failure, New York Heart Association functional class II symptoms: Primarily limited by neuropathy at present.  Medications are reviewed and will be continued without change.  I am going to draw labs to assess renal function and BNP today.  His most recent device check  demonstrated some increase in fluid level.  I may increase his furosemide to twice daily for a few days but will wait to see what his labs show.  He is counseled extensively about sodium restriction as he is eating a lot of salt at present.  2.  Coronary artery disease, native vessel, without angina: The patient's stable in this regard.  He will continue on aspirin, beta-blocker, and a statin drug.  3.  Hyperlipidemia: His last LDL cholesterol is 70 mg/dL 1 year ago.  He is fasting this morning for labs.  Continue on simvastatin.  4.  Hypertension: Blood pressure is well controlled on carvedilol, furosemide, and losartan.  Current medicines are reviewed with the patient today.  The patient does not have concerns regarding medicines.  Labs/ tests ordered today include:   Orders Placed This Encounter  Procedures  . Comprehensive metabolic panel  . Lipid panel  . CBC with Differential/Platelet  . Pro b natriuretic peptide (BNP)  . EKG 12-Lead     Disposition:   FU 6 months  Signed, Tonny Bollman, MD  09/27/2017 1:25 PM    [facility] [address] [place], [place], [place]  [address] Phone: [phone]; Fax: [phone]

## 2017-09-27 NOTE — Patient Instructions (Signed)
Medication Instructions:  Your provider recommends that you continue on your current medications as directed. Please refer to the Current Medication list given to you today.    Labwork: TODAY: CMET, Lipids, CBC, BNP  Testing/Procedures: No new orders  Follow-Up: Your provider wants you to follow-up in: 6 months with Dr. Excell Seltzerooper. You will receive a reminder letter in the mail two months in advance. If you don't receive a letter, please call our office to schedule the follow-up appointment.    Any Other Special Instructions Will Be Listed Below (If Applicable).     If you need a refill on your cardiac medications before your next appointment, please call your pharmacy.

## 2017-09-30 ENCOUNTER — Ambulatory Visit (INDEPENDENT_AMBULATORY_CARE_PROVIDER_SITE_OTHER): Payer: Medicare HMO

## 2017-09-30 DIAGNOSIS — I5022 Chronic systolic (congestive) heart failure: Secondary | ICD-10-CM

## 2017-09-30 DIAGNOSIS — Z95810 Presence of automatic (implantable) cardiac defibrillator: Secondary | ICD-10-CM

## 2017-09-30 NOTE — Progress Notes (Signed)
EPIC Encounter for ICM Monitoring  Patient Name: [name] is a 72 y.o. male Date: 09/30/2017 Primary Care Physican: Patient, No Pcp Per Primary Cardiologist: Burt Knack Electrophysiologist: Lovena Le Dry Weight:210lbs        Spoke withwife.   Heart Failure questions reviewed, pt asymptomatic.   Thoracic impedance abnormal suggesting fluid accumulation since 09/13/2017 but is at baseline today.  Prescribed dosage: Furosemide 40 mg 1 tablet daily.   Labs: 11/06/2016 Creatinine 1.28, BUN 12, Potassium 4.7, Sodium 141, EGFR 57-66  08/26/2016 Creatinine 1.48, BUN 18, Potassium 5.0, Sodium 141, EGFR 48-55  Recommendations:  No changes.  Encouraged to call for fluid symptoms.  Follow-up plan: ICM clinic phone appointment on 10/25/2017.    Copy of ICM check sent to Dr. Lovena Le.   3 month ICM trend: 09/30/2017    1 Year ICM trend:       Rosalene Billings, RN 09/30/2017 12:05 PM

## 2017-10-25 ENCOUNTER — Ambulatory Visit (INDEPENDENT_AMBULATORY_CARE_PROVIDER_SITE_OTHER): Payer: Medicare HMO

## 2017-10-25 DIAGNOSIS — Z95810 Presence of automatic (implantable) cardiac defibrillator: Secondary | ICD-10-CM

## 2017-10-25 DIAGNOSIS — I5022 Chronic systolic (congestive) heart failure: Secondary | ICD-10-CM

## 2017-10-25 NOTE — Progress Notes (Signed)
EPIC Encounter for ICM Monitoring  Patient Name: [name] is a 72 y.o. male Date: 10/25/2017 Primary Care Physican: Patient, No Pcp Per Primary Cardiologist: Burt Knack Electrophysiologist: Druscilla Brownie Weight:210lbs         .Spoke with wife.  Heart Failure questions reviewed, pt asymptomatic.  She said he is sedentary. He is always [name] of breath when walking but no worse than usual.     Thoracic impedance normal.  Prescribed dosage: Furosemide 40 mg 1 tablet daily.   Labs: 09/27/2017 Creatinine 1.38, BUN 10, Potassium 4.8, Sodium 139, EGFR 51-59 11/06/2016 Creatinine 1.28, BUN 12, Potassium 4.7, Sodium 141, EGFR 57-66  08/26/2016 Creatinine 1.48, BUN 18, Potassium 5.0, Sodium 141, EGFR 48-55  Recommendations: No changes.  Encouraged to call for fluid symptoms.  Follow-up plan: ICM clinic phone appointment on 11/25/2017.    Copy of ICM check sent to Dr. Lovena Le.   3 month ICM trend: 10/25/2017    1 Year ICM trend:       Rosalene Billings, RN 10/25/2017 12:10 PM

## 2017-11-10 ENCOUNTER — Telehealth: Payer: Self-pay | Admitting: Cardiovascular Disease

## 2017-11-10 ENCOUNTER — Telehealth: Payer: Self-pay | Admitting: *Deleted

## 2017-11-10 NOTE — Telephone Encounter (Signed)
New Message    Patient spouse is calling on his behalf of the patient. She states that he husband was shaking last night pretty bad. She advises that his entire body was checking. She also indicated that he had a fever of 100. This has occurred within the last 24 hours. Please call to discuss.

## 2017-11-10 NOTE — Telephone Encounter (Signed)
Patient's wife reports the patient has not been feeling very well lately. He was constipated for a while and now has diarrhea. Last night, he started shivering a lot. She checked his temperature and it was 100 degrees. She thinks he may be weaker than usual because he stopped taking his Lasix 2 days ago because he didn't want to walk to the bathroom. She has not seen him try to walk. She denies he has shortness of breath or swelling, but says he may be breathing a little faster.  Encouraged her to get him a urinal and to have him restart his Lasix. Instructed her to call PCP if diarrhea persists as he may have an infection. She will monitor symptoms and will call if problems occur or do not improve.

## 2017-11-10 NOTE — Telephone Encounter (Signed)
Spoke with patient's wife.  She is aware the appointments are pushed out for Dr. Beverely Lowabori, but that she will accept him.   However, he needs evaluation sooner than what she can see him.   Advised Urgent Care, ER or call the neurologist.    She stated that she is waiting for him to get up and she will see how he walks and will call the neurologist..  She is aware that his condition could be something serious, but she states that she has a hard time getting him to agree to be seen.     She is going to try to get him to go be seen.  Will call us back if she needs anything further.   NP appointment scheduled for 12/16/17 at 10:00am -   Copied from CRM [id number]. Topic: Appointment Scheduling - Scheduling Inquiry for Clinic >> Nov 10, 2017 12:50 PM Lorrine KinMcGee, Demi B, [place] wrote: Reason for CRM: Patient's wife (Patient of Dr Beverely Lowabori) called stating that her husband is a heart patient and has neuropathy. Yesterday had an episode of really back shakes and could not walk more than 3 feet. They contacted his cardiologist and spoke with the nurse who advised to speak with his pcp. His wife is hoping that Dr. Beverely Lowabori could take him on as a patient as he has been dealing with stomach pain as well.   >> Nov 10, 2017 12:56 PM Lenis Dickinsonillard, Leotha Voeltz M, CMA wrote: Routing to Dr. Beverely Lowabori to advise >> Nov 10, 2017  1:09 PM Sheliah Hatchabori, Katherine E, MD wrote: Since he is married to a current pt, I will accept him BUT given the seriousness of his symptoms, he needs to be evaluated in the ER or [facility] sooner than I am able to get him in.  He could also call his neurologist regarding this issue.

## 2017-11-11 ENCOUNTER — Inpatient Hospital Stay (HOSPITAL_COMMUNITY): Payer: Medicare HMO

## 2017-11-11 ENCOUNTER — Inpatient Hospital Stay (HOSPITAL_COMMUNITY)
Admission: EM | Admit: 2017-11-11 | Discharge: 2017-12-01 | DRG: 871 | Disposition: E | Payer: Medicare HMO | Attending: Pulmonary Disease | Admitting: Pulmonary Disease

## 2017-11-11 ENCOUNTER — Emergency Department (HOSPITAL_COMMUNITY): Payer: Medicare HMO

## 2017-11-11 ENCOUNTER — Ambulatory Visit (HOSPITAL_COMMUNITY): Payer: Medicare HMO

## 2017-11-11 ENCOUNTER — Other Ambulatory Visit: Payer: Self-pay

## 2017-11-11 ENCOUNTER — Encounter (HOSPITAL_COMMUNITY): Payer: Self-pay

## 2017-11-11 DIAGNOSIS — I2510 Atherosclerotic heart disease of native coronary artery without angina pectoris: Secondary | ICD-10-CM | POA: Diagnosis present

## 2017-11-11 DIAGNOSIS — K5900 Constipation, unspecified: Secondary | ICD-10-CM | POA: Diagnosis present

## 2017-11-11 DIAGNOSIS — R531 Weakness: Secondary | ICD-10-CM | POA: Diagnosis not present

## 2017-11-11 DIAGNOSIS — Z7982 Long term (current) use of aspirin: Secondary | ICD-10-CM | POA: Diagnosis not present

## 2017-11-11 DIAGNOSIS — Z978 Presence of other specified devices: Secondary | ICD-10-CM

## 2017-11-11 DIAGNOSIS — I5022 Chronic systolic (congestive) heart failure: Secondary | ICD-10-CM | POA: Diagnosis present

## 2017-11-11 DIAGNOSIS — G9341 Metabolic encephalopathy: Secondary | ICD-10-CM | POA: Diagnosis not present

## 2017-11-11 DIAGNOSIS — I255 Ischemic cardiomyopathy: Secondary | ICD-10-CM | POA: Diagnosis present

## 2017-11-11 DIAGNOSIS — R918 Other nonspecific abnormal finding of lung field: Secondary | ICD-10-CM | POA: Diagnosis not present

## 2017-11-11 DIAGNOSIS — I447 Left bundle-branch block, unspecified: Secondary | ICD-10-CM | POA: Diagnosis not present

## 2017-11-11 DIAGNOSIS — Z87891 Personal history of nicotine dependence: Secondary | ICD-10-CM | POA: Diagnosis not present

## 2017-11-11 DIAGNOSIS — E669 Obesity, unspecified: Secondary | ICD-10-CM | POA: Diagnosis present

## 2017-11-11 DIAGNOSIS — K4000 Bilateral inguinal hernia, with obstruction, without gangrene, not specified as recurrent: Secondary | ICD-10-CM | POA: Diagnosis not present

## 2017-11-11 DIAGNOSIS — I252 Old myocardial infarction: Secondary | ICD-10-CM

## 2017-11-11 DIAGNOSIS — I110 Hypertensive heart disease with heart failure: Secondary | ICD-10-CM | POA: Diagnosis present

## 2017-11-11 DIAGNOSIS — J9601 Acute respiratory failure with hypoxia: Secondary | ICD-10-CM | POA: Diagnosis present

## 2017-11-11 DIAGNOSIS — Z95810 Presence of automatic (implantable) cardiac defibrillator: Secondary | ICD-10-CM

## 2017-11-11 DIAGNOSIS — R6521 Severe sepsis with septic shock: Secondary | ICD-10-CM | POA: Diagnosis not present

## 2017-11-11 DIAGNOSIS — Z4682 Encounter for fitting and adjustment of non-vascular catheter: Secondary | ICD-10-CM | POA: Diagnosis not present

## 2017-11-11 DIAGNOSIS — H3530 Unspecified macular degeneration: Secondary | ICD-10-CM | POA: Diagnosis present

## 2017-11-11 DIAGNOSIS — N179 Acute kidney failure, unspecified: Secondary | ICD-10-CM

## 2017-11-11 DIAGNOSIS — Z888 Allergy status to other drugs, medicaments and biological substances status: Secondary | ICD-10-CM | POA: Diagnosis not present

## 2017-11-11 DIAGNOSIS — A419 Sepsis, unspecified organism: Principal | ICD-10-CM | POA: Diagnosis present

## 2017-11-11 DIAGNOSIS — E872 Acidosis: Secondary | ICD-10-CM | POA: Diagnosis present

## 2017-11-11 DIAGNOSIS — R05 Cough: Secondary | ICD-10-CM | POA: Diagnosis not present

## 2017-11-11 DIAGNOSIS — E785 Hyperlipidemia, unspecified: Secondary | ICD-10-CM | POA: Diagnosis present

## 2017-11-11 DIAGNOSIS — K559 Vascular disorder of intestine, unspecified: Secondary | ICD-10-CM | POA: Diagnosis not present

## 2017-11-11 DIAGNOSIS — Z79899 Other long term (current) drug therapy: Secondary | ICD-10-CM | POA: Diagnosis not present

## 2017-11-11 DIAGNOSIS — R197 Diarrhea, unspecified: Secondary | ICD-10-CM | POA: Diagnosis not present

## 2017-11-11 DIAGNOSIS — Z66 Do not resuscitate: Secondary | ICD-10-CM | POA: Diagnosis not present

## 2017-11-11 DIAGNOSIS — I5030 Unspecified diastolic (congestive) heart failure: Secondary | ICD-10-CM | POA: Diagnosis not present

## 2017-11-11 DIAGNOSIS — J9600 Acute respiratory failure, unspecified whether with hypoxia or hypercapnia: Secondary | ICD-10-CM | POA: Diagnosis not present

## 2017-11-11 DIAGNOSIS — R109 Unspecified abdominal pain: Secondary | ICD-10-CM | POA: Diagnosis not present

## 2017-11-11 DIAGNOSIS — Z6833 Body mass index (BMI) 33.0-33.9, adult: Secondary | ICD-10-CM | POA: Diagnosis not present

## 2017-11-11 DIAGNOSIS — R1011 Right upper quadrant pain: Secondary | ICD-10-CM | POA: Diagnosis not present

## 2017-11-11 LAB — COMPREHENSIVE METABOLIC PANEL
ALT: 19 U/L (ref 17–63)
AST: 32 U/L (ref 15–41)
Albumin: 2.5 g/dL — ABNORMAL LOW (ref 3.5–5.0)
Alkaline Phosphatase: 50 U/L (ref 38–126)
Anion gap: 17 — ABNORMAL HIGH (ref 5–15)
BILIRUBIN TOTAL: 2.3 mg/dL — AB (ref 0.3–1.2)
BUN: 36 mg/dL — AB (ref 6–20)
CO2: 17 mmol/L — ABNORMAL LOW (ref 22–32)
CREATININE: 4.6 mg/dL — AB (ref 0.61–1.24)
Calcium: 8.3 mg/dL — ABNORMAL LOW (ref 8.9–10.3)
Chloride: 105 mmol/L (ref 101–111)
GFR calc Af Amer: 14 mL/min — ABNORMAL LOW (ref >60)
GFR, EST NON AFRICAN AMERICAN: 12 mL/min — AB (ref >60)
GLUCOSE: 96 mg/dL (ref 65–99)
Potassium: 5 mmol/L (ref 3.5–5.1)
Sodium: 139 mmol/L (ref 135–145)
Total Protein: 6.5 g/dL (ref 6.5–8.1)

## 2017-11-11 LAB — I-STAT CHEM 8, ED
BUN: 37 mg/dL — AB (ref 6–20)
CHLORIDE: 108 mmol/L (ref 101–111)
CREATININE: 4.4 mg/dL — AB (ref 0.61–1.24)
Calcium, Ion: 0.98 mmol/L — ABNORMAL LOW (ref 1.15–1.40)
Glucose, Bld: 97 mg/dL (ref 65–99)
HEMATOCRIT: 42 % (ref 39.0–52.0)
Hemoglobin: 14.3 g/dL (ref 13.0–17.0)
POTASSIUM: 4.9 mmol/L (ref 3.5–5.1)
SODIUM: 140 mmol/L (ref 135–145)
TCO2: 20 mmol/L — AB (ref 22–32)

## 2017-11-11 LAB — STREP PNEUMONIAE URINARY ANTIGEN: Strep Pneumo Urinary Antigen: NEGATIVE

## 2017-11-11 LAB — LACTIC ACID, PLASMA
Lactic Acid, Venous: 4.6 mmol/L (ref 0.5–1.9)
Lactic Acid, Venous: 4.5 mmol/L (ref 0.5–1.9)

## 2017-11-11 LAB — BLOOD GAS, ARTERIAL
ACID-BASE DEFICIT: 4.6 mmol/L — AB (ref 0.0–2.0)
Bicarbonate: 21 mmol/L (ref 20.0–28.0)
DRAWN BY: 520751
FIO2: 50
LHR: 24 {breaths}/min
O2 SAT: 96.5 %
PCO2 ART: 46 mmHg (ref 32.0–48.0)
PEEP/CPAP: 5 cmH2O
PH ART: 7.282 — AB (ref 7.350–7.450)
Patient temperature: 98.6
VT: 600 mL
pO2, Arterial: 97.7 mmHg (ref 83.0–108.0)

## 2017-11-11 LAB — CBC WITH DIFFERENTIAL/PLATELET
BAND NEUTROPHILS: 0 %
BASOS ABS: 0 10*3/uL (ref 0.0–0.1)
Basophils Relative: 0 %
Blasts: 0 %
EOS ABS: 0 10*3/uL (ref 0.0–0.7)
EOS PCT: 0 %
HCT: 44.9 % (ref 39.0–52.0)
Hemoglobin: 14.3 g/dL (ref 13.0–17.0)
LYMPHS ABS: 1.4 10*3/uL (ref 0.7–4.0)
Lymphocytes Relative: 10 %
MCH: 30.7 pg (ref 26.0–34.0)
MCHC: 31.8 g/dL (ref 30.0–36.0)
MCV: 96.4 fL (ref 78.0–100.0)
METAMYELOCYTES PCT: 0 %
Monocytes Absolute: 0.7 10*3/uL (ref 0.1–1.0)
Monocytes Relative: 5 %
Myelocytes: 0 %
Neutro Abs: 12.1 10*3/uL — ABNORMAL HIGH (ref 1.7–7.7)
Neutrophils Relative %: 85 %
Other: 0 %
PLATELETS: 191 10*3/uL (ref 150–400)
Promyelocytes Relative: 0 %
RBC: 4.66 MIL/uL (ref 4.22–5.81)
RDW: 13.9 % (ref 11.5–15.5)
WBC MORPHOLOGY: INCREASED
WBC: 14.2 10*3/uL — AB (ref 4.0–10.5)
nRBC: 0 /100 WBC

## 2017-11-11 LAB — DIGOXIN LEVEL: DIGOXIN LVL: 1.1 ng/mL (ref 0.8–2.0)

## 2017-11-11 LAB — RESPIRATORY PANEL BY PCR
Adenovirus: NOT DETECTED
Bordetella pertussis: NOT DETECTED
CORONAVIRUS 229E-RVPPCR: NOT DETECTED
CORONAVIRUS OC43-RVPPCR: NOT DETECTED
Chlamydophila pneumoniae: NOT DETECTED
Coronavirus HKU1: NOT DETECTED
Coronavirus NL63: NOT DETECTED
INFLUENZA B-RVPPCR: NOT DETECTED
Influenza A: NOT DETECTED
MYCOPLASMA PNEUMONIAE-RVPPCR: NOT DETECTED
Metapneumovirus: NOT DETECTED
PARAINFLUENZA VIRUS 1-RVPPCR: NOT DETECTED
Parainfluenza Virus 2: NOT DETECTED
Parainfluenza Virus 3: NOT DETECTED
Parainfluenza Virus 4: NOT DETECTED
RESPIRATORY SYNCYTIAL VIRUS-RVPPCR: NOT DETECTED
Rhinovirus / Enterovirus: NOT DETECTED

## 2017-11-11 LAB — URINALYSIS, ROUTINE W REFLEX MICROSCOPIC
GLUCOSE, UA: NEGATIVE mg/dL
HGB URINE DIPSTICK: NEGATIVE
Ketones, ur: NEGATIVE mg/dL
NITRITE: NEGATIVE
PH: 5 (ref 5.0–8.0)
Protein, ur: 30 mg/dL — AB
SPECIFIC GRAVITY, URINE: 1.023 (ref 1.005–1.030)

## 2017-11-11 LAB — GLUCOSE, CAPILLARY
Glucose-Capillary: 163 mg/dL — ABNORMAL HIGH (ref 65–99)
Glucose-Capillary: 167 mg/dL — ABNORMAL HIGH (ref 65–99)
Glucose-Capillary: 149 mg/dL — ABNORMAL HIGH (ref 65–99)

## 2017-11-11 LAB — I-STAT TROPONIN, ED: Troponin i, poc: 0.03 ng/mL (ref 0.00–0.08)

## 2017-11-11 LAB — MRSA PCR SCREENING: MRSA by PCR: NEGATIVE

## 2017-11-11 LAB — INFLUENZA PANEL BY PCR (TYPE A & B)
INFLBPCR: NEGATIVE
Influenza A By PCR: NEGATIVE

## 2017-11-11 LAB — PROTIME-INR
INR: 1.38
Prothrombin Time: 16.8 seconds — ABNORMAL HIGH (ref 11.4–15.2)

## 2017-11-11 LAB — I-STAT CG4 LACTIC ACID, ED
LACTIC ACID, VENOUS: 4.95 mmol/L — AB (ref 0.5–1.9)
Lactic Acid, Venous: 6.4 mmol/L (ref 0.5–1.9)

## 2017-11-11 LAB — BRAIN NATRIURETIC PEPTIDE: B Natriuretic Peptide: 299.5 pg/mL — ABNORMAL HIGH (ref 0.0–100.0)

## 2017-11-11 LAB — MAGNESIUM: Magnesium: 1.8 mg/dL (ref 1.7–2.4)

## 2017-11-11 LAB — PHOSPHORUS: PHOSPHORUS: 3.3 mg/dL (ref 2.5–4.6)

## 2017-11-11 MED ORDER — SODIUM BICARBONATE 8.4 % IV SOLN
INTRAVENOUS | Status: AC
  Administered 2017-11-11: 100 meq via INTRAVENOUS
  Filled 2017-11-11: qty 100

## 2017-11-11 MED ORDER — PIPERACILLIN-TAZOBACTAM IN DEX 2-0.25 GM/50ML IV SOLN
2.2500 g | Freq: Three times a day (TID) | INTRAVENOUS | Status: DC
  Administered 2017-11-11 – 2017-11-13 (×6): 2.25 g via INTRAVENOUS
  Filled 2017-11-11 (×7): qty 50

## 2017-11-11 MED ORDER — SODIUM CHLORIDE 0.9 % IV SOLN
250.0000 mL | INTRAVENOUS | Status: DC | PRN
  Administered 2017-11-11 – 2017-11-13 (×2): 250 mL via INTRAVENOUS

## 2017-11-11 MED ORDER — SODIUM BICARBONATE 8.4 % IV SOLN
100.0000 meq | Freq: Once | INTRAVENOUS | Status: AC
  Administered 2017-11-11: 100 meq via INTRAVENOUS

## 2017-11-11 MED ORDER — SODIUM BICARBONATE 8.4 % IV SOLN
INTRAVENOUS | Status: DC
  Administered 2017-11-11 – 2017-11-12 (×5): via INTRAVENOUS
  Filled 2017-11-11 (×9): qty 850

## 2017-11-11 MED ORDER — SODIUM CHLORIDE 0.9 % IV BOLUS
1000.0000 mL | Freq: Once | INTRAVENOUS | Status: AC
  Administered 2017-11-11: 1000 mL via INTRAVENOUS

## 2017-11-11 MED ORDER — MIDAZOLAM HCL 2 MG/2ML IJ SOLN
1.0000 mg | INTRAMUSCULAR | Status: DC | PRN
  Administered 2017-11-11: 1 mg via INTRAVENOUS

## 2017-11-11 MED ORDER — ORAL CARE MOUTH RINSE
15.0000 mL | Freq: Four times a day (QID) | OROMUCOSAL | Status: DC
  Administered 2017-11-11 – 2017-11-12 (×5): 15 mL via OROMUCOSAL

## 2017-11-11 MED ORDER — SODIUM CHLORIDE 0.9 % IV BOLUS
500.0000 mL | Freq: Once | INTRAVENOUS | Status: AC
  Administered 2017-11-11: 500 mL via INTRAVENOUS

## 2017-11-11 MED ORDER — FENTANYL BOLUS VIA INFUSION
25.0000 ug | INTRAVENOUS | Status: DC | PRN
  Administered 2017-11-11: 25 ug via INTRAVENOUS
  Filled 2017-11-11: qty 25

## 2017-11-11 MED ORDER — PHENYLEPHRINE HCL 10 MG/ML IJ SOLN
0.0000 ug/min | INTRAMUSCULAR | Status: DC
  Administered 2017-11-11: 20 ug/min via INTRAVENOUS
  Administered 2017-11-11: 180 ug/min via INTRAVENOUS
  Filled 2017-11-11: qty 1
  Filled 2017-11-11 (×2): qty 10

## 2017-11-11 MED ORDER — SODIUM CHLORIDE 0.9 % IV SOLN
500.0000 mg | INTRAVENOUS | Status: DC
  Administered 2017-11-11: 500 mg via INTRAVENOUS
  Filled 2017-11-11: qty 500

## 2017-11-11 MED ORDER — FENTANYL CITRATE (PF) 100 MCG/2ML IJ SOLN: 50.0000 ug | Freq: Once | INTRAMUSCULAR | Status: DC

## 2017-11-11 MED ORDER — FENTANYL CITRATE (PF) 100 MCG/2ML IJ SOLN: 50.0000 ug | INTRAMUSCULAR | Status: DC | PRN

## 2017-11-11 MED ORDER — PANTOPRAZOLE SODIUM 40 MG IV SOLR
40.0000 mg | Freq: Every day | INTRAVENOUS | Status: DC
  Administered 2017-11-11 – 2017-11-12 (×2): 40 mg via INTRAVENOUS
  Filled 2017-11-11 (×2): qty 40

## 2017-11-11 MED ORDER — NOREPINEPHRINE BITARTRATE 1 MG/ML IV SOLN
0.0000 ug/min | INTRAVENOUS | Status: DC
  Administered 2017-11-11: 60 ug/min via INTRAVENOUS
  Filled 2017-11-11 (×2): qty 4

## 2017-11-11 MED ORDER — ETOMIDATE 2 MG/ML IV SOLN
INTRAVENOUS | Status: AC | PRN
  Administered 2017-11-11: 20 mg via INTRAVENOUS

## 2017-11-11 MED ORDER — SODIUM CHLORIDE 0.9 % IV SOLN
0.0300 [IU]/min | INTRAVENOUS | Status: DC
  Administered 2017-11-11: 0.03 [IU]/min via INTRAVENOUS
  Filled 2017-11-11 (×3): qty 2

## 2017-11-11 MED ORDER — SODIUM BICARBONATE 8.4 % IV SOLN
INTRAVENOUS | Status: AC
  Administered 2017-11-11: 50 meq via INTRAVENOUS
  Filled 2017-11-11: qty 50

## 2017-11-11 MED ORDER — FENTANYL CITRATE (PF) 100 MCG/2ML IJ SOLN
50.0000 ug | Freq: Once | INTRAMUSCULAR | Status: AC
  Administered 2017-11-11: 50 ug via INTRAVENOUS

## 2017-11-11 MED ORDER — CHLORHEXIDINE GLUCONATE 0.12% ORAL RINSE (MEDLINE KIT)
15.0000 mL | Freq: Two times a day (BID) | OROMUCOSAL | Status: DC
  Administered 2017-11-11 – 2017-11-12 (×2): 15 mL via OROMUCOSAL

## 2017-11-11 MED ORDER — VANCOMYCIN HCL IN DEXTROSE 1-5 GM/200ML-% IV SOLN: 1000.0000 mg | INTRAVENOUS | Status: DC

## 2017-11-11 MED ORDER — SODIUM BICARBONATE 8.4 % IV SOLN
INTRAVENOUS | Status: AC | PRN
  Administered 2017-11-11: 50 meq via INTRAVENOUS

## 2017-11-11 MED ORDER — MIDAZOLAM HCL 5 MG/5ML IJ SOLN
INTRAMUSCULAR | Status: AC | PRN
  Administered 2017-11-11: 2 mg via INTRAVENOUS

## 2017-11-11 MED ORDER — MIDAZOLAM HCL 2 MG/2ML IJ SOLN
INTRAMUSCULAR | Status: AC
  Filled 2017-11-11: qty 2

## 2017-11-11 MED ORDER — FENTANYL 2500MCG IN NS 250ML (10MCG/ML) PREMIX INFUSION
25.0000 ug/h | INTRAVENOUS | Status: DC
  Administered 2017-11-11: 50 ug/h via INTRAVENOUS
  Filled 2017-11-11: qty 250

## 2017-11-11 MED ORDER — ACETAMINOPHEN 325 MG PO TABS
650.0000 mg | ORAL_TABLET | ORAL | Status: DC | PRN
  Administered 2017-11-11: 650 mg via ORAL
  Filled 2017-11-11 (×2): qty 2

## 2017-11-11 MED ORDER — HEPARIN SODIUM (PORCINE) 5000 UNIT/ML IJ SOLN
5000.0000 [IU] | Freq: Three times a day (TID) | INTRAMUSCULAR | Status: DC
  Administered 2017-11-11 – 2017-11-13 (×6): 5000 [IU] via SUBCUTANEOUS
  Filled 2017-11-11 (×6): qty 1

## 2017-11-11 MED ORDER — DEXMEDETOMIDINE HCL IN NACL 200 MCG/50ML IV SOLN: 0.0000 ug/kg/h | INTRAVENOUS | Status: DC

## 2017-11-11 MED ORDER — NOREPINEPHRINE BITARTRATE 1 MG/ML IV SOLN
0.0000 ug/min | Freq: Once | INTRAVENOUS | Status: AC
  Administered 2017-11-11: 10 ug/min via INTRAVENOUS
  Filled 2017-11-11 (×2): qty 4

## 2017-11-11 MED ORDER — NOREPINEPHRINE BITARTRATE 1 MG/ML IV SOLN
0.0000 ug/min | INTRAVENOUS | Status: DC
  Administered 2017-11-11: 50 ug/min via INTRAVENOUS
  Administered 2017-11-11: 55 ug/min via INTRAVENOUS
  Administered 2017-11-11: 80 ug/min via INTRAVENOUS
  Administered 2017-11-12: 32 ug/min via INTRAVENOUS
  Administered 2017-11-12: 50 ug/min via INTRAVENOUS
  Administered 2017-11-13: 20 ug/min via INTRAVENOUS
  Filled 2017-11-11 (×7): qty 16

## 2017-11-11 MED ORDER — SODIUM BICARBONATE 8.4 % IV SOLN
50.0000 meq | Freq: Once | INTRAVENOUS | Status: AC
  Administered 2017-11-11 (×2): 50 meq via INTRAVENOUS
  Filled 2017-11-11: qty 50

## 2017-11-11 MED ORDER — CEFTRIAXONE SODIUM 2 G IJ SOLR
2.0000 g | INTRAMUSCULAR | Status: DC
  Administered 2017-11-11: 2 g via INTRAVENOUS
  Filled 2017-11-11: qty 20

## 2017-11-11 MED ORDER — MIDAZOLAM HCL 2 MG/2ML IJ SOLN
1.0000 mg | INTRAMUSCULAR | Status: DC | PRN
  Filled 2017-11-11: qty 2

## 2017-11-11 MED ORDER — FENTANYL CITRATE (PF) 100 MCG/2ML IJ SOLN
INTRAMUSCULAR | Status: AC
  Filled 2017-11-11: qty 2

## 2017-11-11 MED ORDER — FENTANYL CITRATE (PF) 100 MCG/2ML IJ SOLN
INTRAMUSCULAR | Status: AC | PRN
  Administered 2017-11-11: 100 ug via INTRAVENOUS

## 2017-11-11 MED ORDER — FENTANYL BOLUS VIA INFUSION
25.0000 ug | INTRAVENOUS | Status: DC | PRN
  Filled 2017-11-11: qty 25

## 2017-11-11 MED ORDER — NALOXONE HCL 0.4 MG/ML IJ SOLN
INTRAMUSCULAR | Status: AC
  Administered 2017-11-11: 14:00:00
  Filled 2017-11-11: qty 1

## 2017-11-11 MED ORDER — SODIUM CHLORIDE 0.9 % IV SOLN
1750.0000 mg | Freq: Once | INTRAVENOUS | Status: AC
  Administered 2017-11-11: 1750 mg via INTRAVENOUS
  Filled 2017-11-11: qty 1750

## 2017-11-11 MED ORDER — SODIUM CHLORIDE 0.9 % IV SOLN
0.0000 ug/min | INTRAVENOUS | Status: DC
  Administered 2017-11-11: 200 ug/min via INTRAVENOUS
  Administered 2017-11-11: 270 ug/min via INTRAVENOUS
  Administered 2017-11-11: 290 ug/min via INTRAVENOUS
  Filled 2017-11-11: qty 4
  Filled 2017-11-11: qty 40
  Filled 2017-11-11: qty 4

## 2017-11-11 NOTE — ED Triage Notes (Signed)
Pt BIB [facility] for eval of generalized weakness. Pt c/o R sided abd pain onset Monday and R leg weakness. Pt SHOB and tachypneic to the 30s on arrival. Hypotensive 84/42 on arrival.

## 2017-11-11 NOTE — ED Notes (Signed)
Critical Care Provider at bedside. 

## 2017-11-11 NOTE — Progress Notes (Signed)
PCCM INTERVAL PROGRESS NOTE  Art line placed. Confirms low BP. Will start phenylephrine infusion to keep MAP > 65 in addition to levo, vaso. If cannot maintain MAP with 3 agents, family understands there is not much else we can medically offer. They have decided to not go through with surgery. ABG pending. Suspect he is acidemic. Will order sodium bicarb infusion so it is ready to go if pH low.    Joneen RoachPaul Hoffman, AGACNP-BC [facility] Pager [phone] or [phone]  10-02-2017 1:47 PM

## 2017-11-11 NOTE — Progress Notes (Signed)
2nd RT attempted ABG without success as well. MD and RN aware.

## 2017-11-11 NOTE — ED Notes (Signed)
Consent signed for central line

## 2017-11-11 NOTE — ED Provider Notes (Addendum)
[facility] EMERGENCY DEPARTMENT Provider Note   CSN: [id number] Arrival date & time: 12-Nov-2017  4098     History   Chief Complaint Chief Complaint  Patient presents with  . Abdominal Pain    HPI [name] is a 72 y.o. male.  HPI  72 year old male with a history of CAD, ischemic cardiomyopathy and V. fib status post ICD presents with shortness of breath and abdominal pain.  Patient states his symptoms been ongoing for about 1 week.  He had a nonproductive cough, shortness of breath, and abdominal pain.  He states that the abdominal pain improves whenever he has a bowel movement but he has been constipated recently.  Last BM this morning.  He has not had any vomiting or diarrhea.  No fevers.  Shortness of breath has progressively worsened.  EMS found the patient to have O2 sats in the mid 80s and he was placed on nasal cannula oxygen.  When the oxygen was being transferred when he arrived here his sats dropped into the 70s.  He denies any chest pain.  He states he has not been using his Lasix this week because he is been 2 weeks to get up to get it.  Past Medical History:  Diagnosis Date  . CAD (coronary artery disease)    status post anterior MI 2008 w V. fib arrest // LHC 8/17: LM 30, pLAD stent ok, LCx ok, pRCA 10  . Carotid artery disease (HCC)    Carotid US 12/13: Bilateral ICA 0-39%  . Dyslipidemia   . History of acute anterior wall MI 2009   s/p stenting to LAD  . Hypertension   . Ischemic cardiomyopathy    a. Echo 9/17: Mild LVH, EF 25-30, ant-septal, inf-septal, apical AK, apical inf AK, severe ant HK, Gr 1 DD, mildly dilated aortic root and ascending aorta (asc Ao 38 mm, Ao root 39 mm), mild reduced RVSF  . Macular degeneration   . Other vitamin B12 deficiency anemia 08/14/2013  . Polyneuropathy in other diseases classified elsewhere (HCC) 02/12/2014  . Tremor, essential 02/03/2016  . Ventricular fibrillation [facility])    s/p ICD    Patient Active  Problem List   Diagnosis Date Noted  . Chest pain at rest 03/26/2016  . Tremor, essential 02/03/2016  . Polyneuropathy in other diseases classified elsewhere (HCC) 02/12/2014  . Other vitamin B12 deficiency anemia 08/14/2013  . Disturbance of skin sensation 05/22/2013  . Hypertension 07/21/2012  . Left carotid bruit 07/21/2012  . Right inguinal hernia 07/21/2012  . OTHER DYSPNEA AND RESPIRATORY ABNORMALITIES 07/15/2010  . AUTOMATIC IMPLANTABLE CARDIAC DEFIBRILLATOR SITU 09/03/2009  . HYPERLIPIDEMIA-MIXED 09/17/2008  . MYOCARDIAL INFARCTION, ANTERIOR WALL, INITIAL EPISODE 09/17/2008  . CAD, NATIVE VESSEL 09/17/2008  . CARDIOMYOPATHY, ISCHEMIC 09/17/2008  . VENTRICULAR FIBRILLATION 09/17/2008    Past Surgical History:  Procedure Laterality Date  . CARDIAC CATHETERIZATION  09/12/2007  . CARDIAC CATHETERIZATION N/A 03/26/2016   Procedure: Right/Left Heart Cath and Coronary Angiography;  Surgeon: Yvonne Kendall, MD;  Location: [facility] INVASIVE CV LAB;  Service: Cardiovascular;  Laterality: N/A;  . CATARACT EXTRACTION Bilateral   . ICD GENERATOR CHANGEOUT N/A 11/13/2016   Procedure: ICD Generator Changeout;  Surgeon: Marinus Maw, MD;  Location: [facility] INVASIVE CV LAB;  Service: Cardiovascular;  Laterality: N/A;  . ICD medtronic implanted  07/06/08   Dr. Sharrell Ku        Home Medications    Prior to Admission medications   Medication Sig Start Date End  Date Taking? Authorizing Provider  acetaminophen (TYLENOL) 500 MG tablet Take 500-1,000 mg by mouth every 6 (six) hours as needed for moderate pain or headache.    [provider]  aspirin (ASPIR-81) 81 MG EC tablet Take 81 mg by mouth daily.      [provider]  carvedilol (COREG) 25 MG tablet TAKE 0.5 TABLETS (12.5 MG TOTAL) BY MOUTH 2 (TWO) TIMES DAILY. 07/22/17   Tonny Bollmanooper, Michael, MD  digoxin (LANOXIN) 0.125 MG tablet TAKE 1 TABLET BY MOUTH DAILY. 07/21/17   Tonny Bollmanooper, Michael, MD  furosemide (LASIX) 40 MG tablet TAKE 1  TABLET (40 MG TOTAL) BY MOUTH DAILY. 09/22/16   Tonny Bollmanooper, Michael, MD  gabapentin (NEURONTIN) 400 MG capsule Take 2 capsules (800 mg total) by mouth 3 (three) times daily. 08/05/17   Levert FeinsteinYan, Yijun, MD  losartan (COZAAR) 25 MG tablet Take 25 mg by mouth daily. 09/05/17   [provider]  nitroGLYCERIN (NITROSTAT) 0.4 MG SL tablet Place 1 tablet (0.4 mg total) under the tongue every 5 (five) minutes as needed for chest pain (MAX 3 TABLETS). 03/04/17   Tonny Bollmanooper, Michael, MD  pantoprazole (PROTONIX) 40 MG tablet TAKE 1 TABLET (40 MG TOTAL) BY MOUTH DAILY. 03/23/17   Tonny Bollmanooper, Michael, MD  simvastatin (ZOCOR) 40 MG tablet TAKE 1 TABLET (40 MG TOTAL) BY MOUTH AT BEDTIME. 06/07/17   Tonny Bollmanooper, Michael, MD  vitamin B-12 (CYANOCOBALAMIN) 1000 MCG tablet Take 1,000 mcg by mouth daily.    [provider]    Family History Family History  Problem Relation Age of Onset  . Heart attack Father   . Cancer - Colon Sister   . Liver disease Brother   . Neuropathy Sister   . Neuropathy Sister     Social History Social History   Tobacco Use  . Smoking status: Former Games developermoker  . Smokeless tobacco: Never Used  . Tobacco comment: QUIT 09/2007  Substance Use Topics  . Alcohol use: Yes    Alcohol/week: 4.2 oz    Types: 7 Standard drinks or equivalent per week    Comment: beer  . Drug use: No     Allergies   Cymbalta [duloxetine hcl]   Review of Systems Review of Systems  Constitutional: Negative for fever.  HENT: Positive for congestion.   Respiratory: Positive for cough and shortness of breath.   Cardiovascular: Negative for chest pain and leg swelling (chronic, but unchanged).  Gastrointestinal: Positive for abdominal pain and constipation. Negative for abdominal distention, diarrhea, nausea and vomiting.  All other systems reviewed and are negative.    Physical Exam Updated Vital Signs BP (!) 80/53   Pulse 90   Temp 98.3 F (36.8 C)   Resp (!) 31   Ht 5\' 11"  (1.803 m)   Wt 95.3 kg (210  lb)   SpO2 96%   BMI 29.29 kg/m   Physical Exam  Constitutional: He is oriented to person, place, and time. He appears well-developed and well-nourished. He appears ill.  HENT:  Head: Normocephalic and atraumatic.  Right Ear: External ear normal.  Left Ear: External ear normal.  Nose: Nose normal.  Mouth/Throat: Mucous membranes are dry.  Eyes: Right eye exhibits no discharge. Left eye exhibits no discharge.  Neck: Neck supple. No JVD present.  Cardiovascular: Normal rate, regular rhythm and normal heart sounds.  Pulmonary/Chest: Effort normal. Tachypnea noted. He has rales.  Abdominal: Soft. There is tenderness (mild, generalized).  Musculoskeletal: He exhibits no edema (no pitting edema in extremities).  Neurological: He is  alert and oriented to person, place, and time.  Skin: Skin is warm and dry. He is not diaphoretic.  Nursing note and vitals reviewed.    ED Treatments / Results  Labs (all labs ordered are listed, but only abnormal results are displayed) Labs Reviewed  COMPREHENSIVE METABOLIC PANEL - Abnormal; Notable for the following components:      Result Value   CO2 17 (*)    BUN 36 (*)    Creatinine, Ser 4.60 (*)    Calcium 8.3 (*)    Albumin 2.5 (*)    Total Bilirubin 2.3 (*)    GFR calc non Af Amer 12 (*)    GFR calc Af Amer 14 (*)    Anion gap 17 (*)    All other components within normal limits  CBC WITH DIFFERENTIAL/PLATELET - Abnormal; Notable for the following components:   WBC 14.2 (*)    All other components within normal limits  I-STAT CG4 LACTIC ACID, ED - Abnormal; Notable for the following components:   Lactic Acid, Venous 6.40 (*)    All other components within normal limits  I-STAT CHEM 8, ED - Abnormal; Notable for the following components:   BUN 37 (*)    Creatinine, Ser 4.40 (*)    Calcium, Ion 0.98 (*)    TCO2 20 (*)    All other components within normal limits  CULTURE, BLOOD (ROUTINE X 2)  CULTURE, BLOOD (ROUTINE X 2)  URINALYSIS,  ROUTINE W REFLEX MICROSCOPIC  BRAIN NATRIURETIC PEPTIDE  INFLUENZA PANEL BY PCR (TYPE A & B)  DIGOXIN LEVEL  I-STAT TROPONIN, ED  I-STAT ARTERIAL BLOOD GAS, ED  I-STAT CG4 LACTIC ACID, ED    EKG EKG Interpretation  Date/Time:  Thursday November 11 2017 06:58:45 EDT Ventricular Rate:  98 PR Interval:    QRS Duration: 122 QT Interval:  339 QTC Calculation: 433 R Axis:   -55 Text Interpretation:  Sinus rhythm Left bundle branch block Baseline wander in lead(s) V2 V3 V5 V6 no significant change since April 2018 Confirmed by Pricilla Loveless [phone]) on 11/09/2017 7:09:04 AM   Radiology Dg Chest Port 1 View  Result Date: 11/10/2017 CLINICAL DATA:  Severe cough.  Possible pneumonia. EXAM: PORTABLE CHEST 1 VIEW COMPARISON:  04/13/2016. FINDINGS: Cardiac pacer with lead tip over the right ventricle. Cardiomegaly with normal pulmonary vascularity. Low lung volumes with mild basilar atelectasis. No pleural effusion or pneumothorax. IMPRESSION: Low lung volumes with mild basilar atelectasis. Mild bibasilar interstitial prominence. Mild pneumonitis may be present. Electronically Signed   By: Maisie Fus  Register   On: 11/22/2017 07:47    Procedures .Central Line Date/Time: 11/14/2017 8:48 AM Performed by: Pricilla Loveless, MD Authorized by: Pricilla Loveless, MD   Consent:    Consent obtained:  Verbal and written   Consent given by:  Patient   Risks discussed:  Arterial puncture, bleeding, infection, incorrect placement and nerve damage Pre-procedure details:    Hand hygiene: Hand hygiene performed prior to insertion     Sterile barrier technique: All elements of maximal sterile technique followed     Skin preparation:  ChloraPrep   Skin preparation agent: Skin preparation agent completely dried prior to procedure   Anesthesia (see MAR for exact dosages):    Anesthesia method:  Local infiltration   Local anesthetic:  Lidocaine 2% w/o epi Procedure details:    Location:  R femoral   Site  selection rationale:  Unable to lay in trendelenburg   Procedural supplies:  Triple lumen  Catheter size:  7 Fr   Landmarks identified: yes     Ultrasound guidance: yes     Sterile ultrasound techniques: Sterile gel and sterile probe covers were used     Number of attempts:  1   Successful placement: yes   Post-procedure details:    Post-procedure:  Dressing applied and line sutured   Assessment:  Blood return through all ports and free fluid flow   Patient tolerance of procedure:  Tolerated well, no immediate complications .Critical Care Performed by: Pricilla Loveless, MD Authorized by: Pricilla Loveless, MD   Critical care provider statement:    Critical care time (minutes):  45   Critical care time was exclusive of:  Separately billable procedures and treating other patients   Critical care was necessary to treat or prevent imminent or life-threatening deterioration of the following conditions:  Cardiac failure, sepsis, shock and respiratory failure   Critical care was time spent personally by me on the following activities:  Development of treatment plan with patient or surrogate, discussions with consultants, evaluation of patient's response to treatment, examination of patient, obtaining history from patient or surrogate, ordering and performing treatments and interventions, ordering and review of laboratory studies, ordering and review of radiographic studies, pulse oximetry, re-evaluation of patient's condition and review of old charts   (including critical care time)  Medications Ordered in ED Medications  cefTRIAXone (ROCEPHIN) 2 g in sodium chloride 0.9 % 100 mL IVPB (2 g Intravenous New Bag/Given 11-20-2017 0748)  azithromycin (ZITHROMAX) 500 mg in sodium chloride 0.9 % 250 mL IVPB (500 mg Intravenous New Bag/Given 11/20/2017 0749)  sodium chloride 0.9 % bolus 500 mL (has no administration in time range)  norepinephrine (LEVOPHED) 4 mg in dextrose 5 % 250 mL (0.016 mg/mL) infusion  (has no administration in time range)  sodium chloride 0.9 % bolus 500 mL (0 mLs Intravenous Stopped 2017/11/20 0748)  sodium chloride 0.9 % bolus 1,000 mL (1,000 mLs Intravenous New Bag/Given 11-20-2017 0748)     Initial Impression / Assessment and Plan / ED Course  I have reviewed the triage vital signs and the nursing notes.  Pertinent labs & imaging results that were available during my care of the patient were reviewed by me and considered in my medical decision making (see chart for details).     Patient presents with shock. With his history of CHF (30% or less), used Ideal Body Weight for fluids (IBW 166 pounds (75 kg)).  Despite this he is still hypotensive.  Femoral line placed as above.  He is still working to breathe but mentating well and oxygenating well.  He has abdominal pain but no vomiting and soft abdomen.  Upon doing the femoral line, he is noted to have a very large scrotum.  He states this is chronic and not worse than typical.  It is very firm but no significant tenderness.  Given the cough and congestion I am still mostly concerned about pneumonia.  ICU has been consulted and will admit.  I discussed with wife and we have discussed his critical condition.  Final Clinical Impressions(s) / ED Diagnoses   Final diagnoses:  Septic shock (HCC)  Acute kidney injury [facility])    ED Discharge Orders    None       Pricilla Loveless, MD Nov 20, 2017 1637    Pricilla Loveless, MD 20-Nov-2017 [phone]

## 2017-11-11 NOTE — Consult Note (Signed)
[name] September 21, 1945  [id number].    Requesting MD: Dr. Kara Mead Chief Complaint/Reason for Consult: Bilateral inguinal hernias  HPI:  This is a 72 year old white male with multiple medical problems including coronary artery disease status post stenting, ventricular fibrillation arrest in 2009 requiring a pacemaker/defibrillator, alcohol abuse, ischemic cardiomyopathy, hypertension, polyneuropathy, and chronic scrotal swelling.  The patient is currently intubated on a ventilator and unable to provide a history.  His wife provides the entirety of his history.  She states that he has had a "cystocele" for many years.  She says he has been trying to tell her that it was a hernia but she had a friend tell her that it was likely just a cystocele and he has never sought treatment.  Over the last year he has had abdominal pain on and off with intermittent constipation.  He does not go to the doctor to have this evaluated.  This past Tuesday the patient began having increasing abdominal pain.  He did subsequently develop nausea but no emesis.  He has had trouble moving his bowels but did have some liquid stool this morning with blood in the toilet.  Apparently the patient has been complaining of such severe abdominal pain over the last 2 days that he has been out unable to walk and this morning his wife was unable to even sit him up to take his pills.  She finally called EMS and it took over 30 minutes for the patient to be agreeable to be brought to the emergency department.  Upon arrival he was found to be in septic shock with hypotension.  He was immediately intubated.  He was then taken to the CT scanner where he had an abdominal scan that revealed large and small bowel in both of his inguinal hernias.  He also had fat present in these hernias as well.  We were consulted for further evaluation and recommendations.  ROS: ROS: Please see HPI.  Otherwise unable to obtain a full review of  systems as the patient is intubated on the ventilator.  Family History  Problem Relation Age of Onset  . Heart attack Father   . Cancer - Colon Sister   . Liver disease Brother   . Neuropathy Sister   . Neuropathy Sister     Past Medical History:  Diagnosis Date  . CAD (coronary artery disease)    status post anterior MI 2008 w V. fib arrest // LHC 8/17: LM 30, pLAD stent ok, LCx ok, pRCA 10  . Carotid artery disease ([place])    Carotid US 12/13: Bilateral ICA 0-39%  . Dyslipidemia   . History of acute anterior wall MI 2009   s/p stenting to LAD  . Hypertension   . Ischemic cardiomyopathy    a. Echo 9/17: Mild LVH, EF 25-30, ant-septal, inf-septal, apical AK, apical inf AK, severe ant HK, Gr 1 DD, mildly dilated aortic root and ascending aorta (asc Ao 38 mm, Ao root 39 mm), mild reduced RVSF  . Macular degeneration   . Other vitamin B12 deficiency anemia 08/14/2013  . Polyneuropathy in other diseases classified elsewhere ([place]) 02/12/2014  . Tremor, essential 02/03/2016  . Ventricular fibrillation [facility])    s/p ICD    Past Surgical History:  Procedure Laterality Date  . CARDIAC CATHETERIZATION  09/12/2007  . CARDIAC CATHETERIZATION N/A 03/26/2016   Procedure: Right/Left Heart Cath and Coronary Angiography;  Surgeon: Nelva Bush, MD;  Location: [facility];  Service:  Cardiovascular;  Laterality: N/A;  . CATARACT EXTRACTION Bilateral   . ICD GENERATOR CHANGEOUT N/A 11/13/2016   Procedure: ICD Generator Changeout;  Surgeon: Evans Lance, MD;  Location: [facility];  Service: Cardiovascular;  Laterality: N/A;  . ICD medtronic implanted  07/06/08   Dr. Crissie Sickles    Social History:  reports that he has quit smoking. He has never used smokeless tobacco. He reports that he drinks about 4.2 oz of alcohol per week. He reports that he does not use drugs.  Allergies:  Allergies  Allergen Reactions  . Cymbalta [Duloxetine Hcl] Other (See Comments)    Made patient feel out  of it    Medications Prior to Admission  Medication Sig Dispense Refill  . acetaminophen (TYLENOL) 500 MG tablet Take 500-1,000 mg by mouth every 6 (six) hours as needed for moderate pain or headache.    Marland Kitchen aspirin (ASPIR-81) 81 MG EC tablet Take 81 mg by mouth daily.      . carvedilol (COREG) 25 MG tablet TAKE 0.5 TABLETS (12.5 MG TOTAL) BY MOUTH 2 (TWO) TIMES DAILY. 30 tablet 7  . digoxin (LANOXIN) 0.125 MG tablet TAKE 1 TABLET BY MOUTH DAILY. 30 tablet 6  . furosemide (LASIX) 40 MG tablet TAKE 1 TABLET (40 MG TOTAL) BY MOUTH DAILY. 30 tablet 11  . gabapentin (NEURONTIN) 400 MG capsule Take 2 capsules (800 mg total) by mouth 3 (three) times daily. 180 capsule 1  . losartan (COZAAR) 25 MG tablet Take 25 mg by mouth daily.  8  . nitroGLYCERIN (NITROSTAT) 0.4 MG SL tablet Place 1 tablet (0.4 mg total) under the tongue every 5 (five) minutes as needed for chest pain (MAX 3 TABLETS). 25 tablet 3  . pantoprazole (PROTONIX) 40 MG tablet TAKE 1 TABLET (40 MG TOTAL) BY MOUTH DAILY. 30 tablet 11  . simvastatin (ZOCOR) 40 MG tablet TAKE 1 TABLET (40 MG TOTAL) BY MOUTH AT BEDTIME. 30 tablet 7  . vitamin B-12 (CYANOCOBALAMIN) 1000 MCG tablet Take 1,000 mcg by mouth daily.       Physical Exam: Blood pressure 110/65, pulse 96, temperature 100 F (37.8 C), temperature source Axillary, resp. rate (!) 25, height 5' 11"  (1.803 m), weight 95.3 kg (210 lb), SpO2 99 %. General: Ill-appearing white male who is laying in bed on the ventilator. HEENT: head is normocephalic, atraumatic.  Sclera are noninjected.  PERRL.  Ears and nose without any masses or lesions.  Mouth with ET tube in place Heart: regular, rate, and rhythm.  Normal s1,s2. No obvious murmurs, gallops, or rubs noted.  Palpable radial and pedal pulses bilaterally Lungs: Coarse breath sounds throughout with some rhonchi.  Patient is on the ventilator. Abd: Soft, but actively guards with palpation specifically on the right side of the abdomen.  The  patient has bilateral inguinal hernias with a scrotum that is basketball size.  These hernias are so large and painful but they are unable to be reduced.  Unable to visualize his penis secondary to significant size of his scrotum. MS: all 4 extremities are symmetrical with no cyanosis, clubbing, or edema. Skin: warm and dry with no masses, lesions, or rashes Psych: Unable to assess secondary to currently being on the ventilator.   Results for orders placed or performed during the hospital encounter of 11/07/2017 (from the past 48 hour(s))  Comprehensive metabolic panel     Status: Abnormal   Collection Time: 11/06/2017  7:07 AM  Result Value Ref Range   Sodium 139  135 - 145 mmol/L   Potassium 5.0 3.5 - 5.1 mmol/L   Chloride 105 101 - 111 mmol/L   CO2 17 (L) 22 - 32 mmol/L   Glucose, Bld 96 65 - 99 mg/dL   BUN 36 (H) 6 - 20 mg/dL   Creatinine, Ser 4.60 (H) 0.61 - 1.24 mg/dL   Calcium 8.3 (L) 8.9 - 10.3 mg/dL   Total Protein 6.5 6.5 - 8.1 g/dL   Albumin 2.5 (L) 3.5 - 5.0 g/dL   AST 32 15 - 41 U/L   ALT 19 17 - 63 U/L   Alkaline Phosphatase 50 38 - 126 U/L   Total Bilirubin 2.3 (H) 0.3 - 1.2 mg/dL   GFR calc non Af Amer 12 (L) >60 mL/min   GFR calc Af Amer 14 (L) >60 mL/min    Comment: (NOTE) The eGFR has been calculated using the CKD EPI equation. This calculation has not been validated in all clinical situations. eGFR's persistently <60 mL/min signify possible Chronic Kidney Disease.    Anion gap 17 (H) 5 - 15    Comment: Performed at [facility], [place] [address] [place], [place] [address]  CBC WITH DIFFERENTIAL     Status: Abnormal   Collection Time: 11/29/2017  7:07 AM  Result Value Ref Range   WBC 14.2 (H) 4.0 - 10.5 K/uL   RBC 4.66 4.22 - 5.81 MIL/uL   Hemoglobin 14.3 13.0 - 17.0 g/dL   HCT 44.9 39.0 - 52.0 %   MCV 96.4 78.0 - 100.0 fL   MCH 30.7 26.0 - 34.0 pg   MCHC 31.8 30.0 - 36.0 g/dL   RDW 13.9 11.5 - 15.5 %   Platelets 191 150 - 400 K/uL   Neutrophils Relative  % 85 %   Lymphocytes Relative 10 %   Monocytes Relative 5 %   Eosinophils Relative 0 %   Basophils Relative 0 %   Band Neutrophils 0 %   Metamyelocytes Relative 0 %   Myelocytes 0 %   Promyelocytes Relative 0 %   Blasts 0 %   nRBC 0 0 /100 WBC   Other 0 %   Neutro Abs 12.1 (H) 1.7 - 7.7 K/uL   Lymphs Abs 1.4 0.7 - 4.0 K/uL   Monocytes Absolute 0.7 0.1 - 1.0 K/uL   Eosinophils Absolute 0.0 0.0 - 0.7 K/uL   Basophils Absolute 0.0 0.0 - 0.1 K/uL   WBC Morphology INCREASED BANDS (>20% BANDS)     Comment: MILD LEFT SHIFT (1-5% METAS, OCC MYELO, OCC BANDS) Performed at [facility] [address], [place], [place] [address]   Brain natriuretic peptide     Status: Abnormal   Collection Time: 11/16/2017  7:07 AM  Result Value Ref Range   B Natriuretic Peptide 299.5 (H) 0.0 - 100.0 pg/mL    Comment: Performed at [facility] [address], [place], [place] [address]  I-stat troponin, ED (not at [facility])     Status: None   Collection Time: 11/19/2017  7:19 AM  Result Value Ref Range   Troponin i, poc 0.03 0.00 - 0.08 ng/mL   Comment 3            Comment: Due to the release kinetics of cTnI, a negative result within the first hours of the onset of symptoms does not rule out myocardial infarction with certainty. If myocardial infarction is still suspected, repeat the test at appropriate intervals.   I-Stat CG4 Lactic Acid,  ED  (not at  [facility])     Status: Abnormal   Collection Time: 11/05/2017  7:21 AM  Result Value Ref Range   Lactic Acid, Venous 6.40 (HH) 0.5 - 1.9 mmol/L   Comment NOTIFIED PHYSICIAN   I-stat chem 8, ed     Status: Abnormal   Collection Time: 11/01/2017  7:22 AM  Result Value Ref Range   Sodium 140 135 - 145 mmol/L   Potassium 4.9 3.5 - 5.1 mmol/L   Chloride 108 101 - 111 mmol/L   BUN 37 (H) 6 - 20 mg/dL   Creatinine, Ser 4.40 (H) 0.61 - 1.24 mg/dL   Glucose, Bld 97 65 - 99 mg/dL   Calcium, Ion 0.98 (L) 1.15 - 1.40 mmol/L   TCO2 20 (L) 22 - 32 mmol/L    Hemoglobin 14.3 13.0 - 17.0 g/dL   HCT 42.0 39.0 - 52.0 %  I-Stat CG4 Lactic Acid, ED  (not at  [facility])     Status: Abnormal   Collection Time: 11/02/2017  8:57 AM  Result Value Ref Range   Lactic Acid, Venous 4.95 (HH) 0.5 - 1.9 mmol/L   Comment NOTIFIED PHYSICIAN   Protime-INR     Status: Abnormal   Collection Time: 11/14/2017  9:17 AM  Result Value Ref Range   Prothrombin Time 16.8 (H) 11.4 - 15.2 seconds   INR 1.38     Comment: Performed at [facility], [address] [place], [place] [address]  Magnesium     Status: None   Collection Time: 11/02/2017  9:17 AM  Result Value Ref Range   Magnesium 1.8 1.7 - 2.4 mg/dL    Comment: Performed at [facility] [address] [place], [place] [address]  Phosphorus     Status: None   Collection Time: 11/20/2017  9:17 AM  Result Value Ref Range   Phosphorus 3.3 2.5 - 4.6 mg/dL    Comment: Performed at [facility] [address], [place], [place] [address]  Lactic acid, plasma     Status: Abnormal   Collection Time: 11/04/2017 10:15 AM  Result Value Ref Range   Lactic Acid, Venous 4.6 (HH) 0.5 - 1.9 mmol/L    Comment: CRITICAL RESULT CALLED TO, READ BACK BY AND VERIFIED WITH: D WHITE RN [id number] 11/20/2017 BY A BENNETT Performed at [facility], [place] [address] [place], [place] [address]   MRSA PCR Screening     Status: None   Collection Time: 11/28/2017 11:11 AM  Result Value Ref Range   MRSA by PCR NEGATIVE NEGATIVE    Comment:        The GeneXpert MRSA Assay (FDA approved for NASAL specimens only), is one component of a comprehensive MRSA colonization surveillance program. It is not intended to diagnose MRSA infection nor to guide or monitor treatment for MRSA infections. Performed at [facility], [place] [address], [place], [place] [address]   Glucose, capillary     Status: Abnormal   Collection Time: 11/15/2017 11:36 AM  Result Value Ref Range   Glucose-Capillary 163 (H) 65 - 99 mg/dL  Urinalysis,  Routine w reflex microscopic     Status: Abnormal   Collection Time: 11/10/2017 11:41 AM  Result Value Ref Range   Color, Urine AMBER (A) YELLOW    Comment: BIOCHEMICALS MAY BE AFFECTED BY COLOR   APPearance CLEAR CLEAR   Specific Gravity, Urine 1.023 1.005 - 1.030   pH 5.0 5.0 - 8.0  Glucose, UA NEGATIVE NEGATIVE mg/dL   Hgb urine dipstick NEGATIVE NEGATIVE   Bilirubin Urine SMALL (A) NEGATIVE   Ketones, ur NEGATIVE NEGATIVE mg/dL   Protein, ur 30 (A) NEGATIVE mg/dL   Nitrite NEGATIVE NEGATIVE   Leukocytes, UA TRACE (A) NEGATIVE   RBC / HPF 0-5 0 - 5 RBC/hpf   WBC, UA 6-30 0 - 5 WBC/hpf   Bacteria, UA RARE (A) NONE SEEN   Squamous Epithelial / LPF 0-5 (A) NONE SEEN   Mucus PRESENT    Hyaline Casts, UA PRESENT     Comment: Performed at [facility], [place] [address], [place], [place] [address]  Strep pneumoniae urinary antigen  (not at [facility])     Status: None   Collection Time: 11/07/2017 11:42 AM  Result Value Ref Range   Strep Pneumo Urinary Antigen NEGATIVE NEGATIVE    Comment:        Infection due to S. pneumoniae cannot be absolutely ruled out since the antigen present may be below the detection limit of the test. Performed at [facility], [place] [address], [place], [place] [address]   Digoxin level     Status: None   Collection Time: 11/25/2017 12:00 PM  Result Value Ref Range   Digoxin Level 1.1 0.8 - 2.0 ng/mL    Comment: Performed at [facility] [address], [place], [place] [address]  Influenza panel by PCR (type A & B)     Status: None   Collection Time: 11/19/2017 12:35 PM  Result Value Ref Range   Influenza A By PCR NEGATIVE NEGATIVE   Influenza B By PCR NEGATIVE NEGATIVE    Comment: (NOTE) The Xpert Xpress Flu assay is intended as an aid in the diagnosis of  influenza and should not be used as a sole basis for treatment.  This  assay is FDA approved for nasopharyngeal swab specimens only. Nasal  washings and aspirates are unacceptable for  Xpert Xpress Flu testing. Performed at [facility], [place] [address] [place], [place] [address]   Draw ABG 1 hour after initiation of ventilator     Status: Abnormal   Collection Time: 11/22/2017  1:30 PM  Result Value Ref Range   FIO2 50.00    Delivery systems VENTILATOR    Mode PRESSURE REGULATED VOLUME CONTROL    VT 600.0 mL   LHR 24.0 resp/min   Peep/cpap 5.0 cm H20   pH, Arterial 7.282 (L) 7.350 - 7.450   pCO2 arterial 46.0 32.0 - 48.0 mmHg   pO2, Arterial 97.7 83.0 - 108.0 mmHg   Bicarbonate 21.0 20.0 - 28.0 mmol/L   Acid-base deficit 4.6 (H) 0.0 - 2.0 mmol/L   O2 Saturation 96.5 %   Patient temperature 98.6    Collection site A-LINE    Drawn by [id number]    Sample type ARTERIAL DRAW    Ct Abdomen Pelvis Wo Contrast  Result Date: 11/10/2017 CLINICAL DATA:  Right upper quadrant pain, no fever, no elevation of white blood cell count EXAM: CT ABDOMEN AND PELVIS WITHOUT CONTRAST TECHNIQUE: Multidetector CT imaging of the abdomen and pelvis was performed following the standard protocol without IV contrast. COMPARISON:  None. FINDINGS: Lower chest: There is parenchymal opacity in both lung bases right greater than left some which could represent atelectasis. However pneumonia particularly in the right lower lobe is a definite consideration. The heart is mildly enlarged and pacer wires are present. Hepatobiliary: The liver is unremarkable in the unenhanced  state. No calcified gallstones are seen. Pancreas: The pancreas is normal in size and the pancreatic duct is not dilated. Spleen: The spleen is unremarkable. Adrenals/Urinary Tract: Adrenal glands appear normal. No hydronephrosis is seen. There appears to be some atrophy of the left kidney. There is also a large calculus within the left ureteropelvic junction measuring 16 x 10 x 16 mm. This may have been present for some time in view of the area of parenchymal loss in the mid lower left kidney. The more distal ureters are normal in  caliber. The urinary bladder is not well distended but no abnormality is seen. Stomach/Bowel: The stomach is moderately distended with fluid with no obvious abnormality. No definite small bowel obstruction is seen. The colon is largely decompressed and a portion of the colon extends into a left inguinal hernia without evidence of obstruction. Also, a portion of the right colon extends into a large right inguinal hernia containing both large and small bowel. Some edema of a loop of small bowel cannot be excluded within the right inguinal hernia. There is also some strandiness of the fat planes within the right inguinal hernia which may indicate fat necrosis or edema. The appendix appears normal in caliber within the right inguinal hernia and the terminal ileum is very difficult to assess. Vascular/Lymphatic: The abdominal aorta is normal in caliber with moderate abdominal aortic atherosclerosis noted. No adenopathy is seen. Reproductive: Prostate is normal in size for age. Other: Bilateral large inguinal hernias are present right much larger than left as described above. On the right both large and small bowel extends into the hernia and some edema of a loop of small bowel within the right hernia cannot be excluded. Musculoskeletal: There is very mild retrolisthesis of L5 on S1 and L4 on L5 most likely due to degenerative change of the facet joints at those levels. There is degenerative disc disease most marked at L4-5 and L5-S1. The SI joints appear corticated IMPRESSION: 1. Large right inguinal hernia extending into the scrotum containing both large and small bowel. There may be some edema of the loop of small bowel within the hernia with some adjacent strandiness either representing edema or fat necrosis. 2. There is a smaller left inguinal hernia extending into the scrotum containing a loop of the rectosigmoid colon without obstruction. 3. Opacity at both lung bases right greater than left. Possible atelectasis  but cannot exclude pneumonia. 4. Large calculus within the left ureteral-pelvic junction of 16 x 10 x 16 mm. There may be some atrophy of a portion of the left kidney and this may be a longstanding process. No definite obstruction is seen currently. No other renal or ureteral calculi are noted. 5. Moderate abdominal aortic atherosclerosis. Electronically Signed   By: Ivar Drape M.D.   On: 11/02/2017 11:15   Portable Chest X-ray  Result Date: 11/05/2017 CLINICAL DATA:  Endotracheal tube placement. EXAM: PORTABLE CHEST 1 VIEW COMPARISON:  11/09/2017, earlier the same day FINDINGS: 0931 hours. Endotracheal tube is new in the interval with tip position approximately 4.5 cm above the base of the carina. NG tube passes into the stomach although the distal tip is not included on the film. Pleuroparenchymal opacity in the lateral aspect of the left lung apex is similar to the study from 04/13/2016. Interval stability suggests scarring. The lungs are otherwise clear without focal pneumonia, edema, pneumothorax or pleural effusion. Interstitial markings are diffusely coarsened with chronic features. Cardiopericardial silhouette is at upper limits of normal for size. Left-sided pacer/AICD  again noted. Apparent pericardial calcification over the right heart. IMPRESSION: 1. Endotracheal tube tip 4.5 cm above the carina with NG tube passing into the stomach but distal tip not included on the film. 2. Underlying chronic interstitial coarsening. 3. Right pericardial calcification, as before. Electronically Signed   By: Misty Stanley M.D.   On: 11/08/2017 10:01   Dg Chest Port 1 View  Result Date: 11/12/2017 CLINICAL DATA:  Severe cough.  Possible pneumonia. EXAM: PORTABLE CHEST 1 VIEW COMPARISON:  04/13/2016. FINDINGS: Cardiac pacer with lead tip over the right ventricle. Cardiomegaly with normal pulmonary vascularity. Low lung volumes with mild basilar atelectasis. No pleural effusion or pneumothorax. IMPRESSION: Low lung  volumes with mild basilar atelectasis. Mild bibasilar interstitial prominence. Mild pneumonitis may be present. Electronically Signed   By: Marcello Moores  Register   On: 11/30/2017 07:47      Assessment/Plan Septic shock secondary to abdominal source, likely incarcerated bilateral inguinal hernias Unfortunately, this is a 72 year old male with multiple comorbidities who is presenting in septic shock likely secondary to incarcerated bilateral inguinal hernias that are very large in nature.  A thorough discussion has been had with the patient's wife as well as with Georgann Housekeeper, NP, with critical care medicine discussing likely outcomes and possible complications.  The patient already is a DNR.  Because of how sick the patient currently is as well as his declined baseline state, the wife has decided to forego an operation as she does not feel that this is what the patient would want as it may likely result in a long-term hospitalization with possible facility placement at discharge.  The wife has decided that she would like to pursue medical management at this point.  If he continues to decline with medical management, then she would like to pursue comfort care.  We feel this decision is very appropriate as the patient is a very high risk surgical candidate for multiple reason including cardiac tolerance, etc.  The wife is appreciative of our time.  Given her decision to not pursue surgical intervention, we will not follow at this time.  If something changes, please recontact the surgical team.   Henreitta Cea, [facility] Surgery 11/03/2017, 2:55 PM Pager: [phone]

## 2017-11-11 NOTE — Procedures (Signed)
Arterial Catheter Insertion Procedure Note Boneta LucksHarvey R Largo [id number] [birth year]  Procedure: Insertion of Arterial Catheter  Indications: Blood pressure monitoring  Procedure Details Consent: Risks of procedure as well as the alternatives and risks of each were explained to the (patient/caregiver).  Consent for procedure obtained. Time Out: Verified patient identification, verified procedure, site/side was marked, verified correct patient position, special equipment/implants available, medications/allergies/relevent history reviewed, required imaging and test results available.  Performed  Maximum sterile technique was used including antiseptics, cap, gloves, gown, hand hygiene, mask and sheet. Skin prep: Chlorhexidine; local anesthetic administered 20 gauge catheter was inserted into left femoral artery (placed more distally than a standard femoral line due to enlarged scrotum) using the Seldinger technique. ULTRASOUND GUIDANCE USED: YES Evaluation Blood flow good; BP tracing good. Complications: No apparent complications.   Joneen RoachPaul Jaeceon Michelin, AGACNP-BC [facility] Pager [phone] or [phone]  11/25/17 1:41 PM

## 2017-11-11 NOTE — Progress Notes (Signed)
Pharmacy Antibiotic Note  [name] is a 72 y.o. male admitted on 11/09/2017 with pneumonia.  Pharmacy has been consulted for vancomycin and Zosyn dosing.  Patient received 1 time doses of ceftriaxone and azithromycin in the ED. Has been intubated. WBC elevated, afebrile. SCr elevated at 4.6, appears baseline is ~1.2-1.4 from lab values on file from 2018.  Plan: Vancomycin 1750mg  IV x1 as load, then 1g IV q48h. Goal trough 15-5120mcg/mL. Zosyn 2.25g IV q8h Follow c/s, clinical progression, renal function/trend/UOP, LOT  Height: 5\' 11"  (180.3 cm) Weight: 210 lb (95.3 kg) IBW/kg (Calculated) : 75.3  Temp (24hrs), Avg:98.3 F (36.8 C), Min:98.3 F (36.8 C), Max:98.3 F (36.8 C)  Recent Labs  Lab 11/19/2017 0707 11/01/2017 0721 11/09/2017 0722 11/18/2017 0857  WBC 14.2*  --   --   --   CREATININE 4.60*  --  4.40*  --   LATICACIDVEN  --  6.40*  --  4.95*    Estimated Creatinine Clearance: 18.4 mL/min (A) (by C-G formula based on SCr of 4.4 mg/dL (H)).    Allergies  Allergen Reactions  . Cymbalta [Duloxetine Hcl] Other (See Comments)    Made patient feel out of it    Antimicrobials this admission: Vancomycin 4/11 >>  Zosyn 4/11 >>  Azithromycin 4/11 x1 Ceftriaxone 4/11 x1  Dose adjustments this admission: n/a  Microbiology results: 4/11 BCx:  4/11 UCx:   4/11 Sputum:   4/11 Resp panel:  Thank you for allowing pharmacy to be a part of this patient's care.  Brown Dunlap D. Doyl Bitting, PharmD, BCPS Clinical Pharmacist Clinical Phone for 11/06/2017 until 3:30pm: [phone] If after 3:30pm, please call main pharmacy at [phone] 11/29/2017 10:10 AM

## 2017-11-11 NOTE — Progress Notes (Signed)
PCCM INTERVAL PROGRESS NOTE  Long discussion with the patients wife and our surgical colleagues. Unclear source of sepsis unclear. Potentially from damaged bowel in the setting of large bilateral inguinal hernias. Surgery does offer exploratory surgery however given his degree of acute illness and multiple medical co-morbidities, he is considered high risk for not surviving the surgery, or not surviving the hospitalization with good quality of life. He would never want to be in a nursing home. They have elected to decline surgical intervention and attempt medical management. Should he decline they will transition to comfort care.     Joneen RoachPaul Hoffman, AGACNP-BC [facility]/Critical Care Pager [phone] or [phone]  May 04, 2018 12:33 PM

## 2017-11-11 NOTE — Procedures (Signed)
Intubation Procedure Note [name] [id number] Jun 22, 1946  Procedure: Intubation Indications: Respiratory insufficiency  Procedure Details Consent: Risks of procedure as well as the alternatives and risks of each were explained to the (patient/caregiver).  Consent for procedure obtained. Time Out: Verified patient identification, verified procedure, site/side was marked, verified correct patient position, special equipment/implants available, medications/allergies/relevent history reviewed, required imaging and test results available.  Performed  Maximum sterile technique was used including gloves, gown, hand hygiene and mask.  MAC and 4  Versed 2 fent 100 Etomidate 20  Evaluation Hemodynamic Status: BP stable throughout; O2 sats: stable throughout Patient's Current Condition: stable Complications: No apparent complications Patient did tolerate procedure well. Chest X-ray ordered to verify placement.  CXR: tube position acceptable.   Leanna Sato Elsworth Soho 11/18/2017

## 2017-11-11 NOTE — H&P (Signed)
PULMONARY / CRITICAL CARE MEDICINE   Name: [name] MRN: [id number] DOB: 14-Jun-1946    ADMISSION DATE:  2017/12/01 CONSULTATION DATE:  4/11  REFERRING MD:  Dr. Criss Alvine  CHIEF COMPLAINT:  SOB  HISTORY OF PRESENT ILLNESS:   72 year old male with past medical history as below, which is significant for coronary artery disease, ischemic cardiomyopathy with left ventricular ejection fraction 35%, ventricular fibrillation status post ICD implantation, and macular degeneration.  He presented to [facility] emergency department on 4/11 with complaints of shortness of breath and right-sided abdominal pain which have been progressive over about a 1 week time.  Also complained of non-productive.  Abdominal pain is described as improving with bowel movement. Tremors have worsened as well. Denies nausea, vomiting, diarrhea.  Wife reports yellow and bloody stool. Upon arrival to the emergency department oxygen saturations were noted to be in the 70s on room air, improved with Kermit.  Notably he has been unable to take his Lasix for the last 2 weeks.  Chest x-ray did not describe any acute process, however, he is experiencing orthopnea in ED. He was also hypotensive in ED, which did not respond to volume. Volume in general was gentle (2L) given his cardiac history and him being apparently volume overloaded on exam. He was started on pressors and PCCM was asked to admit to ICU.   PAST MEDICAL HISTORY :  He  has a past medical history of CAD (coronary artery disease), Carotid artery disease (HCC), Dyslipidemia, History of acute anterior wall MI (2009), Hypertension, Ischemic cardiomyopathy, Macular degeneration, Other vitamin B12 deficiency anemia (08/14/2013), Polyneuropathy in other diseases classified elsewhere (HCC) (02/12/2014), Tremor, essential (02/03/2016), and Ventricular fibrillation (HCC).  PAST SURGICAL HISTORY: He  has a past surgical history that includes ICD medtronic implanted (07/06/08); Cardiac  catheterization (09/12/2007); Cataract extraction (Bilateral); Cardiac catheterization (N/A, 03/26/2016); and ICD GENERATOR CHANGEOUT (N/A, 11/13/2016).  Allergies  Allergen Reactions  . Cymbalta [Duloxetine Hcl] Other (See Comments)    Made patient feel out of it    No current facility-administered medications on file prior to encounter.    Current Outpatient Medications on File Prior to Encounter  Medication Sig  . acetaminophen (TYLENOL) 500 MG tablet Take 500-1,000 mg by mouth every 6 (six) hours as needed for moderate pain or headache.  Marland Kitchen aspirin (ASPIR-81) 81 MG EC tablet Take 81 mg by mouth daily.    . carvedilol (COREG) 25 MG tablet TAKE 0.5 TABLETS (12.5 MG TOTAL) BY MOUTH 2 (TWO) TIMES DAILY.  . digoxin (LANOXIN) 0.125 MG tablet TAKE 1 TABLET BY MOUTH DAILY.  . furosemide (LASIX) 40 MG tablet TAKE 1 TABLET (40 MG TOTAL) BY MOUTH DAILY.  Marland Kitchen gabapentin (NEURONTIN) 400 MG capsule Take 2 capsules (800 mg total) by mouth 3 (three) times daily.  Marland Kitchen losartan (COZAAR) 25 MG tablet Take 25 mg by mouth daily.  . nitroGLYCERIN (NITROSTAT) 0.4 MG SL tablet Place 1 tablet (0.4 mg total) under the tongue every 5 (five) minutes as needed for chest pain (MAX 3 TABLETS).  . pantoprazole (PROTONIX) 40 MG tablet TAKE 1 TABLET (40 MG TOTAL) BY MOUTH DAILY.  . simvastatin (ZOCOR) 40 MG tablet TAKE 1 TABLET (40 MG TOTAL) BY MOUTH AT BEDTIME.  . vitamin B-12 (CYANOCOBALAMIN) 1000 MCG tablet Take 1,000 mcg by mouth daily.    FAMILY HISTORY:  His indicated that his mother is deceased. He indicated that his father is deceased. He indicated that four of his five sisters are alive. He indicated that his  brother is deceased.   SOCIAL HISTORY: He  reports that he has quit smoking. He has never used smokeless tobacco. He reports that he drinks about 4.2 oz of alcohol per week. He reports that he does not use drugs.  REVIEW OF SYSTEMS:   Bolds are positive  Constitutional: weight loss, gain, night sweats,  Fevers, chills, fatigue .  HEENT: headaches, Sore throat, sneezing, nasal congestion, post nasal drip, Difficulty swallowing, Tooth/dental problems, visual complaints visual changes, ear ache CV:  chest pain, radiates:,Orthopnea, PND, swelling in lower extremities, dizziness, palpitations, syncope.  GI  heartburn, indigestion, abdominal pain, nausea, vomiting, diarrhea, change in bowel habits, loss of appetite Resp: cough, nonproductive:, hemoptysis, dyspnea, chest pain, pleuritic.  Skin: rash or itching or icterus GU: dysuria, change in color of urine, urgency or frequency. flank pain, hematuria  MS: joint pain or swelling. decreased range of motion  Psych: change in mood or affect. depression or anxiety.  Neuro: difficulty with speech, weakness, numbness, ataxia    SUBJECTIVE:    VITAL SIGNS: BP (!) 80/53   Pulse 90   Temp 98.3 F (36.8 C)   Resp (!) 31   Ht 5\' 11"  (1.803 m)   Wt 95.3 kg (210 lb)   SpO2 96%   BMI 29.29 kg/m   HEMODYNAMICS:    VENTILATOR SETTINGS:    INTAKE / OUTPUT: I/O last 3 completed shifts: In: 50 [I.V.:50] Out: -   PHYSICAL EXAMINATION: General:  Obese male in moderate distress Neuro:  Alert, oriented, non-focal HEENT:  /AT, PERRL, no appreciable JVD Cardiovascular:  RRR, no MRG Lungs:  Coarse rhonchi diffusely Abdomen:  Soft, tender exquisitely on the R, mildly distended. Significant scrotal distension. Musculoskeletal:  No acute deformity or ROM limitation Skin:  Grossly intact.   LABS:  BMET Recent Labs  Lab 2017/08/31 0707 2017/08/31 0722  NA 139 140  K 5.0 4.9  CL 105 108  CO2 17*  --   BUN 36* 37*  CREATININE 4.60* 4.40*  GLUCOSE 96 97    Electrolytes Recent Labs  Lab 2017/08/31 0707  CALCIUM 8.3*    CBC Recent Labs  Lab 2017/08/31 0707 2017/08/31 0722  WBC 14.2*  --   HGB 14.3 14.3  HCT 44.9 42.0  PLT 191  --     Coag's No results for input(s): APTT, INR in the last 168 hours.  Sepsis Markers Recent Labs   Lab 2017/08/31 0721  LATICACIDVEN 6.40*    ABG No results for input(s): PHART, PCO2ART, PO2ART in the last 168 hours.  Liver Enzymes Recent Labs  Lab 2017/08/31 0707  AST 32  ALT 19  ALKPHOS 50  BILITOT 2.3*  ALBUMIN 2.5*    Cardiac Enzymes No results for input(s): TROPONINI, PROBNP in the last 168 hours.  Glucose No results for input(s): GLUCAP in the last 168 hours.  Imaging Dg Chest Port 1 View  Result Date: 09/15/2017 CLINICAL DATA:  Severe cough.  Possible pneumonia. EXAM: PORTABLE CHEST 1 VIEW COMPARISON:  04/13/2016. FINDINGS: Cardiac pacer with lead tip over the right ventricle. Cardiomegaly with normal pulmonary vascularity. Low lung volumes with mild basilar atelectasis. No pleural effusion or pneumothorax. IMPRESSION: Low lung volumes with mild basilar atelectasis. Mild bibasilar interstitial prominence. Mild pneumonitis may be present. Electronically Signed   By: Maisie Fushomas  Register   On: 002/13/2019 07:47   STUDIES:  CT abd pelvis 4/11  Echo 4/11 >  CULTURES: Blood 4/11 >  Urine 4/11 > Sputum 4/11 > RVP 4/11 >  ANTIBIOTICS: Azithromycin  4/11 > CTX 4/11 >  SIGNIFICANT EVENTS: 4/11 admit with septic shock  LINES/TUBES: ETT 4/11 > Femoral CVL 4/11 > Foley 4/11 >  DISCUSSION: 72 year old male with cardiac history presented 4/11 with abdominal pain and SOB. Hypotensive and hypoxic in ED requiring pressors and intubation. He was admitted to ICU. CT abd pending. Working dx septic shock with pulmonary vs intraabdominal source.   ASSESSMENT / PLAN:  PULMONARY A: Acute hypoxemic respiratory failure ? PNA vs pulm edema  P:   Full vent support ABX as above Unable to diurese in setting shock Follow CXR Obtain ABG  CARDIOVASCULAR A:  Shock:  Likely septic although no identifiable source.  CAD with ischemic cardiomyopathy Chronic systolic CHF LVEF 35%  P:  Telemetry S/p 2 L IVF On high doses levophed MAP goal  65 mm/Hg Trend  lactic Echo  RENAL A:   AKI Metabolic acidosis  P:   Follow BMP  GASTROINTESTINAL A:   Abdominal pain: wife reports yellow stool and constipation.   P:   CT abd pelvis NPO Protonix Surgery to see pending CT result.  HEMATOLOGIC A:   No acute issues  P:  Follow CBC Check Coags  INFECTIOUS A:   Suspecting sepsis. Pulmonary vs intraabdominal.   P:   Broaden antibiotics Check PCT Pan culture and RVP   ENDOCRINE A:   No acute issues    P:   Follow glucose on BMP  NEUROLOGIC A:   Acute metabolic encephalopathy   P:   RASS goal: -1 to -2 Fentanyl infusion Versed PRN  WUA daily   FAMILY  - Updates: Wife updated at length. Discussed intubation which she understood risks and is ok to proceed.   - Inter-disciplinary family meet or Palliative Care meeting due by:  4/18   Joneen Roach, AGACNP-BC [place] [facility] Pulmonology/Critical Care Pager [phone] or [phone]  11/05/2017 10:12 AM

## 2017-11-11 NOTE — ED Notes (Signed)
Critical Care Provider spoke with patient and patient's wife over the phone about intubation

## 2017-11-11 NOTE — Progress Notes (Signed)
RT attempted x 2 RR for ABG without success. RT will have another RT attempt.

## 2017-11-12 ENCOUNTER — Inpatient Hospital Stay (HOSPITAL_COMMUNITY): Payer: Medicare HMO

## 2017-11-12 ENCOUNTER — Other Ambulatory Visit: Payer: Self-pay

## 2017-11-12 DIAGNOSIS — N179 Acute kidney failure, unspecified: Secondary | ICD-10-CM

## 2017-11-12 DIAGNOSIS — I5030 Unspecified diastolic (congestive) heart failure: Secondary | ICD-10-CM

## 2017-11-12 LAB — BASIC METABOLIC PANEL
Anion gap: 18 — ABNORMAL HIGH (ref 5–15)
BUN: 50 mg/dL — AB (ref 6–20)
CO2: 21 mmol/L — ABNORMAL LOW (ref 22–32)
CREATININE: 5.13 mg/dL — AB (ref 0.61–1.24)
Calcium: 6.2 mg/dL — CL (ref 8.9–10.3)
Chloride: 101 mmol/L (ref 101–111)
GFR calc Af Amer: 12 mL/min — ABNORMAL LOW (ref >60)
GFR, EST NON AFRICAN AMERICAN: 10 mL/min — AB (ref >60)
Glucose, Bld: 117 mg/dL — ABNORMAL HIGH (ref 65–99)
POTASSIUM: 4.8 mmol/L (ref 3.5–5.1)
SODIUM: 140 mmol/L (ref 135–145)

## 2017-11-12 LAB — BLOOD GAS, ARTERIAL
Acid-base deficit: 0 mmol/L (ref 0.0–2.0)
Bicarbonate: 24.3 mmol/L (ref 20.0–28.0)
DRAWN BY: 414221
FIO2: 50
O2 SAT: 93.2 %
PATIENT TEMPERATURE: 99
PCO2 ART: 41.6 mmHg (ref 32.0–48.0)
PEEP: 5 cmH2O
PO2 ART: 70.4 mmHg — AB (ref 83.0–108.0)
RATE: 24 resp/min
VT: 600 mL
pH, Arterial: 7.386 (ref 7.350–7.450)

## 2017-11-12 LAB — CBC
HEMATOCRIT: 39.5 % (ref 39.0–52.0)
Hemoglobin: 12.7 g/dL — ABNORMAL LOW (ref 13.0–17.0)
MCH: 31 pg (ref 26.0–34.0)
MCHC: 32.2 g/dL (ref 30.0–36.0)
MCV: 96.3 fL (ref 78.0–100.0)
Platelets: 188 10*3/uL (ref 150–400)
RBC: 4.1 MIL/uL — ABNORMAL LOW (ref 4.22–5.81)
RDW: 14.4 % (ref 11.5–15.5)
WBC: 15.9 10*3/uL — ABNORMAL HIGH (ref 4.0–10.5)

## 2017-11-12 LAB — URINE CULTURE: CULTURE: NO GROWTH

## 2017-11-12 LAB — ECHOCARDIOGRAM COMPLETE
HEIGHTINCHES: 71 in
WEIGHTICAEL: 3629.65 [oz_av]

## 2017-11-12 LAB — MAGNESIUM: MAGNESIUM: 1.5 mg/dL — AB (ref 1.7–2.4)

## 2017-11-12 LAB — PHOSPHORUS: PHOSPHORUS: 5.1 mg/dL — AB (ref 2.5–4.6)

## 2017-11-12 LAB — GLUCOSE, CAPILLARY
Glucose-Capillary: 111 mg/dL — ABNORMAL HIGH (ref 65–99)
Glucose-Capillary: 104 mg/dL — ABNORMAL HIGH (ref 65–99)

## 2017-11-12 LAB — LEGIONELLA PNEUMOPHILA SEROGP 1 UR AG: L. PNEUMOPHILA SEROGP 1 UR AG: NEGATIVE

## 2017-11-12 MED ORDER — MAGNESIUM SULFATE 2 GM/50ML IV SOLN
2.0000 g | Freq: Once | INTRAVENOUS | Status: AC
  Administered 2017-11-12: 2 g via INTRAVENOUS
  Filled 2017-11-12: qty 50

## 2017-11-12 MED ORDER — SODIUM CHLORIDE 0.9 % IV SOLN
0.0000 ug/min | INTRAVENOUS | Status: DC
  Administered 2017-11-12 (×2): 320 ug/min via INTRAVENOUS
  Filled 2017-11-12 (×2): qty 8

## 2017-11-12 MED ORDER — ORAL CARE MOUTH RINSE
15.0000 mL | Freq: Two times a day (BID) | OROMUCOSAL | Status: DC
  Administered 2017-11-12: 15 mL via OROMUCOSAL

## 2017-11-12 MED ORDER — MORPHINE SULFATE (PF) 4 MG/ML IV SOLN
2.0000 mg | INTRAVENOUS | Status: DC | PRN
  Administered 2017-11-13: 2 mg via INTRAVENOUS
  Filled 2017-11-12: qty 1

## 2017-11-12 MED ORDER — GLYCOPYRROLATE 0.2 MG/ML IJ SOLN
0.1000 mg | INTRAMUSCULAR | Status: DC | PRN
  Administered 2017-11-12: 0.1 mg via SUBCUTANEOUS
  Filled 2017-11-12: qty 1

## 2017-11-12 MED ORDER — PERFLUTREN LIPID MICROSPHERE
1.0000 mL | INTRAVENOUS | Status: AC | PRN
  Administered 2017-11-12: 5 mL via INTRAVENOUS
  Filled 2017-11-12: qty 10

## 2017-11-12 MED ORDER — SODIUM CHLORIDE 0.9 % IV SOLN
1.0000 g | Freq: Once | INTRAVENOUS | Status: AC
  Administered 2017-11-12: 1 g via INTRAVENOUS
  Filled 2017-11-12: qty 10

## 2017-11-12 NOTE — Progress Notes (Signed)
Pharmacy Antibiotic Note  [name] is a 72 y.o. male admitted on 01/18/2018 with pneumonia and concern for abdominal infection. Pharmacy has been consulted for Vancomycin and Zosyn dosing.  The patient is in AKI with worsening SCr - up to 5.13, minimal UOP charted, estimated CrCl<10 ml/min. The patient received a Vancomycin loading dose on 4/11 - will check a 48h random level prior to re-dosing. Will d/c the standing order for now.   Plan: 1. Hold Vancomycin 2. Obtain a Vancomycin random order on 4/13 @ 1200 3. Continue Zosyn 2.25g IV every 8 hours 4. Will continue to follow renal function, culture results, LOT, and antibiotic de-escalation plans   Height: 5\' 11"  (180.3 cm) Weight: 226 lb 13.7 oz (102.9 kg) IBW/kg (Calculated) : 75.3  Temp (24hrs), Avg:99.7 F (37.6 C), Min:97.4 F (36.3 C), Max:102.2 F (39 C)  Recent Labs  Lab August 24, 2017 0707 August 24, 2017 0721 August 24, 2017 0722 August 24, 2017 0857 August 24, 2017 1015 August 24, 2017 1518 11/12/17 0412  WBC 14.2*  --   --   --   --   --  15.9*  CREATININE 4.60*  --  4.40*  --   --   --  5.13*  LATICACIDVEN  --  6.40*  --  4.95* 4.6* 4.5*  --     Estimated Creatinine Clearance: 16.4 mL/min (A) (by C-G formula based on SCr of 5.13 mg/dL (H)).    Allergies  Allergen Reactions  . Cymbalta [Duloxetine Hcl] Other (See Comments)    Made patient feel out of it    Antimicrobials this admission: Vancomycin 4/11 >>  Zosyn 4/11 >>  Azithromycin 4/11 x1 Ceftriaxone 4/11 x1  Dose adjustments this admission: n/a  Microbiology results: 4/11RCx >> pending 4/11 RVP >> neg 4/11 RCx (TA) >> pending 4/11BCx >> 4/11UCx >>  Thank you for allowing pharmacy to be a part of this patient's care.  Georgina PillionElizabeth Lavere Shinsky, PharmD, BCPS Clinical Pharmacist Pager: [phone] Clinical phone for 11/12/2017 from 7a-3:30p: [phone] If after 3:30p, please call main pharmacy at: [phone] 11/12/2017 9:41 AM

## 2017-11-12 NOTE — Care Management Note (Addendum)
Case Management Note  Patient Details  Name: Boneta LucksHarvey R Riano MRN: [id number] Date of Birth: [birth year]  Subjective/Objective:       History of acute anterior wall MI, HTN, Ischemic cardiomyopathy, Polyneuropathy, tremor,ICD, and Ventricular fibrillation.  Admitted with pneumonia; sepsis unclear, potentially from damaged bowel in the setting of large bilateral inguinal hernias  Action/Plan: Regarding Herina-family has decided to go through with surgery.  NCM will continue to monitor for any discharge transition needs.  Expected Discharge Date:     To Be Determined             Expected Discharge Plan:   To Be Determined  In-House Referral:     Discharge planning Services  CM Consult   Status of Service:  In process, will continue to follow  Yancey FlemingsKimberly R Becton, RN  Nurse case manager [place] 11/12/2017, 10:02 AM

## 2017-11-12 NOTE — Progress Notes (Signed)
PULMONARY / CRITICAL CARE MEDICINE   Name: [name] MRN: [id number] DOB: 07-22-1946    ADMISSION DATE:  11/20/2017 CONSULTATION DATE:  4/11  REFERRING MD:  Dr. Criss AlvineGoldston  CHIEF COMPLAINT:  SOB  HISTORY OF PRESENT ILLNESS:     72 year old male with past medical history as below, which is significant for coronary artery disease, ischemic cardiomyopathy with left ventricular ejection fraction 35%, ventricular fibrillation status post ICD implantation, and macular degeneration.  He presented to [facility] emergency department on 4/11 with complaints of shortness of breath and right-sided abdominal pain which have been progressive over about a 1 week time.  Also complained of non-productive.  Abdominal pain is described as improving with bowel movement. Tremors have worsened as well. Denies nausea, vomiting, diarrhea.  Wife reports yellow and bloody stool. Upon arrival to the emergency department oxygen saturations were noted to be in the 70s on room air, improved with [place].  Notably he has been unable to take his Lasix for the last 2 weeks.  Chest x-ray did not describe any acute process, however, he is experiencing orthopnea in ED. He was also hypotensive in ED, which did not respond to volume. Volume in general was gentle (2L) given his cardiac history and him being apparently volume overloaded on exam. He was started on pressors and PCCM was asked to admit to ICU.    SUBJECTIVE:  Awake, wants tube out. High Ve on PSV But insistent.   VITAL SIGNS: Blood Pressure (Abnormal) 126/55 (BP Location: Left Arm)   Pulse 90   Temperature 99.1 F (37.3 C) (Oral)   Respiration (Abnormal) 28   Height 5\' 11"  (1.803 m)   Weight 226 lb 13.7 oz (102.9 kg)   Oxygen Saturation 94%   Body Mass Index 31.64 kg/m   HEMODYNAMICS:    VENTILATOR SETTINGS: Vent Mode: PRVC FiO2 (%):  [40 %-50 %] 40 % Set Rate:  [15 bmp-24 bmp] 24 bmp Vt Set:  [600 mL] 600 mL PEEP:  [5 cmH20] 5 cmH20 Plateau Pressure:   [10 cmH20-25 cmH20] 25 cmH20  INTAKE / OUTPUT:  Intake/Output Summary (Last 24 hours) at 11/12/2017 1024 Last data filed at 11/12/2017 1001 Gross per 24 hour  Intake 9278.98 ml  Output 272 ml  Net 9006.98 ml     PHYSICAL EXAMINATION: General: 72 year old white male currently fully awake and interactive with family wants the ventilator out HEENT normocephalic atraumatic orally intubated Pulmonary: Diminished bases no accessory use with the exception of when placed on pressure support Cardiac: Regular rate and rhythm Abdomen: Acutely tender to palpation particularly in the right side hypoactive bowel sounds large inguinal hernia extending down into the scrotum Neuro: Awake, oriented, moves all extremities no localized weakness Extremities: Warm, dry, no significant edema  LABS:  BMET Recent Labs  Lab 11/19/2017 0707 11/20/2017 0722 11/12/17 0412  NA 139 140 140  K 5.0 4.9 4.8  CL 105 108 101  CO2 17*  --  21*  BUN 36* 37* 50*  CREATININE 4.60* 4.40* 5.13*  GLUCOSE 96 97 117*    Electrolytes Recent Labs  Lab 11/27/2017 0707 11/03/2017 0917 11/12/17 0412  CALCIUM 8.3*  --  6.2*  MG  --  1.8 1.5*  PHOS  --  3.3 5.1*    CBC Recent Labs  Lab 11/15/2017 0707 11/20/2017 0722 11/12/17 0412  WBC 14.2*  --  15.9*  HGB 14.3 14.3 12.7*  HCT 44.9 42.0 39.5  PLT 191  --  188    Coag's  Recent Labs  Lab 11-28-17 0917  INR 1.38    Sepsis Markers Recent Labs  Lab 2017-11-28 0857 2017-11-28 1015 2017/11/28 1518  LATICACIDVEN 4.95* 4.6* 4.5*    ABG Recent Labs  Lab 2017-11-28 1330 11/12/17 0524  PHART 7.282* 7.386  PCO2ART 46.0 41.6  PO2ART 97.7 70.4*    Liver Enzymes Recent Labs  Lab 28-Nov-2017 0707  AST 32  ALT 19  ALKPHOS 50  BILITOT 2.3*  ALBUMIN 2.5*    Cardiac Enzymes No results for input(s): TROPONINI, PROBNP in the last 168 hours.  Glucose Recent Labs  Lab 28-Nov-2017 1136 11/28/2017 1545 11-28-2017 1943 11/12/17 0101 11/12/17 0805  GLUCAP 163* 167*  149* 111* 104*    Imaging Ct Abdomen Pelvis Wo Contrast  Result Date: November 28, 2017 CLINICAL DATA:  Right upper quadrant pain, no fever, no elevation of white blood cell count EXAM: CT ABDOMEN AND PELVIS WITHOUT CONTRAST TECHNIQUE: Multidetector CT imaging of the abdomen and pelvis was performed following the standard protocol without IV contrast. COMPARISON:  None. FINDINGS: Lower chest: There is parenchymal opacity in both lung bases right greater than left some which could represent atelectasis. However pneumonia particularly in the right lower lobe is a definite consideration. The heart is mildly enlarged and pacer wires are present. Hepatobiliary: The liver is unremarkable in the unenhanced state. No calcified gallstones are seen. Pancreas: The pancreas is normal in size and the pancreatic duct is not dilated. Spleen: The spleen is unremarkable. Adrenals/Urinary Tract: Adrenal glands appear normal. No hydronephrosis is seen. There appears to be some atrophy of the left kidney. There is also a large calculus within the left ureteropelvic junction measuring 16 x 10 x 16 mm. This may have been present for some time in view of the area of parenchymal loss in the mid lower left kidney. The more distal ureters are normal in caliber. The urinary bladder is not well distended but no abnormality is seen. Stomach/Bowel: The stomach is moderately distended with fluid with no obvious abnormality. No definite small bowel obstruction is seen. The colon is largely decompressed and a portion of the colon extends into a left inguinal hernia without evidence of obstruction. Also, a portion of the right colon extends into a large right inguinal hernia containing both large and small bowel. Some edema of a loop of small bowel cannot be excluded within the right inguinal hernia. There is also some strandiness of the fat planes within the right inguinal hernia which may indicate fat necrosis or edema. The appendix appears normal  in caliber within the right inguinal hernia and the terminal ileum is very difficult to assess. Vascular/Lymphatic: The abdominal aorta is normal in caliber with moderate abdominal aortic atherosclerosis noted. No adenopathy is seen. Reproductive: Prostate is normal in size for age. Other: Bilateral large inguinal hernias are present right much larger than left as described above. On the right both large and small bowel extends into the hernia and some edema of a loop of small bowel within the right hernia cannot be excluded. Musculoskeletal: There is very mild retrolisthesis of L5 on S1 and L4 on L5 most likely due to degenerative change of the facet joints at those levels. There is degenerative disc disease most marked at L4-5 and L5-S1. The SI joints appear corticated IMPRESSION: 1. Large right inguinal hernia extending into the scrotum containing both large and small bowel. There may be some edema of the loop of small bowel within the hernia with some adjacent strandiness either representing edema or fat  necrosis. 2. There is a smaller left inguinal hernia extending into the scrotum containing a loop of the rectosigmoid colon without obstruction. 3. Opacity at both lung bases right greater than left. Possible atelectasis but cannot exclude pneumonia. 4. Large calculus within the left ureteral-pelvic junction of 16 x 10 x 16 mm. There may be some atrophy of a portion of the left kidney and this may be a longstanding process. No definite obstruction is seen currently. No other renal or ureteral calculi are noted. 5. Moderate abdominal aortic atherosclerosis. Electronically Signed   By: Dwyane Dee M.D.   On: 2017/11/29 11:15   Portable Chest Xray  Result Date: 11/12/2017 CLINICAL DATA:  72 year old male presenting with septic shock, intubated. EXAM: PORTABLE CHEST 1 VIEW COMPARISON:  Nov 29, 2017 and earlier. FINDINGS: Portable AP semi upright view at 0425 hours. Endotracheal tube tip in good position at the  level the clavicles. Enteric tube has been pulled back, the tip now projects at the distal thoracic esophagus and the side hole projects at the distal 3rd esophagus below the carina. Mildly lower lung volumes. Stable cardiac size and mediastinal contours. Stable left chest AICD. No pneumothorax. No consolidation identified. Pulmonary vascularity appears stable and within normal limits. No definite pleural effusion. IMPRESSION: 1. Enteric tube has been pulled back, side hole just below the carina. Advance 10-12 cm to place the side hole within the stomach. 2. Stable endotracheal tube. 3. Lower lung volumes, no definite acute cardiopulmonary abnormality. Electronically Signed   By: Odessa Fleming M.D.   On: 11/12/2017 07:17   STUDIES:  CT abd pelvis 4/11 1. Large right inguinal hernia extending into the scrotum containing both large and small bowel. There may be some edema of the loop of small bowel within the hernia with some adjacent strandiness either representing edema or fat necrosis.2. There is a smaller left inguinal hernia extending into the scrotum containing a loop of the rectosigmoid colon withoutobstruction. 3. Opacity at both lung bases right greater than left. Possible atelectasis but cannot exclude pneumonia. 4. Large calculus within the left ureteral-pelvic junction of 16 x 10 x 16 mm. There may be some atrophy of a portion of the left kidney and this may be a longstanding process. No definite obstruction is seen currently. No other renal or ureteral calculi are noted. 5. Moderate abdominal aortic atherosclerosis. Echo 4/11 >Wall thickness was increased in a pattern of mild LVH. Systolic   function was severely reduced. The estimated ejection fraction   was in the range of 25% to 30%. There is akinesis of the anteroseptal and apical myocardium. Doppler parameters are  consistent with abnormal left ventricular relaxation (grade 1  diastolic dysfunction). - Aortic root: The aortic root was mildly  dilated.  CULTURES: Blood 4/11 >  Urine 4/11 > Sputum 4/11 > few gpc and rare gm variable rods>>> RVP 4/11 >neg  ANTIBIOTICS: Azithromycin 4/11 > CTX 4/11 >  SIGNIFICANT EVENTS: 4/11 admit with septic shock  LINES/TUBES: ETT 4/11 > Femoral CVL 4/11 > Foley 4/11 >  DISCUSSION: 72 year old male with cardiac history presented 4/11 with abdominal pain and SOB. Hypotensive and hypoxic in ED requiring pressors and intubation. He was admitted to ICU. CT abd pending. Working dx septic shock with pulmonary vs intraabdominal source.  Favoring ischemic gut.  Renal function worse.  Shock parameters improved some.  Long discussion with the patient and his wife.  He is insistent that he would like the endotracheal tube out.  The only thing he is  requesting is a drink of water which I think we can oblige.  He understands removing the tube will likely hasten the dying process, however he also does not want to continue this level of support given the very poor likelihood of recovery we will honor his wishes, extubate him to nasal cannula, continue pressor support, bicarb infusion, and antibiotics.  I have added as needed morphine.  I would have a very low threshold to transitioning to morphine infusion should he have significant distress following extubation.  ASSESSMENT / PLAN:  Septic shock: favoring ischemic bowel and bacterial translocation as etiology; less likely pulmonary source  -blood cultures w/out growth -RVP neg, U strep neg -sputum w few gpc and rare gm variable rods -wife decided against surgery  -remains on high dose pressors Plan CVP goal 8-12 MAP goal >65 Wean pressors as able No further escalation   Chronic systolic HF w/ EF 35% CAD Plan Cont tele  rx shock   Acute hypoxemic respiratory failure ? PNA vs pulm edema Pcxr: R>L basilar atx some patchy changes on right. Aeration a little worse when comparing day before film Plan Extubate Suspect we will need morphine for  palliation O2 No BIPAP or re-intubation   Acute renal failure w/ lactic acidosis  -metabolic acidosis improved w/ bicarb infusion -creatinine and BUN rising. Decreased UOP -Not a HD candidate  Plan Cont bicarb F/u am chemistry   Fluid and electrolyte imbalance: hypomagnesemia Plan Replace and recheck   Acute metabolic encephalopathy  Improved.  Plan Supportive care  PAD RASS goal -1 PRN analgesia for abd pain   FAMILY  - Updates: Wife updated at length. Discussed intubation which she understood risks and is ok to proceed.   - Inter-disciplinary family meet or Palliative Care meeting due by:  4/18   Simonne Martinet ACNP-BC [facility] Pulmonary/Critical Care Pager # [phone] OR # [phone] if no answer  11/12/2017 9:56 AM

## 2017-11-12 NOTE — Plan of Care (Signed)
  Problem: Clinical Measurements: Goal: Ability to maintain clinical measurements within normal limits will improve Outcome: Progressing Note:  Very difficult to maintain patient BP with three pressers at this time. Patient on Vasopression, Levophed, and Neo. Continuing to monitor and titrate up as able.  Patient also oliguric.    Problem: Pain Managment: Goal: General experience of comfort will improve Outcome: Not Progressing Note:  Unable to increase patient's fentanyl d/t tenuous blood pressure.

## 2017-11-12 NOTE — Progress Notes (Signed)
  Echocardiogram 2D Echocardiogram has been performed.  [name] T Jackie Russman 11/12/2017, 9:56 AM

## 2017-11-12 NOTE — Progress Notes (Signed)
eLink Physician-Brief Progress Note Patient Name: [name] LucksHarvey R Redlich DOB: 06-Jul-1946 MRN: [id number]   Date of Service  11/12/2017  HPI/Events of Note  Calcium 6.2 Alb 2.5 correctied calcium is 7.4  eICU Interventions  1g calcuim gluconate     Intervention Category Intermediate Interventions: Electrolyte abnormality - evaluation and management  Kalman ShanMurali Dirk Vanaman 11/12/2017, 5:10 AM

## 2017-11-12 NOTE — Progress Notes (Signed)
CRITICAL VALUE ALERT  Critical Value:  Calcium 6.2  Date & Time Notied:  4/12 0500  Provider Notified: Dr. Marchelle Gearingamaswamy with [facility]  Orders Received/Actions taken: Calcium Gluconate 1G.   Noe GensStefanie A Aedan Geimer, RN

## 2017-11-13 DIAGNOSIS — N179 Acute kidney failure, unspecified: Secondary | ICD-10-CM

## 2017-11-13 DIAGNOSIS — A419 Sepsis, unspecified organism: Secondary | ICD-10-CM

## 2017-11-13 DIAGNOSIS — R6521 Severe sepsis with septic shock: Secondary | ICD-10-CM

## 2017-11-13 LAB — BASIC METABOLIC PANEL
ANION GAP: 15 (ref 5–15)
BUN: 62 mg/dL — ABNORMAL HIGH (ref 6–20)
CO2: 30 mmol/L (ref 22–32)
Calcium: 5.9 mg/dL — CL (ref 8.9–10.3)
Chloride: 92 mmol/L — ABNORMAL LOW (ref 101–111)
Creatinine, Ser: 4.5 mg/dL — ABNORMAL HIGH (ref 0.61–1.24)
GFR calc Af Amer: 14 mL/min — ABNORMAL LOW (ref >60)
GFR calc non Af Amer: 12 mL/min — ABNORMAL LOW (ref >60)
GLUCOSE: 165 mg/dL — AB (ref 65–99)
POTASSIUM: 4.4 mmol/L (ref 3.5–5.1)
Sodium: 137 mmol/L (ref 135–145)

## 2017-11-13 LAB — CBC
HEMATOCRIT: 37.7 % — AB (ref 39.0–52.0)
HEMOGLOBIN: 11.7 g/dL — AB (ref 13.0–17.0)
MCH: 29.6 pg (ref 26.0–34.0)
MCHC: 31 g/dL (ref 30.0–36.0)
MCV: 95.4 fL (ref 78.0–100.0)
Platelets: 114 10*3/uL — ABNORMAL LOW (ref 150–400)
RBC: 3.95 MIL/uL — AB (ref 4.22–5.81)
RDW: 14.1 % (ref 11.5–15.5)
WBC: 8.8 10*3/uL (ref 4.0–10.5)

## 2017-11-13 LAB — GLUCOSE, CAPILLARY: Glucose-Capillary: 158 mg/dL — ABNORMAL HIGH (ref 65–99)

## 2017-11-13 MED ORDER — MORPHINE 100MG IN NS 100ML (1MG/ML) PREMIX INFUSION
5.0000 mg/h | INTRAVENOUS | Status: DC
  Administered 2017-11-13: 5 mg/h via INTRAVENOUS
  Filled 2017-11-13: qty 100

## 2017-11-13 MED ORDER — FUROSEMIDE 10 MG/ML IJ SOLN
40.0000 mg | Freq: Once | INTRAMUSCULAR | Status: AC
  Administered 2017-11-13: 40 mg via INTRAVENOUS
  Filled 2017-11-13: qty 4

## 2017-11-14 LAB — CULTURE, RESPIRATORY W GRAM STAIN: Culture: NORMAL

## 2017-11-14 LAB — CULTURE, RESPIRATORY

## 2017-11-15 ENCOUNTER — Telehealth: Payer: Self-pay

## 2017-11-15 ENCOUNTER — Telehealth: Payer: Self-pay | Admitting: Cardiovascular Disease

## 2017-11-15 NOTE — Telephone Encounter (Signed)
On 11/15/17 I received a d/c from [employer] (original). The d/c is for cremation. The patient is a patient of Doctor Ramaswamy. The d/c will be taken to Pulmonary Unit for signature.  On 11/16/17 I received the d/c back from Doctor Craige CottaSood who signed the d/c for Doctor Ramaswamy. I got the d/c ready and called the funeral home to let them know the d/c is ready for pickup. I also faxed a copy to the funeral home per the funeral home request.

## 2017-11-16 LAB — CULTURE, BLOOD (ROUTINE X 2)
Culture: NO GROWTH
Culture: NO GROWTH
SPECIAL REQUESTS: ADEQUATE

## 2017-12-01 NOTE — Progress Notes (Signed)
CRITICAL VALUE ALERT  Critical Value:  Calcium 5.9  Date & Time Notied:  4/13 0525  Provider Notified: Dr. Katrinka BlazingSmith- [facility]  Orders Received/Actions taken: Will hold off on correction at this time.   Noe GensStefanie A Georgene Kopper, RN

## 2017-12-01 NOTE — Progress Notes (Signed)
100 ml of Fentanyl gtt wasted in sink. Witnessed by Kristeen Misshris Woodard, RN.   Noe GensStefanie A Rosie Torrez, RN

## 2017-12-01 NOTE — Progress Notes (Signed)
Patient with increased crackles in lungs, despite robinul. Upper airway secretions unable to be cleared by patient with decreased O2 Sat. Paged Elink to get order to NTS patient. After attempting to NTS, patient's saturation dropped from 83 to 75. After recovery after one pass of NTS, patient O2 Sat remains at 80. Per orders- patient not to be placed on Bipap, as part of his DNR code status. Spoke with respiratory therapy, and patient will be placed on heated high flow to keep saturation up.   Noe GensStefanie A Alika Saladin, RN

## 2017-12-01 NOTE — Plan of Care (Signed)
  Problem: Clinical Measurements: Goal: Respiratory complications will improve Outcome: Progressing Note:  Patient extubated on day shift 4/12 and placed on HFNC. Tolerating fairly well. O2 Saturation 84-88. And coarse crackles throughout lungs. Orders for robinul received on night shift to manage secretions.    Problem: Pain Managment: Goal: General experience of comfort will improve Outcome: Progressing Note:  Patient consistently states that he just can't get comfortable. Given dose of morphine 2mg  and patient slept for several hours. Will continue to monitor patient for pain/ comfort.

## 2017-12-01 NOTE — Progress Notes (Signed)
60 ml of Morphine IV wasted in sink with Psychologist, educationalheleigh RN

## 2017-12-01 NOTE — Progress Notes (Signed)
Pt was pronounced by Clarita CraneKeely RN and Sheleigh RN at 14:11 pm. Family at Bedside. Anders SimmondsPete Babcock NP made aware.

## 2017-12-01 NOTE — Progress Notes (Signed)
PULMONARY / CRITICAL CARE MEDICINE   Name: Boneta LucksHarvey R Mende MRN: [id number] DOB: Sep 23, 1945    ADMISSION DATE:  11/25/2017 CONSULTATION DATE:  4/11  REFERRING MD:  Dr. Criss AlvineGoldston  CHIEF COMPLAINT:  SOB  HISTORY OF PRESENT ILLNESS:     72 year old male with past medical history as below, which is significant for coronary artery disease, ischemic cardiomyopathy with left ventricular ejection fraction 35%, ventricular fibrillation status post ICD implantation, and macular degeneration.  He presented to [facility] emergency department on 4/11 with complaints of shortness of breath and right-sided abdominal pain which have been progressive over about a 1 week time.  Also complained of non-productive.  Abdominal pain is described as improving with bowel movement. Tremors have worsened as well. Denies nausea, vomiting, diarrhea.  Wife reports yellow and bloody stool. Upon arrival to the emergency department oxygen saturations were noted to be in the 70s on room air, improved with [place].  Notably he has been unable to take his Lasix for the last 2 weeks.  Chest x-ray did not describe any acute process, however, he is experiencing orthopnea in ED. He was also hypotensive in ED, which did not respond to volume. Volume in general was gentle (2L) given his cardiac history and him being apparently volume overloaded on exam. He was started on pressors and PCCM was asked to admit to ICU.    SUBJECTIVE:  Clinically worse  VITAL SIGNS: Blood Pressure (Abnormal) 111/57 (BP Location: Left Arm)   Pulse 85   Temperature 97.9 F (36.6 C) (Oral)   Respiration (Abnormal) 41   Height 5\' 11"  (1.803 m)   Weight 237 lb 3.4 oz (107.6 kg)   Oxygen Saturation 90%   Body Mass Index 33.08 kg/m   HEMODYNAMICS:    VENTILATOR SETTINGS: FiO2 (%):  [44 %-100 %] 100 %  INTAKE / OUTPUT:  Intake/Output Summary (Last 24 hours) at April 29, 2018 0831 Last data filed at April 29, 2018 0800 Gross per 24 hour  Intake 4685.72 ml   Output 555 ml  Net 4130.72 ml     PHYSICAL EXAMINATION: General: 72 year old white male currently fully awake and interactive with family wants the ventilator out General: 72 year old male patient acutely ill-appearing.  He is more encephalopathic HEENT: Normocephalic atraumatic mucous membranes are moist no jugular venous distention Pulmonary: Accessory use noted.  A little paradoxical this morning.  Diminished, scattered rales. Cardiac: Regular rate and rhythm Abdomen: Still exquisitely tender particularly on the right side.  Abdomen soft firm, hypoactive, inguinal hernia extends down into the scrotum Extremities: Mottled, cool, diaphoretic, weak pulses. Neuro: Little more confused today, generalized weakness, clinically declining all  LABS:  BMET Recent Labs  Lab 11/26/2017 0707 11/12/2017 0722 11/12/17 0412 01/29/18 0429  NA 139 140 140 137  K 5.0 4.9 4.8 4.4  CL 105 108 101 92*  CO2 17*  --  21* 30  BUN 36* 37* 50* 62*  CREATININE 4.60* 4.40* 5.13* 4.50*  GLUCOSE 96 97 117* 165*    Electrolytes Recent Labs  Lab 11/22/2017 0707 11/22/2017 0917 11/12/17 0412 01/29/18 0429  CALCIUM 8.3*  --  6.2* 5.9*  MG  --  1.8 1.5*  --   PHOS  --  3.3 5.1*  --     CBC Recent Labs  Lab 11/09/2017 0707 11/24/2017 0722 11/12/17 0412 01/29/18 0429  WBC 14.2*  --  15.9* 8.8  HGB 14.3 14.3 12.7* 11.7*  HCT 44.9 42.0 39.5 37.7*  PLT 191  --  188 114*    Coag's  Recent Labs  Lab 11/24/2017 0917  INR 1.38    Sepsis Markers Recent Labs  Lab 11/10/2017 0857 11/07/2017 1015 11/10/2017 1518  LATICACIDVEN 4.95* 4.6* 4.5*    ABG Recent Labs  Lab 11/08/2017 1330 11/12/17 0524  PHART 7.282* 7.386  PCO2ART 46.0 41.6  PO2ART 97.7 70.4*    Liver Enzymes Recent Labs  Lab 11/08/2017 0707  AST 32  ALT 19  ALKPHOS 50  BILITOT 2.3*  ALBUMIN 2.5*    Cardiac Enzymes No results for input(s): TROPONINI, PROBNP in the last 168 hours.  Glucose Recent Labs  Lab 11/01/2017 1136  11/28/2017 1545 11/01/2017 1943 11/12/17 0101 11/12/17 0805 20-Nov-2017 0737  GLUCAP 163* 167* 149* 111* 104* 158*    Imaging No results found. STUDIES:  CT abd pelvis 4/11 1. Large right inguinal hernia extending into the scrotum containing both large and small bowel. There may be some edema of the loop of small bowel within the hernia with some adjacent strandiness either representing edema or fat necrosis.2. There is a smaller left inguinal hernia extending into the scrotum containing a loop of the rectosigmoid colon withoutobstruction. 3. Opacity at both lung bases right greater than left. Possible atelectasis but cannot exclude pneumonia. 4. Large calculus within the left ureteral-pelvic junction of 16 x 10 x 16 mm. There may be some atrophy of a portion of the left kidney and this may be a longstanding process. No definite obstruction is seen currently. No other renal or ureteral calculi are noted. 5. Moderate abdominal aortic atherosclerosis. Echo 4/11 >Wall thickness was increased in a pattern of mild LVH. Systolic   function was severely reduced. The estimated ejection fraction   was in the range of 25% to 30%. There is akinesis of the anteroseptal and apical myocardium. Doppler parameters are  consistent with abnormal left ventricular relaxation (grade 1  diastolic dysfunction). - Aortic root: The aortic root was mildly dilated.  CULTURES: Blood 4/11 >  Urine 4/11 > Sputum 4/11 > few gpc and rare gm variable rods>>> RVP 4/11 >neg  ANTIBIOTICS: Azithromycin 4/11 > CTX 4/11 >  SIGNIFICANT EVENTS: 4/11 admit with septic shock  LINES/TUBES: ETT 4/11 > Femoral CVL 4/11 > Foley 4/11 >    ASSESSMENT / PLAN:  Septic shock: favoring ischemic bowel and bacterial translocation as etiology; less likely pulmonary source  Chronic systolic HF w/ EF 35% CAD Acute hypoxemic respiratory failure ? PNA vs pulm edema Acute renal failure w/ lactic acidosis  Fluid and electrolyte  imbalance: hypomagnesemia Acute metabolic encephalopathy   DISCUSSION: 72 year old male with cardiac history presented 4/11 with abdominal pain and SOB. Hypotensive and hypoxic in ED requiring pressors and intubation. He was admitted to ICU. CT abd pending. Working dx septic shock with pulmonary vs intraabdominal source.  Favoring ischemic gut.  We extubated him yesterday, with plans to not reintubate.  Discussed at length with his wife my concern about his underlying cardiomyopathy, and in the setting of septic shock and multiple organ failure I did not think he would survive long.  Over the course of the night his pressor requirements have improved, renal function is actually a little better, however he is now mottled, and work of breathing appears worse, he is also a little more encephalopathic.  I spoke about his current status at length with his wife who is at the bedside.  It is doubtful that he will survive the next 24-48 hours given his deterioration overnight.  He is requesting we remove the high flow  oxygen device that was started, I will respect that request.   Plan Continue supplemental oxygen but discontinue high flow.  No further titration of oxygen. Start morphine infusion Discontinue antibiotics KVO IV fluids Continue supportive care  Simonne Martinet ACNP-BC [facility] Pulmonary/Critical Care Pager # [phone] OR # [phone] if no answer    Nov 18, 2017 8:31 AM

## 2017-12-01 NOTE — Progress Notes (Signed)
Patient switched to heated HFNC due to low sats on [place] high flow.

## 2017-12-01 DEATH — deceased

## 2017-12-02 ENCOUNTER — Ambulatory Visit: Payer: Medicare Other | Admitting: Neurology

## 2017-12-16 ENCOUNTER — Ambulatory Visit: Payer: Medicare HMO | Admitting: Family Medicine

## 2018-01-01 NOTE — Discharge Summary (Signed)
DISCHARGE SUMMARY    Date of admit: 11/27/2017  6:46 AM Date of discharge: Dec 13, 2017  4:07 PM Length of Stay: 2 days  PCP is Patient, No Pcp Per   PROBLEM LIST Active Problems:   Septic shock (HCC)    SUMMARY DEANE WATTENBARGER was 72 y.o. patient with    has a past medical history of CAD (coronary artery disease), Carotid artery disease (HCC), Dyslipidemia, History of acute anterior wall MI (2009), Hypertension, Ischemic cardiomyopathy, Macular degeneration, Other vitamin B12 deficiency anemia (08/14/2013), Polyneuropathy in other diseases classified elsewhere (HCC) (02/12/2014), Tremor, essential (02/03/2016), and Ventricular fibrillation (HCC).   has a past surgical history that includes ICD medtronic implanted (07/06/08); Cardiac catheterization (09/12/2007); Cataract extraction (Bilateral); Cardiac catheterization (N/A, 03/26/2016); and ICD GENERATOR CHANGEOUT (N/A, 11/13/2016).   Admitted on 11/10/2017 with  72 year old male with past medical history as below, which is significant for coronary artery disease, ischemic cardiomyopathy with left ventricular ejection fraction 35%, ventricular fibrillation status post ICD implantation, and macular degeneration.  He presented to [facility] emergency department on 4/11 with complaints of shortness of breath and right-sided abdominal pain which have been progressive over about a 1 week time.  Also complained of non-productive.  Abdominal pain is described as improving with bowel movement. Tremors have worsened as well. Denies nausea, vomiting, diarrhea.  Wife reports yellow and bloody stool. Upon arrival to the emergency department oxygen saturations were noted to be in the 70s on room air, improved with [place].  Notably he has been unable to take his Lasix for the last 2 weeks.  Chest x-ray did not describe any acute process, however, he is experiencing orthopnea in ED. He was also hypotensive in ED, which did not respond to volume. Volume in  general was gentle (2L) given his cardiac history and him being apparently volume overloaded on exam. He was started on pressors and PCCM was asked to admit to ICU.   CTX 4/11 >  SIGNIFICANT EVENTS: 4/11 admit with septic shock ETT 4/11 > Femoral CVL 4/11 > Foley 4/11 >  4/13 - NP saw patient and noticed worsening encephalopathy. Mottling and increased work of breathing. Prognosis was felt to e < 48h. Discussion had with family about possibility of ischemic gut and  Worsening prognosis. Palliative care approach adopted and patient expired 12/13/2017       SIGNED Dr. Kalman Shan, M.D., F.C.C.P Pulmonary and Critical Care Medicine Staff Physician [facility] Pager: [phone], If no answer or between  15:00h - 7:00h: call [phone]  12/02/2017 8:52 AM

## 2019-09-01 NOTE — Telephone Encounter (Signed)
Close this encounter
